# Patient Record
Sex: Female | Born: 1945 | Race: White | Hispanic: No | Marital: Married | Smoking: Current every day smoker
Health system: Southern US, Community
[De-identification: ages and names within clinical notes are randomized; demographics above are authoritative.]

## PROBLEM LIST (undated history)

## (undated) DIAGNOSIS — K219 Gastro-esophageal reflux disease without esophagitis: Secondary | ICD-10-CM

## (undated) DIAGNOSIS — J449 Chronic obstructive pulmonary disease, unspecified: Secondary | ICD-10-CM

## (undated) DIAGNOSIS — J45909 Unspecified asthma, uncomplicated: Secondary | ICD-10-CM

## (undated) DIAGNOSIS — Z9889 Other specified postprocedural states: Secondary | ICD-10-CM

## (undated) DIAGNOSIS — R42 Dizziness and giddiness: Secondary | ICD-10-CM

## (undated) DIAGNOSIS — F419 Anxiety disorder, unspecified: Secondary | ICD-10-CM

## (undated) DIAGNOSIS — I10 Essential (primary) hypertension: Secondary | ICD-10-CM

## (undated) DIAGNOSIS — C50919 Malignant neoplasm of unspecified site of unspecified female breast: Secondary | ICD-10-CM

## (undated) DIAGNOSIS — E079 Disorder of thyroid, unspecified: Secondary | ICD-10-CM

## (undated) DIAGNOSIS — R112 Nausea with vomiting, unspecified: Secondary | ICD-10-CM

## (undated) HISTORY — PX: TUBAL LIGATION: SHX77

## (undated) HISTORY — DX: Malignant neoplasm of unspecified site of unspecified female breast: C50.919

## (undated) HISTORY — PX: OTHER SURGICAL HISTORY: SHX169

## (undated) HISTORY — DX: Chronic obstructive pulmonary disease, unspecified: J44.9

## (undated) HISTORY — PX: BREAST LUMPECTOMY: SHX2

---

## 1998-08-04 ENCOUNTER — Other Ambulatory Visit: Admission: RE | Admit: 1998-08-04 | Discharge: 1998-08-04 | Payer: Self-pay | Admitting: *Deleted

## 1998-09-04 ENCOUNTER — Ambulatory Visit (HOSPITAL_COMMUNITY): Admission: RE | Admit: 1998-09-04 | Discharge: 1998-09-04 | Payer: Self-pay | Admitting: General Surgery

## 1998-09-04 ENCOUNTER — Encounter: Payer: Self-pay | Admitting: General Surgery

## 1999-08-06 ENCOUNTER — Other Ambulatory Visit: Admission: RE | Admit: 1999-08-06 | Discharge: 1999-08-06 | Payer: Self-pay | Admitting: *Deleted

## 1999-09-13 ENCOUNTER — Encounter: Payer: Self-pay | Admitting: General Surgery

## 1999-09-13 ENCOUNTER — Ambulatory Visit (HOSPITAL_COMMUNITY): Admission: RE | Admit: 1999-09-13 | Discharge: 1999-09-13 | Payer: Self-pay | Admitting: General Surgery

## 2000-07-28 ENCOUNTER — Encounter: Payer: Self-pay | Admitting: Hematology & Oncology

## 2000-07-28 ENCOUNTER — Encounter: Admission: RE | Admit: 2000-07-28 | Discharge: 2000-07-28 | Payer: Self-pay | Admitting: Hematology & Oncology

## 2000-08-13 ENCOUNTER — Other Ambulatory Visit: Admission: RE | Admit: 2000-08-13 | Discharge: 2000-08-13 | Payer: Self-pay | Admitting: *Deleted

## 2000-09-16 ENCOUNTER — Encounter: Payer: Self-pay | Admitting: General Surgery

## 2000-09-16 ENCOUNTER — Encounter: Admission: RE | Admit: 2000-09-16 | Discharge: 2000-09-16 | Payer: Self-pay | Admitting: General Surgery

## 2001-08-14 ENCOUNTER — Other Ambulatory Visit: Admission: RE | Admit: 2001-08-14 | Discharge: 2001-08-14 | Payer: Self-pay | Admitting: *Deleted

## 2001-09-21 ENCOUNTER — Encounter: Admission: RE | Admit: 2001-09-21 | Discharge: 2001-09-21 | Payer: Self-pay | Admitting: General Surgery

## 2001-09-21 ENCOUNTER — Encounter: Payer: Self-pay | Admitting: General Surgery

## 2002-08-19 ENCOUNTER — Other Ambulatory Visit: Admission: RE | Admit: 2002-08-19 | Discharge: 2002-08-19 | Payer: Self-pay | Admitting: *Deleted

## 2002-11-01 ENCOUNTER — Encounter: Payer: Self-pay | Admitting: General Surgery

## 2002-11-01 ENCOUNTER — Encounter: Admission: RE | Admit: 2002-11-01 | Discharge: 2002-11-01 | Payer: Self-pay | Admitting: General Surgery

## 2003-08-24 ENCOUNTER — Other Ambulatory Visit: Admission: RE | Admit: 2003-08-24 | Discharge: 2003-08-24 | Payer: Self-pay | Admitting: *Deleted

## 2003-11-07 ENCOUNTER — Encounter: Admission: RE | Admit: 2003-11-07 | Discharge: 2003-11-07 | Payer: Self-pay | Admitting: General Surgery

## 2004-03-30 ENCOUNTER — Encounter: Admission: RE | Admit: 2004-03-30 | Discharge: 2004-03-30 | Payer: Self-pay | Admitting: *Deleted

## 2004-09-05 ENCOUNTER — Ambulatory Visit: Payer: Self-pay | Admitting: Hematology & Oncology

## 2004-12-14 ENCOUNTER — Encounter: Admission: RE | Admit: 2004-12-14 | Discharge: 2004-12-14 | Payer: Self-pay | Admitting: General Surgery

## 2005-06-07 ENCOUNTER — Ambulatory Visit (HOSPITAL_COMMUNITY): Admission: RE | Admit: 2005-06-07 | Discharge: 2005-06-07 | Payer: Self-pay | Admitting: *Deleted

## 2005-09-17 ENCOUNTER — Ambulatory Visit: Payer: Self-pay | Admitting: Hematology & Oncology

## 2005-09-23 ENCOUNTER — Other Ambulatory Visit: Admission: RE | Admit: 2005-09-23 | Discharge: 2005-09-23 | Payer: Self-pay | Admitting: Obstetrics and Gynecology

## 2005-12-20 ENCOUNTER — Ambulatory Visit (HOSPITAL_COMMUNITY): Admission: RE | Admit: 2005-12-20 | Discharge: 2005-12-20 | Payer: Self-pay | Admitting: Gastroenterology

## 2005-12-20 ENCOUNTER — Encounter (INDEPENDENT_AMBULATORY_CARE_PROVIDER_SITE_OTHER): Payer: Self-pay | Admitting: *Deleted

## 2005-12-26 ENCOUNTER — Encounter: Admission: RE | Admit: 2005-12-26 | Discharge: 2005-12-26 | Payer: Self-pay | Admitting: General Surgery

## 2006-12-30 ENCOUNTER — Encounter: Admission: RE | Admit: 2006-12-30 | Discharge: 2006-12-30 | Payer: Self-pay | Admitting: General Surgery

## 2007-06-04 ENCOUNTER — Other Ambulatory Visit: Admission: RE | Admit: 2007-06-04 | Discharge: 2007-06-04 | Payer: Self-pay | Admitting: Obstetrics & Gynecology

## 2007-12-31 ENCOUNTER — Encounter: Admission: RE | Admit: 2007-12-31 | Discharge: 2007-12-31 | Payer: Self-pay | Admitting: Obstetrics & Gynecology

## 2008-06-08 ENCOUNTER — Other Ambulatory Visit: Admission: RE | Admit: 2008-06-08 | Discharge: 2008-06-08 | Payer: Self-pay | Admitting: Obstetrics & Gynecology

## 2009-01-03 ENCOUNTER — Encounter: Admission: RE | Admit: 2009-01-03 | Discharge: 2009-01-03 | Payer: Self-pay | Admitting: Obstetrics & Gynecology

## 2009-01-09 ENCOUNTER — Encounter: Admission: RE | Admit: 2009-01-09 | Discharge: 2009-01-09 | Payer: Self-pay | Admitting: Obstetrics & Gynecology

## 2010-01-10 ENCOUNTER — Encounter: Admission: RE | Admit: 2010-01-10 | Discharge: 2010-01-10 | Payer: Self-pay | Admitting: Obstetrics & Gynecology

## 2010-08-02 ENCOUNTER — Encounter: Admission: RE | Admit: 2010-08-02 | Discharge: 2010-08-02 | Payer: Self-pay | Admitting: Obstetrics & Gynecology

## 2010-11-04 ENCOUNTER — Encounter: Payer: Self-pay | Admitting: Obstetrics & Gynecology

## 2011-01-22 ENCOUNTER — Other Ambulatory Visit: Payer: Self-pay | Admitting: Obstetrics & Gynecology

## 2011-01-22 DIAGNOSIS — Z1231 Encounter for screening mammogram for malignant neoplasm of breast: Secondary | ICD-10-CM

## 2011-01-29 ENCOUNTER — Ambulatory Visit
Admission: RE | Admit: 2011-01-29 | Discharge: 2011-01-29 | Disposition: A | Discharge status: Home / Self Care | Payer: BC Managed Care – PPO | Source: Ambulatory Visit | Attending: Obstetrics & Gynecology | Admitting: Obstetrics & Gynecology

## 2011-01-29 DIAGNOSIS — Z1231 Encounter for screening mammogram for malignant neoplasm of breast: Secondary | ICD-10-CM

## 2011-03-01 NOTE — Op Note (Signed)
Sara Mcclure, WESTRA NO.:  [id number]   MEDICAL RECORD NO.:  [id number]          PATIENT TYPE:  AMB   LOCATION:  ENDO                         FACILITY:  [facility]   PHYSICIAN:  Anselmo Rod, M.D.  DATE OF BIRTH:  02-Jan-1946   DATE OF PROCEDURE:  12/20/2005  DATE OF DISCHARGE:                                 OPERATIVE REPORT   PROCEDURE PERFORMED:  Colonoscopy with multiple snare polypectomies and cold  biopsies.   ENDOSCOPIST:  Anselmo Rod, M.D.   INSTRUMENT USED:  Olympus video colonoscope.   INDICATION FOR PROCEDURE:  This is a 65 year old white female with a  personal history of breast cancer and rectal bleeding, undergoing a  screening colonoscopy to rule out colon polyps, masses, etc.   PREPROCEDURE PREPARATION:  Informed consent was procured from the patient.  The patient was fasted for 4 hours prior to the procedure and prepped with  Osmoprep pills the night of and in the morning of the procedure.  Risks and  benefits of the procedure including a 10% miss rate of cancer in polyps was  discussed with the patient as well.   PREPROCEDURE PHYSICAL:  VITAL SIGNS:  The patient had stable vital signs.  NECK:  Supple.  CHEST:  Clear to auscultation.  CARDIAC:  S1 and S2 regular.  ABDOMEN:  Soft with normal bowel sounds.   DESCRIPTION OF PROCEDURE:  The patient was placed in the left lateral  decubitus position and sedated with 75 mcg of fentanyl and 6 mg of Versed in  slow incremental doses.  Once the patient was adequately sedated and  maintained on low-flow oxygen and continuous cardiac monitoring, the Olympus  video colonoscope was advanced from the rectum to the cecum.  The  appendiceal orifice and the ileocecal valve were visualized and  photographed.  The terminal ileum appeared healthy and without lesions.  Nine polyps were removed from the left colon, 5 of them by snare polypectomy  (hot snare) and 4 of them by cold biopsy.  One polyp was  snared from the  rectosigmoid colon.  Two of them were biopsied (cold biopsies) from the  rectosigmoid colon as well.  Small internal hemorrhoids were seen on  retroflexion.  There was no evidence of diverticulosis.  The patient  tolerated the procedure well without immediate complications.   IMPRESSION:  1.  Multiple left colon and rectosigmoid polyps removed (see description      above).  2.  No evidence of diverticulosis.  3.  Normal-appearing terminal ileum.  4.  Small internal hemorrhoids.   RECOMMENDATIONS:  1.  Continue on a high-fiber diet with liberal fluid intake.  2.  Await pathology results.  3.  Repeat colonoscopy, depending on pathology reports.  4.  Avoid all nonsteroidals including aspirin for the next 4 weeks.  5.  Outpatient followup as need arises in the future.      Anselmo Rod, M.D.  Electronically Signed     JNM/MEDQ  D:  12/20/2005  T:  12/21/2005  Job:  [id number]   cc:   Edwena Felty. Romine, M.D.  Fax:  [phone]   Lianne Bushy, M.D.  Fax: [phone]   Rose Phi. Myna Hidalgo, M.D.  Fax: [phone]

## 2011-08-13 ENCOUNTER — Other Ambulatory Visit: Payer: Self-pay | Admitting: Obstetrics & Gynecology

## 2011-08-13 DIAGNOSIS — M8580 Other specified disorders of bone density and structure, unspecified site: Secondary | ICD-10-CM

## 2011-08-21 ENCOUNTER — Ambulatory Visit
Admission: RE | Admit: 2011-08-21 | Discharge: 2011-08-21 | Disposition: A | Discharge status: Home / Self Care | Payer: BC Managed Care – PPO | Source: Ambulatory Visit | Attending: Obstetrics & Gynecology | Admitting: Obstetrics & Gynecology

## 2011-08-21 DIAGNOSIS — M8580 Other specified disorders of bone density and structure, unspecified site: Secondary | ICD-10-CM

## 2012-01-02 ENCOUNTER — Other Ambulatory Visit: Payer: Self-pay | Admitting: Obstetrics & Gynecology

## 2012-01-02 DIAGNOSIS — Z1231 Encounter for screening mammogram for malignant neoplasm of breast: Secondary | ICD-10-CM

## 2012-01-30 ENCOUNTER — Ambulatory Visit
Admission: RE | Admit: 2012-01-30 | Discharge: 2012-01-30 | Disposition: A | Discharge status: Home / Self Care | Payer: BC Managed Care – PPO | Source: Ambulatory Visit | Attending: Obstetrics & Gynecology | Admitting: Obstetrics & Gynecology

## 2012-01-30 DIAGNOSIS — Z1231 Encounter for screening mammogram for malignant neoplasm of breast: Secondary | ICD-10-CM

## 2012-04-13 ENCOUNTER — Encounter (HOSPITAL_COMMUNITY): Payer: Self-pay | Admitting: *Deleted

## 2012-04-13 ENCOUNTER — Emergency Department (HOSPITAL_COMMUNITY): Payer: BC Managed Care – PPO

## 2012-04-13 ENCOUNTER — Observation Stay (HOSPITAL_COMMUNITY)
Admission: EM | Admit: 2012-04-13 | Discharge: 2012-04-14 | DRG: 814 | Disposition: A | Discharge status: Home / Self Care | Payer: BC Managed Care – PPO | Attending: Internal Medicine | Admitting: Internal Medicine

## 2012-04-13 DIAGNOSIS — R110 Nausea: Secondary | ICD-10-CM | POA: Diagnosis present

## 2012-04-13 DIAGNOSIS — E119 Type 2 diabetes mellitus without complications: Secondary | ICD-10-CM | POA: Diagnosis present

## 2012-04-13 DIAGNOSIS — J45909 Unspecified asthma, uncomplicated: Secondary | ICD-10-CM | POA: Diagnosis present

## 2012-04-13 DIAGNOSIS — R42 Dizziness and giddiness: Secondary | ICD-10-CM | POA: Insufficient documentation

## 2012-04-13 DIAGNOSIS — M79609 Pain in unspecified limb: Secondary | ICD-10-CM | POA: Diagnosis present

## 2012-04-13 DIAGNOSIS — R079 Chest pain, unspecified: Secondary | ICD-10-CM

## 2012-04-13 DIAGNOSIS — F419 Anxiety disorder, unspecified: Secondary | ICD-10-CM | POA: Insufficient documentation

## 2012-04-13 DIAGNOSIS — R9439 Abnormal result of other cardiovascular function study: Secondary | ICD-10-CM | POA: Diagnosis present

## 2012-04-13 DIAGNOSIS — R109 Unspecified abdominal pain: Principal | ICD-10-CM | POA: Diagnosis present

## 2012-04-13 DIAGNOSIS — F17200 Nicotine dependence, unspecified, uncomplicated: Secondary | ICD-10-CM | POA: Diagnosis present

## 2012-04-13 DIAGNOSIS — Z720 Tobacco use: Secondary | ICD-10-CM | POA: Diagnosis present

## 2012-04-13 DIAGNOSIS — Z853 Personal history of malignant neoplasm of breast: Secondary | ICD-10-CM

## 2012-04-13 DIAGNOSIS — I2510 Atherosclerotic heart disease of native coronary artery without angina pectoris: Secondary | ICD-10-CM | POA: Diagnosis present

## 2012-04-13 DIAGNOSIS — R197 Diarrhea, unspecified: Secondary | ICD-10-CM | POA: Diagnosis present

## 2012-04-13 DIAGNOSIS — K219 Gastro-esophageal reflux disease without esophagitis: Secondary | ICD-10-CM | POA: Insufficient documentation

## 2012-04-13 DIAGNOSIS — I10 Essential (primary) hypertension: Secondary | ICD-10-CM | POA: Diagnosis present

## 2012-04-13 DIAGNOSIS — M79602 Pain in left arm: Secondary | ICD-10-CM | POA: Diagnosis present

## 2012-04-13 HISTORY — DX: Gastro-esophageal reflux disease without esophagitis: K21.9

## 2012-04-13 HISTORY — DX: Anxiety disorder, unspecified: F41.9

## 2012-04-13 HISTORY — DX: Dizziness and giddiness: R42

## 2012-04-13 HISTORY — DX: Unspecified asthma, uncomplicated: J45.909

## 2012-04-13 HISTORY — DX: Disorder of thyroid, unspecified: E07.9

## 2012-04-13 HISTORY — DX: Essential (primary) hypertension: I10

## 2012-04-13 LAB — GLUCOSE, CAPILLARY: Glucose-Capillary: 101 mg/dL — ABNORMAL HIGH (ref 70–99)

## 2012-04-13 LAB — COMPREHENSIVE METABOLIC PANEL
ALT: 20 U/L (ref 0–35)
AST: 19 U/L (ref 0–37)
CO2: 22 mEq/L (ref 19–32)
Chloride: 105 mEq/L (ref 96–112)
Creatinine, Ser: 0.65 mg/dL (ref 0.50–1.10)
GFR calc non Af Amer: >90 mL/min (ref >90)
Glucose, Bld: 113 mg/dL — ABNORMAL HIGH (ref 70–99)
Sodium: 141 mEq/L (ref 135–145)
Total Bilirubin: 0.4 mg/dL (ref 0.3–1.2)

## 2012-04-13 LAB — CBC WITH DIFFERENTIAL/PLATELET
Basophils Absolute: 0.1 10*3/uL (ref 0.0–0.1)
HCT: 43.7 % (ref 36.0–46.0)
Lymphocytes Relative: 17 % (ref 12–46)
Lymphs Abs: 2 10*3/uL (ref 0.7–4.0)
Monocytes Absolute: 0.9 10*3/uL (ref 0.1–1.0)
Neutro Abs: 8.8 10*3/uL — ABNORMAL HIGH (ref 1.7–7.7)
RBC: 5 MIL/uL (ref 3.87–5.11)
RDW: 13 % (ref 11.5–15.5)
WBC: 11.7 10*3/uL — ABNORMAL HIGH (ref 4.0–10.5)

## 2012-04-13 LAB — CARDIAC PANEL(CRET KIN+CKTOT+MB+TROPI)
CK, MB: 3.9 ng/mL (ref 0.3–4.0)
Relative Index: 2.5 (ref 0.0–2.5)
Total CK: 156 U/L (ref 7–177)

## 2012-04-13 LAB — LACTIC ACID, PLASMA: Lactic Acid, Venous: 0.9 mmol/L (ref 0.5–2.2)

## 2012-04-13 MED ORDER — SODIUM CHLORIDE 0.9 % IV SOLN
INTRAVENOUS | Status: DC
  Administered 2012-04-13 – 2012-04-14 (×2): via INTRAVENOUS

## 2012-04-13 MED ORDER — ONDANSETRON HCL 4 MG/2ML IJ SOLN
4.0000 mg | Freq: Once | INTRAMUSCULAR | Status: AC
  Administered 2012-04-13: 4 mg via INTRAVENOUS
  Filled 2012-04-13: qty 2

## 2012-04-13 MED ORDER — ENOXAPARIN SODIUM 80 MG/0.8ML ~~LOC~~ SOLN
1.0000 mg/kg | Freq: Two times a day (BID) | SUBCUTANEOUS | Status: DC
  Administered 2012-04-13: 70 mg via SUBCUTANEOUS
  Filled 2012-04-13 (×3): qty 0.8

## 2012-04-13 MED ORDER — NITROGLYCERIN 0.4 MG SL SUBL: 0.4000 mg | SUBLINGUAL_TABLET | SUBLINGUAL | Status: DC | PRN

## 2012-04-13 MED ORDER — ACETAMINOPHEN 325 MG PO TABS: 650.0000 mg | ORAL_TABLET | ORAL | Status: DC | PRN

## 2012-04-13 MED ORDER — ASPIRIN 81 MG PO CHEW
324.0000 mg | CHEWABLE_TABLET | ORAL | Status: AC
  Administered 2012-04-14: 324 mg via ORAL
  Filled 2012-04-13: qty 4

## 2012-04-13 MED ORDER — SODIUM CHLORIDE 0.9 % IJ SOLN: 3.0000 mL | INTRAMUSCULAR | Status: DC | PRN

## 2012-04-13 MED ORDER — PANTOPRAZOLE SODIUM 40 MG IV SOLR
40.0000 mg | Freq: Once | INTRAVENOUS | Status: AC
  Administered 2012-04-13: 40 mg via INTRAVENOUS
  Filled 2012-04-13: qty 40

## 2012-04-13 MED ORDER — SODIUM CHLORIDE 0.9 % IV SOLN: 250.0000 mL | INTRAVENOUS | Status: DC | PRN

## 2012-04-13 MED ORDER — ATORVASTATIN CALCIUM 10 MG PO TABS
10.0000 mg | ORAL_TABLET | Freq: Every day | ORAL | Status: DC
  Filled 2012-04-13 (×2): qty 1

## 2012-04-13 MED ORDER — METOPROLOL TARTRATE 12.5 MG HALF TABLET
12.5000 mg | ORAL_TABLET | Freq: Two times a day (BID) | ORAL | Status: DC
  Administered 2012-04-13 – 2012-04-14 (×2): 12.5 mg via ORAL
  Filled 2012-04-13 (×3): qty 1

## 2012-04-13 MED ORDER — ASPIRIN EC 81 MG PO TBEC
81.0000 mg | DELAYED_RELEASE_TABLET | Freq: Every day | ORAL | Status: DC
  Administered 2012-04-14: 81 mg via ORAL
  Filled 2012-04-13: qty 1

## 2012-04-13 MED ORDER — MONTELUKAST SODIUM 10 MG PO TABS
10.0000 mg | ORAL_TABLET | Freq: Every day | ORAL | Status: DC
  Administered 2012-04-13: 10 mg via ORAL
  Filled 2012-04-13 (×2): qty 1

## 2012-04-13 MED ORDER — FLUTICASONE-SALMETEROL 250-50 MCG/DOSE IN AEPB
1.0000 | INHALATION_SPRAY | Freq: Two times a day (BID) | RESPIRATORY_TRACT | Status: DC
  Administered 2012-04-13 – 2012-04-14 (×2): 1 via RESPIRATORY_TRACT
  Filled 2012-04-13: qty 14

## 2012-04-13 MED ORDER — LEVOTHYROXINE SODIUM 50 MCG PO TABS
50.0000 ug | ORAL_TABLET | Freq: Every day | ORAL | Status: DC
Start: 2012-04-14 — End: 2012-04-14
  Administered 2012-04-14: 50 ug via ORAL
  Filled 2012-04-13 (×2): qty 1

## 2012-04-13 MED ORDER — INSULIN ASPART 100 UNIT/ML ~~LOC~~ SOLN: 0.0000 [IU] | Freq: Three times a day (TID) | SUBCUTANEOUS | Status: DC

## 2012-04-13 MED ORDER — ONDANSETRON HCL 4 MG/2ML IJ SOLN
4.0000 mg | Freq: Four times a day (QID) | INTRAMUSCULAR | Status: DC | PRN
  Administered 2012-04-13 – 2012-04-14 (×2): 4 mg via INTRAVENOUS
  Filled 2012-04-13 (×2): qty 2

## 2012-04-13 MED ORDER — LISINOPRIL 10 MG PO TABS
10.0000 mg | ORAL_TABLET | Freq: Every day | ORAL | Status: DC
  Administered 2012-04-14: 10 mg via ORAL
  Filled 2012-04-13: qty 1

## 2012-04-13 MED ORDER — SODIUM CHLORIDE 0.9 % IJ SOLN: 3.0000 mL | Freq: Two times a day (BID) | INTRAMUSCULAR | Status: DC

## 2012-04-13 MED ORDER — VITAMIN D3 25 MCG (1000 UNIT) PO TABS
2000.0000 [IU] | ORAL_TABLET | Freq: Two times a day (BID) | ORAL | Status: DC
  Administered 2012-04-13 – 2012-04-14 (×2): 2000 [IU] via ORAL
  Filled 2012-04-13 (×3): qty 2

## 2012-04-13 MED ORDER — SODIUM CHLORIDE 0.9 % IV BOLUS (SEPSIS)
1000.0000 mL | Freq: Once | INTRAVENOUS | Status: AC
  Administered 2012-04-13: 1000 mL via INTRAVENOUS

## 2012-04-13 MED ORDER — IOHEXOL 300 MG/ML  SOLN: 20.0000 mL | INTRAMUSCULAR | Status: DC

## 2012-04-13 MED ORDER — IOHEXOL 350 MG/ML SOLN
100.0000 mL | Freq: Once | INTRAVENOUS | Status: AC | PRN
  Administered 2012-04-13: 100 mL via INTRAVENOUS

## 2012-04-13 NOTE — Consult Note (Signed)
Pt. Seen and examined. Agree with the NP/PA-C note as written.  66 yo female who is new to our practice. She had a stress test ordered by Dr. Ricki Miller (although she says she had no chest pain symptoms and a "normal" EKG in his office). This was a Bruce treadmill stress test which showed ~79mm of inferior ST depression with exercise. She was dyspneic during the stress test. She now presents with left arm pain and pain under both ribs. The abdomen is tender to the touch, there is nausea and diarrhea and she has an elevated WBC count but no fever.  There are no acute ischemic EKG changes. Although the stress test was technically positive, I'm not convinced this is unstable angina.  I would recommend further work-up and treatment of her abdominal pain. She should undergo a cardiac catheterization at some point, but not necessarily during this hospitalization if her cardiac enzymes are negative.  Will follow along as consultants during her hospitalization.  Chrystie Nose, MD, [facility] Attending Cardiologist [facility]

## 2012-04-13 NOTE — ED Provider Notes (Signed)
History     CSN: [id number]  Arrival date & time 04/13/12  1234   First MD Initiated Contact with Patient 04/13/12 1249      Chief Complaint  Patient presents with  . Abdominal Pain    (Consider location/radiation/quality/duration/timing/severity/associated sxs/prior treatment) HPI Comments: Patient is difficult historian presents with upper, pain is constant for the past 2-3 weeks that she attributes to reflux. Associated with belching and nausea but no vomiting. She denies any chest pain, shortness of breath or lower abdominal pain. EMS was called today because patient is experiencing pain in her left arm. She states she always has pain in her left upper arm which she used believes is secondary to previous radiation. She became concerned today when she had pain extending into her lower arm lasting for about an hour. It is now resolved. Notably she had a stress test 3 days ago but is not know her that the results. She reports no cardiac history and had no chest pain or shortness of breath. She denies any weakness, numbness, tingling or headache. She reports 3 episodes of loose stools in the past few days which is normally constipated.  The history is provided by the patient and the EMS personnel.    Past Medical History  Diagnosis Date  . GERD (gastroesophageal reflux disease)   . Asthma   . Hypertension   . Diabetes mellitus   . Thyroid disease   . Anxiety   . Vertigo     History reviewed. No pertinent past surgical history.  History reviewed. No pertinent family history.  History  Substance Use Topics  . Smoking status: Current Everyday Smoker -- 1.0 packs/day for 50 years    Types: Cigarettes  . Smokeless tobacco: Not on file  . Alcohol Use: No    OB History    Grav Para Term Preterm Abortions TAB SAB Ect Mult Living                  Review of Systems  Constitutional: Positive for activity change and appetite change. Negative for fever.  HENT: Negative for  congestion and rhinorrhea.   Respiratory: Negative for cough, chest tightness and shortness of breath.   Cardiovascular: Negative for chest pain.  Gastrointestinal: Positive for nausea, abdominal pain and diarrhea. Negative for vomiting and constipation.  Genitourinary: Negative for dysuria and hematuria.  Musculoskeletal: Positive for myalgias and arthralgias.  Skin: Negative for rash.  Neurological: Positive for weakness. Negative for dizziness and headaches.    Allergies  Review of patient's allergies indicates not on file.  Home Medications   Current Outpatient Rx  Name Route Sig Dispense Refill  . ASPIRIN EC 81 MG PO TBEC Oral Take 81 mg by mouth daily.    Marland Kitchen VITAMIN D 1000 UNITS PO TABS Oral Take 2,000 Units by mouth 2 (two) times daily.    Marland Kitchen FLUTICASONE-SALMETEROL 250-50 MCG/DOSE IN AEPB Inhalation Inhale 1 puff into the lungs every 12 (twelve) hours.    . FOSINOPRIL SODIUM 10 MG PO TABS Oral Take 10 mg by mouth daily.    Marland Kitchen LEVOTHYROXINE SODIUM 50 MCG PO TABS Oral Take 50 mcg by mouth daily.    Marland Kitchen METFORMIN HCL 500 MG PO TABS Oral Take 500 mg by mouth every evening.    Marland Kitchen MONTELUKAST SODIUM 10 MG PO TABS Oral Take 10 mg by mouth at bedtime.      BP 142/80  Temp 98.4 F (36.9 C) (Oral)  Resp 20  SpO2 97%  Physical  Exam  Constitutional: She is oriented to person, place, and time. She appears well-developed and well-nourished. No distress.  HENT:  Head: Normocephalic and atraumatic.  Mouth/Throat: Oropharynx is clear and moist.  Eyes: Conjunctivae are normal. Pupils are equal, round, and reactive to light.  Neck: Normal range of motion. Neck supple.  Cardiovascular: Normal rate, regular rhythm and normal heart sounds.   No murmur heard. Pulmonary/Chest: Effort normal and breath sounds normal. No respiratory distress.  Abdominal: Soft. There is tenderness. There is no rebound and no guarding.       Tender To palpation left upper quadrant, epigastric area, right upper  quadrant  Musculoskeletal: Normal range of motion. She exhibits no edema and no tenderness.       FROM L arm with pain, no cellulitis.  Neurological: She is alert and oriented to person, place, and time. No cranial nerve deficit.  Skin: Skin is warm.    ED Course  Procedures (including critical care time)  Labs Reviewed  CBC WITH DIFFERENTIAL - Abnormal; Notable for the following:    WBC 11.7 (*)     Hemoglobin 15.1 (*)     Neutro Abs 8.8 (*)     All other components within normal limits  COMPREHENSIVE METABOLIC PANEL - Abnormal; Notable for the following:    Glucose, Bld 113 (*)     Calcium 10.6 (*)     All other components within normal limits  CARDIAC PANEL(CRET KIN+CKTOT+MB+TROPI) - Abnormal; Notable for the following:    Relative Index 2.8 (*)     All other components within normal limits  LIPASE, BLOOD  LACTIC ACID, PLASMA   No results found.   No diagnosis found.    MDM  Upper abdominal pain with nausea and diarrhea. This is a chronic issue for the patient. EMS was called for her left upper extremity pain which is now resolved.  Discussed with [facility] who found patient's stress test and it was determined to be abnormal.  Concerning for angina equivalent in setting of L arm pain. [facility] will consult on patient.  Upper abdominal pain and nausea appear to be chronic issue for patient.  Consider gastritis, GERD, PUD, pancreatitis. Will obtain abdominal labs and CT.  More concerning is patient's arm pain and abnormal stress test.  She will need cardiac workup. No acute abnormality on CT.   Date: 04/13/2012  Rate: 83  Rhythm: normal sinus rhythm  QRS Axis: normal  Intervals: normal  ST/T Wave abnormalities: normal  Conduction Disutrbances:none  Narrative Interpretation:   Old EKG Reviewed: none available       Glynn Octave, MD 04/13/12 1857

## 2012-04-13 NOTE — ED Notes (Signed)
IV site dressing changed at pt request. Pt appears anxious and symptoms increase when family in room. Pt advised of CT results.

## 2012-04-13 NOTE — ED Notes (Signed)
Heart Healthy diet ordered spoke with Phylicia

## 2012-04-13 NOTE — ED Notes (Signed)
Pt giving medication list and last taken to pharmacy tech at this time. Pt has hx of anxiety and does appear anxious at this time. Pt states she was called by her PCP on Friday she was diabetic and was started on Metformin BID that day. Pt reports taking Prevacid for GERD over the last week. She went to PCP on Wednesday for the abd pain and was told her s/s were consistent with her GERD. Pt does have relief with her Prevacid. Pt denies vomiting since 2 weeks ago. Pt has hx of vertigo and reports last vomiting was due to car sickness. Pt husband states pt is having more reflux than usual.

## 2012-04-13 NOTE — Consult Note (Signed)
Reason for Consult:  Left arm pain Referring Physician:   ANE CONERLY is an 66 y.o. female.  HPI:   Patient is a 66 year old obese Caucasian female with history of breast cancer and radiation therapy 17 years ago, diabetes mellitus new onset, asthma, hypertension, hyperlipidemia, hypothyroidism, continued tobacco abuse 50 pack year history.  Last Friday on 04/10/2012 patient patient underwent a treadmill exercise stress test using Bruce Bruce protocol. The test was positive. 2 nonspecific ST and T wave changes as well as dyspnea and LVH. In the test was further recommended to delineate the cause of her dyspnea.    The patient presents today with left arm pain as well as upper abdominal pressure. She states she's had abdominal pressure now for 2 weeks. She rates it as a 6/10. She states completely eases off when she sits straight up or stands up.  The abdominal pain is worse after eating.  She also reports acid reflux, diaphoresis and chills over the weekend as well as rapid heart rate, dizziness and nausea, shortness of breath as well as diarrhea over the weekend.  She denies cough, congestion, lower extremity edema, dysuriaa hematuriaa hematochezia, melena.  Past Medical History  Diagnosis Date  . GERD (gastroesophageal reflux disease)   . Asthma   . Hypertension   . Diabetes mellitus   . Thyroid disease   . Anxiety   . Vertigo     History reviewed. No pertinent past surgical history.  History reviewed. No pertinent family history.  Social History:  reports that she has been smoking Cigarettes.  She has a 50 pack-year smoking history. She does not have any smokeless tobacco history on file. She reports that she does not drink alcohol or use illicit drugs.  Allergies: Not on File  Medications:   Aspirin 81 mg daily, vitamin D 2000 units twice daily, Advair 250/50 mcg per dose one puff every 12 hours, Monopril 10 mg daily, levothyroxine 50 mcg daily, metformin 500 mg every evening,  Singulair 10 mg at bedtime.  Results for orders placed during the hospital encounter of 04/13/12 (from the past 48 hour(s))  CBC WITH DIFFERENTIAL     Status: Abnormal   Collection Time   04/13/12  1:28 PM      Component Value Range Comment   WBC 11.7 (*) 4.0 - 10.5 K/uL    RBC 5.00  3.87 - 5.11 MIL/uL    Hemoglobin 15.1 (*) 12.0 - 15.0 g/dL    HCT 16.1  09.6 - 04.5 %    MCV 87.4  78.0 - 100.0 fL    MCH 30.2  26.0 - 34.0 pg    MCHC 34.6  30.0 - 36.0 g/dL    RDW 40.9  81.1 - 91.4 %    Platelets 213  150 - 400 K/uL    Neutrophils Relative 75  43 - 77 %    Neutro Abs 8.8 (*) 1.7 - 7.7 K/uL    Lymphocytes Relative 17  12 - 46 %    Lymphs Abs 2.0  0.7 - 4.0 K/uL    Monocytes Relative 7  3 - 12 %    Monocytes Absolute 0.9  0.1 - 1.0 K/uL    Eosinophils Relative 0  0 - 5 %    Eosinophils Absolute 0.1  0.0 - 0.7 K/uL    Basophils Relative 0  0 - 1 %    Basophils Absolute 0.1  0.0 - 0.1 K/uL   COMPREHENSIVE METABOLIC PANEL     Status:  Abnormal   Collection Time   04/13/12  1:28 PM      Component Value Range Comment   Sodium 141  135 - 145 mEq/L    Potassium 3.5  3.5 - 5.1 mEq/L    Chloride 105  96 - 112 mEq/L    CO2 22  19 - 32 mEq/L    Glucose, Bld 113 (*) 70 - 99 mg/dL    BUN 7  6 - 23 mg/dL    Creatinine, Ser 4.09  0.50 - 1.10 mg/dL    Calcium 81.1 (*) 8.4 - 10.5 mg/dL    Total Protein 7.2  6.0 - 8.3 g/dL    Albumin 3.9  3.5 - 5.2 g/dL    AST 19  0 - 37 U/L    ALT 20  0 - 35 U/L    Alkaline Phosphatase 72  39 - 117 U/L    Total Bilirubin 0.4  0.3 - 1.2 mg/dL    GFR calc non Af Amer >90  >90 mL/min    GFR calc Af Amer >90  >90 mL/min   CARDIAC PANEL(CRET KIN+CKTOT+MB+TROPI)     Status: Abnormal   Collection Time   04/13/12  1:28 PM      Component Value Range Comment   Total CK 109  7 - 177 U/L    CK, MB 3.1  0.3 - 4.0 ng/mL    Troponin I <0.30  <0.30 ng/mL    Relative Index 2.8 (*) 0.0 - 2.5   LIPASE, BLOOD     Status: Normal   Collection Time   04/13/12  1:28 PM       Component Value Range Comment   Lipase 27  11 - 59 U/L   LACTIC ACID, PLASMA     Status: Normal   Collection Time   04/13/12  1:28 PM      Component Value Range Comment   Lactic Acid, Venous 0.9  0.5 - 2.2 mmol/L     No results found.  Review of Systems  Constitutional: Positive for chills and diaphoresis. Negative for fever.  HENT: Negative for congestion and sore throat.   Respiratory: Positive for shortness of breath. Negative for cough and wheezing.   Cardiovascular: Positive for palpitations and claudication. Negative for chest pain, orthopnea, leg swelling and PND.  Gastrointestinal: Positive for nausea, vomiting, abdominal pain and diarrhea. Negative for constipation, blood in stool and melena.  Genitourinary: Negative for dysuria and hematuria.  Musculoskeletal: Positive for myalgias (Left forearm pain.).  Neurological: Negative for dizziness.   Blood pressure 142/80, temperature 98.4 F (36.9 C), temperature source Oral, resp. rate 20, SpO2 97.00%. Physical Exam  Constitutional: She is oriented to person, place, and time. She appears well-developed. No distress.  HENT:  Head: Normocephalic and atraumatic.  Mouth/Throat: No oropharyngeal exudate.  Eyes: EOM are normal. Pupils are equal, round, and reactive to light. No scleral icterus.  Neck: Normal range of motion. Neck supple. No JVD present.  Cardiovascular: Normal rate and regular rhythm.   No murmur heard. Pulses:      Carotid pulses are on the left side with bruit.      Radial pulses are 2+ on the right side, and 2+ on the left side.       Dorsalis pedis pulses are 2+ on the right side, and 2+ on the left side.       No femoral bruit.  Respiratory: Effort normal.       Course BS which cleared with cough.  GI: Soft. There is tenderness (6/10 RUQ pain. and 8/10 epigastric to LUQ pain ).  Musculoskeletal: She exhibits no edema.       Left arm is nontender with palpation or passive and active motion.    Lymphadenopathy:    She has no cervical adenopathy.  Neurological: She is alert and oriented to person, place, and time. She exhibits normal muscle tone.  Skin: Skin is warm and dry.  Psychiatric: She has a normal mood and affect.    Assessment/Plan: Patient Active Hospital Problem List:  Abdominal pain (04/13/2012) Positive cardiac stress test, 04/10/12 (04/13/2012) Left arm pain, forearm (04/13/2012) Tobacco abuse (04/13/2012) HTN (hypertension) (04/13/2012) Diabetes mellitus (04/13/2012) History of breast cancer: 17 yrs ago. (04/13/2012)  Plan:  The patient will need a left heart cath after her abdominal pain has been worked up.  She had a recent positive Bruce protocol stress test and is a longtime smoker.  She may have cholecystitis/gall stone pancreatitis.  Recommend lipase and amylase.  CT abdomen and pelvis and CTA of chest are pending.  Giving the contrast load, we need to let her kidneys cool off prior to cath.   Quentyn Kolbeck 04/13/2012, 3:38 PM

## 2012-04-13 NOTE — ED Provider Notes (Signed)
  Physical Exam  BP 130/75  Pulse 84  Temp 98.4 F (36.9 C) (Oral)  Resp 20  SpO2 96%  Physical Exam  ED Course  Procedures  MDM Received patient in signout pending results of CTs. CTs were negative. Patient will be admitted by cardiology for heart cath tomorrow.      Juliet Rude. Rubin Payor, MD 04/13/12 1842

## 2012-04-13 NOTE — ED Notes (Signed)
C/o intermittent LUQ x 2 weeks, past 3 days reports n/v/d. Given zofran 4mg  IV by EMS enroute

## 2012-04-14 ENCOUNTER — Encounter (HOSPITAL_COMMUNITY)
Admission: EM | Disposition: A | Discharge status: Home / Self Care | Payer: Self-pay | Source: Home / Self Care | Attending: Emergency Medicine

## 2012-04-14 HISTORY — PX: LEFT HEART CATHETERIZATION WITH CORONARY ANGIOGRAM: SHX5451

## 2012-04-14 HISTORY — PX: ABDOMINAL ANGIOGRAM: SHX5499

## 2012-04-14 LAB — GLUCOSE, CAPILLARY
Glucose-Capillary: 96 mg/dL (ref 70–99)
Glucose-Capillary: 91 mg/dL (ref 70–99)
Glucose-Capillary: 82 mg/dL (ref 70–99)

## 2012-04-14 LAB — BASIC METABOLIC PANEL
Chloride: 109 mEq/L (ref 96–112)
Creatinine, Ser: 0.68 mg/dL (ref 0.50–1.10)
GFR calc Af Amer: >90 mL/min (ref >90)
GFR calc non Af Amer: 90 mL/min — ABNORMAL LOW (ref >90)
Potassium: 3.9 mEq/L (ref 3.5–5.1)

## 2012-04-14 LAB — LIPID PANEL
Cholesterol: 202 mg/dL — ABNORMAL HIGH (ref 0–200)
Triglycerides: 118 mg/dL (ref <150)

## 2012-04-14 LAB — CARDIAC PANEL(CRET KIN+CKTOT+MB+TROPI)
Relative Index: 2.1 (ref 0.0–2.5)
Total CK: 155 U/L (ref 7–177)
Total CK: 174 U/L (ref 7–177)
Troponin I: <0.3 ng/mL (ref <0.30)

## 2012-04-14 LAB — TSH: TSH: 1.313 u[IU]/mL (ref 0.350–4.500)

## 2012-04-14 LAB — PROTIME-INR: Prothrombin Time: 13.9 seconds (ref 11.6–15.2)

## 2012-04-14 SURGERY — LEFT HEART CATHETERIZATION WITH CORONARY ANGIOGRAM
Anesthesia: LOCAL

## 2012-04-14 MED ORDER — ONDANSETRON HCL 4 MG/2ML IJ SOLN: 4.0000 mg | Freq: Four times a day (QID) | INTRAMUSCULAR | Status: DC | PRN

## 2012-04-14 MED ORDER — MIDAZOLAM HCL 2 MG/2ML IJ SOLN
INTRAMUSCULAR | Status: AC
  Filled 2012-04-14: qty 2

## 2012-04-14 MED ORDER — LIDOCAINE HCL (PF) 1 % IJ SOLN
INTRAMUSCULAR | Status: AC
  Filled 2012-04-14: qty 30

## 2012-04-14 MED ORDER — ACETAMINOPHEN 325 MG PO TABS: 650.0000 mg | ORAL_TABLET | ORAL | Status: DC | PRN

## 2012-04-14 MED ORDER — METFORMIN HCL 500 MG PO TABS: 500.0000 mg | ORAL_TABLET | Freq: Every evening | ORAL | Status: DC

## 2012-04-14 MED ORDER — FENTANYL CITRATE 0.05 MG/ML IJ SOLN
INTRAMUSCULAR | Status: AC
  Filled 2012-04-14: qty 2

## 2012-04-14 MED ORDER — ATORVASTATIN CALCIUM 20 MG PO TABS: 20.0000 mg | ORAL_TABLET | Freq: Every day | ORAL | Status: DC

## 2012-04-14 MED ORDER — ATORVASTATIN CALCIUM 20 MG PO TABS
20.0000 mg | ORAL_TABLET | Freq: Every day | ORAL | Status: DC
  Filled 2012-04-14: qty 1

## 2012-04-14 MED ORDER — NITROGLYCERIN 0.2 MG/ML ON CALL CATH LAB
INTRAVENOUS | Status: AC
  Filled 2012-04-14: qty 1

## 2012-04-14 MED ORDER — SODIUM CHLORIDE 0.9 % IV SOLN: INTRAVENOUS | Status: DC

## 2012-04-14 MED ORDER — HEPARIN (PORCINE) IN NACL 2-0.9 UNIT/ML-% IJ SOLN
INTRAMUSCULAR | Status: AC
  Filled 2012-04-14: qty 2000

## 2012-04-14 MED ORDER — OMEPRAZOLE 20 MG PO CPDR: 20.0000 mg | DELAYED_RELEASE_CAPSULE | Freq: Every day | ORAL | Status: DC

## 2012-04-14 NOTE — CV Procedure (Signed)
Sara Mcclure is a 66 y.o. female    [id number]  [id number] LOCATION:  FACILITY: [facility]  PHYSICIAN: Nanetta Batty, M.D. [birth year]   DATE OF PROCEDURE:  04/14/2012  DATE OF DISCHARGE:  [facility]  CARDIAC CATHETERIZATION     History obtained from the patient.   PROCEDURE DESCRIPTION:   The patient was brought to the second floor  [facility]iac cath lab in the postabsorptive state. She was premedicated with 2 mg versed and 25 micrograms of fentanyl. Her right femoral artery was prepped and shaved in usual sterile fashion. Xylocaine 1% was used  for local anesthesia. A 5  French sheath was inserted into the Right FA. 5 Fr JL4 and JR4 and pigtail catheters were used.    HEMODYNAMICS:    AO SYSTOLIC/AO DIASTOLIC: 128/60   LV SYSTOLIC/LV DIASTOLIC: 128/13  ANGIOGRAPHIC RESULTS:   1. Left main: Nl  2. LAD: mild luminal irregularity of 10 to <20% proximally 3. Left circumflex: Nl  4. Right coronary artery: Nl dominant vessel     5 .  Left ventriculography; RAO left ventriculogram was performed using  25 mL of Visipaque dye at 12 mL/second. The overall LVEF estimated  60 - 65 % Without wall motion abnormalities.  6.  Distal AortographyL: 10 - 20% right renal artery narrowing; mild luminal irregularity distal aorta.    IMPRESSION:  Normal LV function                           Very mild nonobstructive CAD.  REC:  Medical therapy with smoking cessation, lifestyle modication.  Pt tolerated well.  Lennette Bihari, MD, [facility] 04/14/2012 2:10 PM

## 2012-04-14 NOTE — Care Management Note (Unsigned)
    Page 1 of 1   04/14/2012     11:34:15 AM   CARE MANAGEMENT NOTE 04/14/2012  Patient:  Sara Mcclure, Sara Mcclure   Account Number:  [id number]  Date Initiated:  04/14/2012  Documentation initiated by:  SIMMONS,Tyrisha Benninger  Subjective/Objective Assessment:   ADMITTED WITH N/V/ LEFT ARM PAIN; LIVES AT HOME WITH HUSBAND; WAS IPTA.     Action/Plan:   DISCHARGE PLANNING.   Anticipated DC Date:  04/15/2012   Anticipated DC Plan:  HOME/SELF CARE      DC Planning Services  CM consult      Choice offered to / List presented to:             Status of service:  In process, will continue to follow Medicare Important Message given?   (If response is "NO", the following Medicare IM given date fields will be blank) Date Medicare IM given:   Date Additional Medicare IM given:    Discharge Disposition:    Per UR Regulation:  Reviewed for med. necessity/level of care/duration of stay  If discussed at Long Length of Stay Meetings, dates discussed:    Comments:  04/14/12  1133  Steward Sames SIMMONS RN, BSN [phone] FOR CARDIAC CATH TODAY.  NCM WILL FOLLOW.

## 2012-04-14 NOTE — Discharge Summary (Signed)
Patient ID: Sara Mcclure,  MRN: [id number], DOB/AGE: 1946-01-08 66 y.o.  Admit date: 04/13/2012 Discharge date: 04/14/2012  Primary Care Provider: Dr Ricki Miller Primary Cardiologist: Dr Rennis Golden  Discharge Diagnoses Principal Problem:  *Abdominal pain Active Problems:  Positive cardiac stress test, 04/10/12  Left arm pain, forearm  Tobacco abuse  HTN (hypertension)  Diabetes mellitus  History of breast cancer: 17 yrs ago.  Minor CAD, (10-20% LAD) at cath 04/14/12   Procedures: Cardiac cath 04/14/12   Hospital Course: 66 yo female who is new to our practice. She had a stress test ordered by Dr. Ricki Miller (although she says she had no chest pain symptoms and a "normal" EKG in his office). This was a Bruce treadmill stress test which showed ~61mm of inferior ST depression with exercise. She was dyspneic during the stress test. She now presents with left arm pain and pain under both ribs. The abdomen is tender to the touch, there is nausea and diarrhea and she has an elevated WBC count but no fever. There are no acute ischemic EKG changes. Although the stress test was technically positive, Dr Rennis Golden was not convinced this was unstable angina. He  recommended further work-up and treatment of her abdominal pain. She underwent diagnostic cath 04/14/12 which showed minor CAD. Dr Tresa Endo felt she could be discharge late on the 2nd. She will see Wilburt Finlay in a week. PPI was added.   Discharge Vitals:  Blood pressure 135/76, pulse 62, temperature 98.2 F (36.8 C), temperature source Oral, resp. rate 20, height 5' 0.6" (1.539 m), weight 72 kg (158 lb 11.7 oz), SpO2 96.00%.    Labs: Results for orders placed during the hospital encounter of 04/13/12 (from the past 48 hour(s))  CBC WITH DIFFERENTIAL     Status: Abnormal   Collection Time   04/13/12  1:28 PM      Component Value Range Comment   WBC 11.7 (*) 4.0 - 10.5 K/uL    RBC 5.00  3.87 - 5.11 MIL/uL    Hemoglobin 15.1 (*) 12.0 - 15.0 g/dL    HCT 40.9  81.1 -  91.4 %    MCV 87.4  78.0 - 100.0 fL    MCH 30.2  26.0 - 34.0 pg    MCHC 34.6  30.0 - 36.0 g/dL    RDW 78.2  95.6 - 21.3 %    Platelets 213  150 - 400 K/uL    Neutrophils Relative 75  43 - 77 %    Neutro Abs 8.8 (*) 1.7 - 7.7 K/uL    Lymphocytes Relative 17  12 - 46 %    Lymphs Abs 2.0  0.7 - 4.0 K/uL    Monocytes Relative 7  3 - 12 %    Monocytes Absolute 0.9  0.1 - 1.0 K/uL    Eosinophils Relative 0  0 - 5 %    Eosinophils Absolute 0.1  0.0 - 0.7 K/uL    Basophils Relative 0  0 - 1 %    Basophils Absolute 0.1  0.0 - 0.1 K/uL   COMPREHENSIVE METABOLIC PANEL     Status: Abnormal   Collection Time   04/13/12  1:28 PM      Component Value Range Comment   Sodium 141  135 - 145 mEq/L    Potassium 3.5  3.5 - 5.1 mEq/L    Chloride 105  96 - 112 mEq/L    CO2 22  19 - 32 mEq/L    Glucose, Bld 113 (*)  70 - 99 mg/dL    BUN 7  6 - 23 mg/dL    Creatinine, Ser 2.95  0.50 - 1.10 mg/dL    Calcium 28.4 (*) 8.4 - 10.5 mg/dL    Total Protein 7.2  6.0 - 8.3 g/dL    Albumin 3.9  3.5 - 5.2 g/dL    AST 19  0 - 37 U/L    ALT 20  0 - 35 U/L    Alkaline Phosphatase 72  39 - 117 U/L    Total Bilirubin 0.4  0.3 - 1.2 mg/dL    GFR calc non Af Amer >90  >90 mL/min    GFR calc Af Amer >90  >90 mL/min   CARDIAC PANEL(CRET KIN+CKTOT+MB+TROPI)     Status: Abnormal   Collection Time   04/13/12  1:28 PM      Component Value Range Comment   Total CK 109  7 - 177 U/L    CK, MB 3.1  0.3 - 4.0 ng/mL    Troponin I <0.30  <0.30 ng/mL    Relative Index 2.8 (*) 0.0 - 2.5   LIPASE, BLOOD     Status: Normal   Collection Time   04/13/12  1:28 PM      Component Value Range Comment   Lipase 27  11 - 59 U/L   LACTIC ACID, PLASMA     Status: Normal   Collection Time   04/13/12  1:28 PM      Component Value Range Comment   Lactic Acid, Venous 0.9  0.5 - 2.2 mmol/L   GLUCOSE, CAPILLARY     Status: Abnormal   Collection Time   04/13/12  6:23 PM      Component Value Range Comment   Glucose-Capillary 101 (*) 70 - 99 mg/dL      Comment 1 Documented in Chart      Comment 2 Notify RN     HEMOGLOBIN A1C     Status: Abnormal   Collection Time   04/13/12  7:18 PM      Component Value Range Comment   Hemoglobin A1C 7.2 (*) <5.7 %    Mean Plasma Glucose 160 (*) <117 mg/dL   GLUCOSE, CAPILLARY     Status: Abnormal   Collection Time   04/13/12  9:48 PM      Component Value Range Comment   Glucose-Capillary 136 (*) 70 - 99 mg/dL   CARDIAC PANEL(CRET KIN+CKTOT+MB+TROPI)     Status: Normal   Collection Time   04/13/12 10:06 PM      Component Value Range Comment   Total CK 156  7 - 177 U/L    CK, MB 3.9  0.3 - 4.0 ng/mL    Troponin I <0.30  <0.30 ng/mL    Relative Index 2.5  0.0 - 2.5   TSH     Status: Normal   Collection Time   04/13/12 10:06 PM      Component Value Range Comment   TSH 1.313  0.350 - 4.500 uIU/mL   CARDIAC PANEL(CRET KIN+CKTOT+MB+TROPI)     Status: Normal   Collection Time   04/14/12  3:26 AM      Component Value Range Comment   Total CK 155  7 - 177 U/L    CK, MB 3.7  0.3 - 4.0 ng/mL    Troponin I <0.30  <0.30 ng/mL    Relative Index 2.4  0.0 - 2.5   BASIC METABOLIC PANEL     Status: Abnormal  Collection Time   04/14/12  3:27 AM      Component Value Range Comment   Sodium 143  135 - 145 mEq/L    Potassium 3.9  3.5 - 5.1 mEq/L    Chloride 109  96 - 112 mEq/L    CO2 22  19 - 32 mEq/L    Glucose, Bld 96  70 - 99 mg/dL    BUN 7  6 - 23 mg/dL    Creatinine, Ser 1.61  0.50 - 1.10 mg/dL    Calcium 9.6  8.4 - 09.6 mg/dL    GFR calc non Af Amer 90 (*) >90 mL/min    GFR calc Af Amer >90  >90 mL/min   PROTIME-INR     Status: Normal   Collection Time   04/14/12  3:27 AM      Component Value Range Comment   Prothrombin Time 13.9  11.6 - 15.2 seconds    INR 1.05  0.00 - 1.49   LIPID PANEL     Status: Abnormal   Collection Time   04/14/12  3:27 AM      Component Value Range Comment   Cholesterol 202 (*) 0 - 200 mg/dL    Triglycerides 045  <409 mg/dL    HDL 36 (*) >81 mg/dL    Total CHOL/HDL Ratio 5.6       VLDL 24  0 - 40 mg/dL    LDL Cholesterol 191 (*) 0 - 99 mg/dL   GLUCOSE, CAPILLARY     Status: Abnormal   Collection Time   04/14/12  6:12 AM      Component Value Range Comment   Glucose-Capillary 104 (*) 70 - 99 mg/dL   CARDIAC PANEL(CRET KIN+CKTOT+MB+TROPI)     Status: Normal   Collection Time   04/14/12  9:20 AM      Component Value Range Comment   Total CK 174  7 - 177 U/L    CK, MB 3.7  0.3 - 4.0 ng/mL    Troponin I <0.30  <0.30 ng/mL    Relative Index 2.1  0.0 - 2.5   GLUCOSE, CAPILLARY     Status: Normal   Collection Time   04/14/12 11:12 AM      Component Value Range Comment   Glucose-Capillary 96  70 - 99 mg/dL   GLUCOSE, CAPILLARY     Status: Normal   Collection Time   04/14/12  2:09 PM      Component Value Range Comment   Glucose-Capillary 91  70 - 99 mg/dL     Disposition:  Follow-up Information    Follow up with HAGER, BRYAN, PA. (office will call)    Contact information:   [address] [place] [address] [phone]       Follow up with Juline Patch, MD. Call in 2 weeks.   Contact information:   [address] [place] [address] [phone]          Discharge Medications:  Medication List  As of 04/14/2012  4:17 PM   TAKE these medications         acetaminophen 325 MG tablet   Commonly known as: TYLENOL   Take 2 tablets (650 mg total) by mouth every 4 (four) hours as needed.      aspirin EC 81 MG tablet   Take 81 mg by mouth daily.      atorvastatin 20 MG tablet   Commonly known as: LIPITOR  Take 1 tablet (20 mg total) by mouth daily at 6 PM.      cholecalciferol 1000 UNITS tablet   Commonly known as: VITAMIN D   Take 2,000 Units by mouth 2 (two) times daily.      Fluticasone-Salmeterol 250-50 MCG/DOSE Aepb   Commonly known as: ADVAIR   Inhale 1 puff into the lungs every 12 (twelve) hours.      fosinopril 10 MG tablet   Commonly known as: MONOPRIL   Take 10 mg by  mouth daily.      levothyroxine 50 MCG tablet   Commonly known as: SYNTHROID, LEVOTHROID   Take 50 mcg by mouth daily.      metFORMIN 500 MG tablet   Commonly known as: GLUCOPHAGE   Take 1 tablet (500 mg total) by mouth every evening.      montelukast 10 MG tablet   Commonly known as: SINGULAIR   Take 10 mg by mouth at bedtime.      omeprazole 20 MG capsule   Commonly known as: PRILOSEC   Take 1 capsule (20 mg total) by mouth daily.             Duration of Discharge Encounter: Greater than 30 minutes including physician time.  Jolene Provost PA-C 04/14/2012 4:17 PM

## 2012-04-14 NOTE — Progress Notes (Signed)
[facility] Progress Note  Subjective:  No further chest pain or arm discomfort  Objective:   Vital Signs in the last 24 hours: Temp:  [97.6 F (36.4 C)-98.4 F (36.9 C)] 97.6 F (36.4 C) (07/02 0500) Pulse Rate:  [67-99] 67  (07/02 0500) Resp:  [19-23] 20  (07/02 0500) BP: (123-156)/(49-82) 136/67 mmHg (07/02 0500) SpO2:  [92 %-97 %] 97 % (07/02 0500) Weight:  [72 kg (158 lb 11.7 oz)-72.077 kg (158 lb 14.4 oz)] 72 kg (158 lb 11.7 oz) (07/02 0500)  Intake/Output from previous day: 07/01 0701 - 07/02 0700 In: 807.5 [P.O.:240; I.V.:567.5] Out: 700 [Urine:700]  Scheduled:   . aspirin  324 mg Oral Pre-Cath  . aspirin EC  81 mg Oral Daily  . atorvastatin  10 mg Oral q1800  . cholecalciferol  2,000 Units Oral BID  . enoxaparin (LOVENOX) injection  1 mg/kg Subcutaneous Q12H  . Fluticasone-Salmeterol  1 puff Inhalation Q12H  . insulin aspart  0-9 Units Subcutaneous TID WC  . levothyroxine  50 mcg Oral QAC breakfast  . lisinopril  10 mg Oral Daily  . metoprolol tartrate  12.5 mg Oral BID  . montelukast  10 mg Oral QHS  . ondansetron (ZOFRAN) IV  4 mg Intravenous Once  . pantoprazole (PROTONIX) IV  40 mg Intravenous Once  . sodium chloride  1,000 mL Intravenous Once  . sodium chloride  3 mL Intravenous Q12H  . DISCONTD: iohexol  20 mL Oral Q1 Hr x 2    Physical Exam:   General appearance: alert, cooperative and no distress Neck: no adenopathy, no JVD and supple, symmetrical, trachea midline Lungs: diffuse rhochi Heart: S1, S2 normal and 1/6 SEM Abdomen: soft, non-tender; bowel sounds normal; no masses,  no organomegaly Extremities: no edema, redness or tenderness in the calves or thighs Pulses: 2+ and symmetric   Rate: 80  Rhythm: normal sinus rhythm  Lab Results:    Basename 04/14/12 0327 04/13/12 1328  NA 143 141  K 3.9 3.5  CL 109 105  CO2 22 22  GLUCOSE 96 113*  BUN 7 7  CREATININE 0.68 0.65   Cardiac Panel (last 3  results)  Basename 04/14/12 0326 04/13/12 2206 04/13/12 1328  CKTOTAL 155 156 109  CKMB 3.7 3.9 3.1  TROPONINI <0.30 <0.30 <0.30  RELINDX 2.4 2.5 2.8*      Basename 04/14/12 0326 04/13/12 2206  TROPONINI <0.30 <0.30   Hepatic Function Panel  Basename 04/13/12 1328  PROT 7.2  ALBUMIN 3.9  AST 19  ALT 20  ALKPHOS 72  BILITOT 0.4  BILIDIR --  IBILI --    Basename 04/14/12 0327  INR 1.05    Lipid Panel     Component Value Date/Time   CHOL 202* 04/14/2012 0327   TRIG 118 04/14/2012 0327   HDL 36* 04/14/2012 0327   CHOLHDL 5.6 04/14/2012 0327   VLDL 24 04/14/2012 0327   LDLCALC 142* 04/14/2012 0327     Imaging:  Ct Angio Chest W/cm &/or Wo Cm  04/13/2012  *RADIOLOGY REPORT*  Clinical Data:  Shortness of breath during cardiac stress test. Left arm pain and rib pain.  Abdominal tenderness.  Nausea and diarrhea.  Leukocytosis.  CT ANGIOGRAPHY CHEST CT ABDOMEN AND PELVIS WITH CONTRAST  Technique:  Multidetector CT imaging of the chest was performed using the standard protocol during bolus administration of intravenous contrast.  Multiplanar CT image reconstructions including MIPs were obtained to evaluate the vascular anatomy. Multidetector CT imaging  of the abdomen and pelvis was performed using the standard protocol during bolus administration of intravenous contrast.  Contrast: OMNIPAQUE IOHEXOL 350 MG/ML SOLN  Comparison:   None.  CTA CHEST  Findings:  No pulmonary embolus.  Mediastinal and hilar lymph nodes are not enlarged by CT size criteria.  No axillary adenopathy. Surgical clips are seen in the left axilla.  Atherosclerotic calcification of the arterial vasculature.  Heart is at the upper limits of normal in size.  No pericardial effusion.  Centrilobular and paraseptal emphysema.  Mild dependent atelectasis bilaterally.  Nodules measure up to 5 mm in the lingula (image 53). No pleural fluid.  Airway is unremarkable.   Review of the MIP images confirms the above findings.   IMPRESSION:  1.  No pulmonary embolus. 2.  Scattered small pulmonary nodules. Given risk factors for bronchogenic carcinoma, follow-up chest CT at 6 - 12 months is recommended.  This recommendation follows the consensus statement: Guidelines for Management of Small Pulmonary Nodules Detected on CT Scans: A Statement from the Fleischner Society as published in Radiology 2005; 237:395-400.  CT ABDOMEN AND PELVIS  Findings: Liver, gallbladder, and right adrenal gland are unremarkable.  An 11 mm nodule in the medial left adrenal gland is indeterminate.  Scattered low attenuation lesions in the kidneys measure up to 7 mm in the lower pole right kidney, too small to characterize.  Statistically, these likely represent cysts.  The spleen, pancreas, stomach and bowel are unremarkable.  Calcifications within the uterus likely represent fibroids. Ovaries are visualized.  Atherosclerotic calcification of the arterial vasculature without abdominal aortic aneurysm.  No pathologically enlarged lymph nodes.  No free fluid.  No worrisome lytic or sclerotic lesions.  Review of the MIP images confirms the above findings.  IMPRESSION:  1.  No acute findings in the abdomen or pelvis. 2.  Left adrenal nodule is indeterminate.  In the absence of known malignancy, an adenoma is considered most likely.  Original Report Authenticated By: Reyes Ivan, M.D.   Ct Abdomen Pelvis W Contrast  04/13/2012  *RADIOLOGY REPORT*  Clinical Data:  Shortness of breath during cardiac stress test. Left arm pain and rib pain.  Abdominal tenderness.  Nausea and diarrhea.  Leukocytosis.  CT ANGIOGRAPHY CHEST CT ABDOMEN AND PELVIS WITH CONTRAST  Technique:  Multidetector CT imaging of the chest was performed using the standard protocol during bolus administration of intravenous contrast.  Multiplanar CT image reconstructions including MIPs were obtained to evaluate the vascular anatomy. Multidetector CT imaging of the abdomen and pelvis was performed  using the standard protocol during bolus administration of intravenous contrast.  Contrast: OMNIPAQUE IOHEXOL 350 MG/ML SOLN  Comparison:   None.  CTA CHEST  Findings:  No pulmonary embolus.  Mediastinal and hilar lymph nodes are not enlarged by CT size criteria.  No axillary adenopathy. Surgical clips are seen in the left axilla.  Atherosclerotic calcification of the arterial vasculature.  Heart is at the upper limits of normal in size.  No pericardial effusion.  Centrilobular and paraseptal emphysema.  Mild dependent atelectasis bilaterally.  Nodules measure up to 5 mm in the lingula (image 53). No pleural fluid.  Airway is unremarkable.   Review of the MIP images confirms the above findings.  IMPRESSION:  1.  No pulmonary embolus. 2.  Scattered small pulmonary nodules. Given risk factors for bronchogenic carcinoma, follow-up chest CT at 6 - 12 months is recommended.  This recommendation follows the consensus statement: Guidelines for Management of Small Pulmonary  Nodules Detected on CT Scans: A Statement from the Fleischner Society as published in Radiology 2005; 237:395-400.  CT ABDOMEN AND PELVIS  Findings: Liver, gallbladder, and right adrenal gland are unremarkable.  An 11 mm nodule in the medial left adrenal gland is indeterminate.  Scattered low attenuation lesions in the kidneys measure up to 7 mm in the lower pole right kidney, too small to characterize.  Statistically, these likely represent cysts.  The spleen, pancreas, stomach and bowel are unremarkable.  Calcifications within the uterus likely represent fibroids. Ovaries are visualized.  Atherosclerotic calcification of the arterial vasculature without abdominal aortic aneurysm.  No pathologically enlarged lymph nodes.  No free fluid.  No worrisome lytic or sclerotic lesions.  Review of the MIP images confirms the above findings.  IMPRESSION:  1.  No acute findings in the abdomen or pelvis. 2.  Left adrenal nodule is indeterminate.  In the  absence of known malignancy, an adenoma is considered most likely.  Original Report Authenticated By: Reyes Ivan, M.D.      Assessment/Plan:   Principal Problem:  *Abdominal pain Active Problems:  Positive cardiac stress test, 04/10/12  Left arm pain, forearm  Tobacco abuse  HTN (hypertension)  Diabetes mellitus  History of breast cancer: 17 yrs ago.  Cardiac enzymes are negative. 50 pack year tobacco history. CT findings noted.  Discussed cath and possible PCI with pt and husband; will proceed this am.    Lennette Bihari, MD, [facility] 04/14/2012, 8:18 AM

## 2012-08-31 ENCOUNTER — Ambulatory Visit: Payer: BC Managed Care – PPO | Admitting: Family Medicine

## 2012-10-16 ENCOUNTER — Ambulatory Visit: Payer: BC Managed Care – PPO | Admitting: Family Medicine

## 2012-10-21 ENCOUNTER — Other Ambulatory Visit (HOSPITAL_COMMUNITY): Payer: Self-pay | Admitting: Internal Medicine

## 2012-10-21 DIAGNOSIS — R911 Solitary pulmonary nodule: Secondary | ICD-10-CM

## 2012-11-03 ENCOUNTER — Other Ambulatory Visit (HOSPITAL_COMMUNITY): Payer: Self-pay | Admitting: Internal Medicine

## 2012-11-03 ENCOUNTER — Ambulatory Visit (HOSPITAL_COMMUNITY)
Admission: RE | Admit: 2012-11-03 | Discharge: 2012-11-03 | Disposition: A | Discharge status: Home / Self Care | Payer: Medicare Other | Source: Ambulatory Visit | Attending: Internal Medicine | Admitting: Internal Medicine

## 2012-11-03 DIAGNOSIS — R911 Solitary pulmonary nodule: Secondary | ICD-10-CM

## 2012-11-03 DIAGNOSIS — R9389 Abnormal findings on diagnostic imaging of other specified body structures: Secondary | ICD-10-CM

## 2012-11-03 DIAGNOSIS — R918 Other nonspecific abnormal finding of lung field: Secondary | ICD-10-CM

## 2012-11-03 DIAGNOSIS — J438 Other emphysema: Secondary | ICD-10-CM | POA: Insufficient documentation

## 2012-11-06 ENCOUNTER — Encounter (HOSPITAL_COMMUNITY)
Admission: RE | Admit: 2012-11-06 | Discharge: 2012-11-06 | Disposition: A | Discharge status: Home / Self Care | Payer: Medicare Other | Source: Ambulatory Visit | Attending: Internal Medicine | Admitting: Internal Medicine

## 2012-11-06 ENCOUNTER — Encounter (HOSPITAL_COMMUNITY): Payer: Self-pay

## 2012-11-06 DIAGNOSIS — I708 Atherosclerosis of other arteries: Secondary | ICD-10-CM | POA: Insufficient documentation

## 2012-11-06 DIAGNOSIS — D3500 Benign neoplasm of unspecified adrenal gland: Secondary | ICD-10-CM | POA: Insufficient documentation

## 2012-11-06 DIAGNOSIS — J438 Other emphysema: Secondary | ICD-10-CM | POA: Insufficient documentation

## 2012-11-06 DIAGNOSIS — I700 Atherosclerosis of aorta: Secondary | ICD-10-CM | POA: Insufficient documentation

## 2012-11-06 DIAGNOSIS — E279 Disorder of adrenal gland, unspecified: Secondary | ICD-10-CM | POA: Insufficient documentation

## 2012-11-06 DIAGNOSIS — R9389 Abnormal findings on diagnostic imaging of other specified body structures: Secondary | ICD-10-CM

## 2012-11-06 DIAGNOSIS — R918 Other nonspecific abnormal finding of lung field: Secondary | ICD-10-CM

## 2012-11-06 DIAGNOSIS — D259 Leiomyoma of uterus, unspecified: Secondary | ICD-10-CM | POA: Insufficient documentation

## 2012-11-06 MED ORDER — FLUDEOXYGLUCOSE F - 18 (FDG) INJECTION
17.8000 | Freq: Once | INTRAVENOUS | Status: AC | PRN
  Administered 2012-11-06: 17.8 via INTRAVENOUS

## 2012-11-12 ENCOUNTER — Other Ambulatory Visit: Payer: Self-pay | Admitting: *Deleted

## 2012-11-12 ENCOUNTER — Institutional Professional Consult (permissible substitution) (INDEPENDENT_AMBULATORY_CARE_PROVIDER_SITE_OTHER): Payer: Medicare Other | Admitting: Thoracic Surgery (Cardiothoracic Vascular Surgery)

## 2012-11-12 ENCOUNTER — Encounter: Payer: Self-pay | Admitting: Thoracic Surgery (Cardiothoracic Vascular Surgery)

## 2012-11-12 ENCOUNTER — Other Ambulatory Visit: Payer: Self-pay

## 2012-11-12 ENCOUNTER — Encounter: Payer: Self-pay | Admitting: *Deleted

## 2012-11-12 VITALS — BP 159/83 | HR 96 | Resp 20 | Ht 66.0 in | Wt 139.0 lb

## 2012-11-12 DIAGNOSIS — R222 Localized swelling, mass and lump, trunk: Secondary | ICD-10-CM

## 2012-11-12 DIAGNOSIS — D381 Neoplasm of uncertain behavior of trachea, bronchus and lung: Secondary | ICD-10-CM

## 2012-11-12 DIAGNOSIS — Z853 Personal history of malignant neoplasm of breast: Secondary | ICD-10-CM

## 2012-11-12 DIAGNOSIS — R918 Other nonspecific abnormal finding of lung field: Secondary | ICD-10-CM

## 2012-11-12 DIAGNOSIS — J449 Chronic obstructive pulmonary disease, unspecified: Secondary | ICD-10-CM

## 2012-11-12 NOTE — Progress Notes (Signed)
PCP is MILLER, MARY SUZANNE, MD Referring Provider is Pang, Richard, MD  Chief Complaint  Patient presents with  . Lung Mass    Referral from Dr Pang for surgical eval on Lung mass, Chest CT 11/03/12, PET Scan 11/06/12    HPI: Sara Mcclure is a 66-year-old woman with a 50-pack-year history of smoking and a history of COPD who presents for evaluation of the left lung mass.  This [place] had an episode of chest pain and shortness of breath July of this past year. Sara Mcclure was evaluated and underwent cardiac catheterization. Sara Mcclure did not have any significant coronary disease. Sara Mcclure did have a chest x-ray around that time which showed a possible left lung nodule. A CT of the chest was done which confirmed a nodule in the left upper lobe lingular segment. Sara Mcclure recently had a 6 month followup CT which showed that the nodule has increased in size. This led to a PET/CT which showed the lesion to be hypermetabolic. There is also a small adrenal lesion, which is not hypermetabolic, it appears to be consistent with an adenoma.  Sara Mcclure does have a history of breast cancer on the left side 17 years ago. This was treated with lumpectomy and axillary dissection and radiation. Sara Mcclure's had no evidence recurrent since that time. Sara Mcclure does have a diagnosis of COPD and uses Spiriva and Advair daily. Sara Mcclure also has an albuterol rescue inhaler Sara Mcclure uses occasionally, on average less than once a week. Sara Mcclure has lost 27 pounds over the past 6 months. Sara Mcclure attributes this to a decreased appetite, but also diet and exercise. Sara Mcclure has minimal to no limitations on her physical activities due to her COPD.  Past Medical History  Diagnosis Date  . GERD (gastroesophageal reflux disease)   . Asthma   . Hypertension   . Diabetes mellitus   . Thyroid disease   . Anxiety   . Vertigo   . Breast cancer   . COPD (chronic obstructive pulmonary disease)     Past Surgical History  Procedure Date  . Cardiac catherization   . Breast  lumpectomy   . Tubal ligation     Family History  Problem Relation Age of Onset  . Cancer Mother   . COPD Mother   . Hypertension Mother   . COPD Father   . COPD Sister   . Cancer Sister   . COPD Brother   . Diabetes Brother   . Hyperlipidemia Brother   . Hypertension Brother   . Stroke Brother     Social History History  Substance Use Topics  . Smoking status: Current Every Day Smoker -- 1.0 packs/day for 50 years    Types: Cigarettes  . Smokeless tobacco: Not on file  . Alcohol Use: No    Current Outpatient Prescriptions  Medication Sig Dispense Refill  . albuterol (PROVENTIL HFA;VENTOLIN HFA) 108 (90 BASE) MCG/ACT inhaler Inhale 2 puffs into the lungs every 6 (six) hours as needed.      . aspirin EC 81 MG tablet Take 81 mg by mouth daily.      . cholecalciferol (VITAMIN D) 1000 UNITS tablet Take 2,000 Units by mouth 2 (two) times daily.      . Fluticasone-Salmeterol (ADVAIR) 250-50 MCG/DOSE AEPB Inhale 1 puff into the lungs every 12 (twelve) hours.      . fosinopril (MONOPRIL) 10 MG tablet Take 20 mg by mouth daily.       . levothyroxine (SYNTHROID, LEVOTHROID) 50 MCG tablet Take 50 mcg by   mouth daily.      . montelukast (SINGULAIR) 10 MG tablet Take 10 mg by mouth at bedtime.      . Probiotic Product (ALIGN PO) Take by mouth daily.        Allergies  Allergen Reactions  . Metformin And Related Nausea Only    Review of Systems  Constitutional: Positive for appetite change. Negative for fever and fatigue.  HENT: Positive for hearing loss.   Respiratory: Positive for shortness of breath and wheezing.   Cardiovascular: Positive for chest pain (in July 2013) and palpitations.  Gastrointestinal:       GER, hiatal hernia  Hematological: Does not bruise/bleed easily.  Psychiatric/Behavioral:       Anxiety  All other systems reviewed and are negative.    BP 159/83  Pulse 96  Resp 20  Ht 5' 6" (1.676 m)  Wt 139 lb (63.05 kg)  BMI 22.44 kg/m2  SpO2  97% Physical Exam  Vitals reviewed. Constitutional: Sara Mcclure is oriented to person, place, and time. Sara Mcclure appears well-developed and well-nourished. No distress.  HENT:  Head: Normocephalic and atraumatic.       Very hard of hearing  Eyes: EOM are normal. Pupils are equal, round, and reactive to light.  Neck: Normal range of motion. Neck supple. No thyromegaly present.       Well healed surgical scars  Pulmonary/Chest: Effort normal.       Mild end inspiratory stridor  Abdominal: Soft. There is no tenderness.  Musculoskeletal: Sara Mcclure exhibits no edema.  Lymphadenopathy:    Sara Mcclure has no cervical adenopathy.  Neurological: Sara Mcclure is alert and oriented to person, place, and time. No cranial nerve deficit.  Skin: Skin is warm and dry.  Psychiatric: Sara Mcclure has a normal mood and affect.     Diagnostic Tests: Chest CT 11/03/12 CT CHEST WITHOUT CONTRAST  Technique: Multidetector CT imaging of the chest was performed  following the standard protocol without IV contrast.  Comparison: 04/13/2012  Findings: Mild pulmonary emphysema is again demonstrated. A  peripheral pulmonary nodule with ill-defined margins is seen in the  lingula which now measures 13 mm on image 32, compared with 5 mm  previously. A second pulmonary nodule in the inferior lingula on  image 35 measures 4 mm and is not simply changed. No other new or  enlarging pulmonary nodules or masses are identified. There is no  evidence of acute infiltrate.  Shotty less than 1 cm mediastinal lymph nodes remain stable. No  pathologically enlarged nodes are seen within the thorax. Patient  has undergone previous left lumpectomy and axillary lymph node  dissection. No evidence of axillary lymphadenopathy or chest wall  mass. No evidence of pleural or pericardial effusion.  A small left adrenal nodule measuring 1.1 cm is stable and most  likely represents a small adrenal adenoma. No suspicious bone  lesions are identified.  IMPRESSION:  1.  Increased size of 13 mm peripheral pulmonary nodule in the  lingula. Bronchogenic carcinoma cannot be excluded. Consider PET  scan and/or percutaneous needle biopsy for further evaluation.  Consultation with the [facility]  ([phone]) should be considered.  2. 4 mm satellite nodule within the lingula remains stable.  3. Pulmonary emphysema.  4. No evidence of lymphadenopathy or pleural effusion.   PET/CT 11/06/12 NUCLEAR MEDICINE PET SKULL BASE TO THIGH  Fasting Blood Glucose: 84  Technique: 17.8 mCi F-18 FDG was injected intravenously. CT data  was obtained and used for attenuation correction and anatomic    localization only. (This was not acquired as a diagnostic CT  examination.) Additional exam technical data entered on  technologist worksheet.  Comparison: 11/03/2012 chest CT  Findings:  Neck: No hypermetabolic lymph nodes in the neck.  Chest: The left upper lobe pulmonary nodule is hypermetabolic,  with maximum standard uptake value of 3.7. There is a vertical  misregistration, with the activity higher in position in the nodule  itself. No measurable hypermetabolic activity is observed in the  adjacent 4 mm satellite nodule. No significant hypermetabolic  activity associated with the postoperative findings in the left  breast. No significant abnormal nodal activity in the chest.  Emphysema noted.  Abdomen/Pelvis: A small low density nodule of the medial limb of  the left adrenal gland does not appear to be hypermetabolic and  favors adenoma, short axis diameter 9 mm. Aortoiliac  atherosclerotic vascular disease noted. Calcified uterine fibroids  noted.  Skeleton: No focal hypermetabolic activity to suggest skeletal  metastasis.  IMPRESSION:  1.  1. The left upper lobe pulmonary nodule of concern is  hypermetabolic, favoring malignancy. Tissue diagnosis or resection  recommended. There is a nearby 4 mm nodule which should probably   also be resected of wedge resection is performed.  2. Small left adrenal adenoma.  3. Atherosclerosis.  Cardiac catheterization 7/13 HEMODYNAMICS:  AO SYSTOLIC/AO DIASTOLIC: 128/60  LV SYSTOLIC/LV DIASTOLIC: 128/13  ANGIOGRAPHIC RESULTS:  1. Left main: Nl  2. LAD: mild luminal irregularity of 10 to <20% proximally  3. Left circumflex: Nl  4. Right coronary artery: Nl dominant vessel  5 . Left ventriculography; RAO left ventriculogram was performed using  25 mL of Visipaque dye at 12 mL/second. The overall LVEF estimated  60 - 65 % Without wall motion abnormalities.  6. Distal AortographyL: 10 - 20% right renal artery narrowing; mild luminal irregularity distal aorta.  IMPRESSION: Normal LV function  Very mild nonobstructive CAD.  REC: Medical therapy with smoking cessation, lifestyle modication.  Impression: 66-year-old woman with a 50-pack-year history of smoking who has an enlarging nodule in the lingula. This is hypermetabolic by PET. This has to be considered a new primary bronchogenic carcinoma until it can be proven otherwise. I had a long discussion with the patient and her husband regarding the nodule. We discussed the differential diagnosis as well as potential diagnostic and therapeutic interventions. My recommendation to her is to proceed with left VATS, wedge resection for frozen section analysis, and then proceed with segmentectomy or lobectomy if this is confirmed to be cancerous. There is also a very small 4 mm nodule in the vicinity of the larger mass, that should be encompassed with a resection.  I discussed with the patient and her husband the general nature of the operation including the need for general anesthesia, incisions to be used, expected hospital stay, and overall recovery. We did discuss potential for cure if this does turn out to be cancerous, although they do understand there is no guarantee that would be curative. I discussed with them the indications, risks,  benefits, and alternatives. They understand that the risks include, but are not limited to, death, MI, DVT, PE, bleeding, possible need for transfusion, infection, prolonged air leak, cardiac arrhythmias, as well as other potential unforeseeable, occasions.  Sara Mcclure understands and accepts these risks and wishes to proceed.  Plan: Pulmonary function testing with room air blood gas  Left VATS, wedge resection, possible segmentectomy or lobectomy on Thursday, 11/19/2012 

## 2012-11-12 NOTE — Progress Notes (Signed)
Spoke with pt and husband at [facility] 11/12/12.  Information given and explained on surgical procedure and smoking cessation.

## 2012-11-12 NOTE — Assessment & Plan Note (Signed)
Left lumpectomy and axillary dissection, XRT

## 2012-11-13 ENCOUNTER — Encounter (HOSPITAL_COMMUNITY): Payer: Self-pay | Admitting: Pharmacist

## 2012-11-17 ENCOUNTER — Ambulatory Visit (HOSPITAL_COMMUNITY)
Admission: RE | Admit: 2012-11-17 | Discharge: 2012-11-17 | Disposition: A | Discharge status: Home / Self Care | Payer: Medicare Other | Source: Ambulatory Visit | Attending: Thoracic Surgery (Cardiothoracic Vascular Surgery) | Admitting: Thoracic Surgery (Cardiothoracic Vascular Surgery)

## 2012-11-17 ENCOUNTER — Encounter (HOSPITAL_COMMUNITY)
Admission: RE | Admit: 2012-11-17 | Discharge: 2012-11-17 | Disposition: A | Discharge status: Home / Self Care | Payer: Medicare Other | Source: Ambulatory Visit | Attending: Thoracic Surgery (Cardiothoracic Vascular Surgery) | Admitting: Thoracic Surgery (Cardiothoracic Vascular Surgery)

## 2012-11-17 ENCOUNTER — Encounter (HOSPITAL_COMMUNITY): Payer: Self-pay

## 2012-11-17 VITALS — BP 103/68 | HR 92 | Temp 98.2°F | Resp 20 | Ht 66.0 in | Wt 137.5 lb

## 2012-11-17 DIAGNOSIS — D381 Neoplasm of uncertain behavior of trachea, bronchus and lung: Secondary | ICD-10-CM

## 2012-11-17 DIAGNOSIS — R222 Localized swelling, mass and lump, trunk: Secondary | ICD-10-CM | POA: Insufficient documentation

## 2012-11-17 DIAGNOSIS — R918 Other nonspecific abnormal finding of lung field: Secondary | ICD-10-CM

## 2012-11-17 HISTORY — DX: Other specified postprocedural states: R11.2

## 2012-11-17 HISTORY — DX: Other specified postprocedural states: Z98.890

## 2012-11-17 LAB — COMPREHENSIVE METABOLIC PANEL
Alkaline Phosphatase: 77 U/L (ref 39–117)
BUN: 12 mg/dL (ref 6–23)
Chloride: 105 mEq/L (ref 96–112)
GFR calc Af Amer: >90 mL/min (ref >90)
GFR calc non Af Amer: >90 mL/min (ref >90)
Glucose, Bld: 144 mg/dL — ABNORMAL HIGH (ref 70–99)
Potassium: 4 mEq/L (ref 3.5–5.1)
Total Bilirubin: 0.4 mg/dL (ref 0.3–1.2)

## 2012-11-17 LAB — CBC
HCT: 42.5 % (ref 36.0–46.0)
Hemoglobin: 14.9 g/dL (ref 12.0–15.0)
WBC: 11 10*3/uL — ABNORMAL HIGH (ref 4.0–10.5)

## 2012-11-17 LAB — PULMONARY FUNCTION TEST

## 2012-11-17 LAB — BLOOD GAS, ARTERIAL
Acid-base deficit: 1.3 mmol/L (ref 0.0–2.0)
Bicarbonate: 22.2 mEq/L (ref 20.0–24.0)
Patient temperature: 98.6
TCO2: 23.3 mmol/L (ref 0–100)
pCO2 arterial: 33 mmHg — ABNORMAL LOW (ref 35.0–45.0)
pH, Arterial: 7.444 (ref 7.350–7.450)

## 2012-11-17 LAB — URINALYSIS, ROUTINE W REFLEX MICROSCOPIC
Bilirubin Urine: NEGATIVE
Hgb urine dipstick: NEGATIVE
Nitrite: NEGATIVE
Specific Gravity, Urine: 1.008 (ref 1.005–1.030)
pH: 6 (ref 5.0–8.0)

## 2012-11-17 LAB — PROTIME-INR: Prothrombin Time: 12.4 seconds (ref 11.6–15.2)

## 2012-11-17 LAB — TYPE AND SCREEN

## 2012-11-17 LAB — SURGICAL PCR SCREEN: MRSA, PCR: NEGATIVE

## 2012-11-17 NOTE — Pre-Procedure Instructions (Signed)
Sara Mcclure  11/17/2012   Your procedure is scheduled on:  Nov 19, 2012  Report to [facility] at 7:30 AM.  Call this number if you have problems the morning of surgery: [phone]   Remember:   Do not eat food or drink liquids after midnight.   Take these medicines the morning of surgery with A SIP OF WATER: nebulizer as needed, inhaler as needed, levothyroxine, zegrid  ASPIRIN AS DIRECTED    Do not wear jewelry, make-up or nail polish.  Do not wear lotions, powders, or perfumes. You may wear deodorant.  Do not shave 48 hours prior to surgery. Men may shave face and neck.  Do not bring valuables to the hospital.  Contacts, dentures or bridgework may not be worn into surgery.  Leave suitcase in the car. After surgery it may be brought to your room.  For patients admitted to the hospital, checkout time is 11:00 AM the day of  discharge.   Patients discharged the day of surgery will not be allowed to drive  home.  Name and phone number of your driver:   Special Instructions: Shower using CHG 2 nights before surgery and the night before surgery.  If you shower the day of surgery use CHG.  Use special wash - you have one bottle of CHG for all showers.  You should use approximately 1/3 of the bottle for each shower.   Please read over the following fact sheets that you were given: Pain Booklet, Coughing and Deep Breathing, Blood Transfusion Information and Surgical Site Infection Prevention

## 2012-11-18 MED ORDER — DEXTROSE 5 % IV SOLN
1.5000 g | INTRAVENOUS | Status: AC
  Administered 2012-11-19: 1.5 g via INTRAVENOUS
  Filled 2012-11-18: qty 1.5

## 2012-11-19 ENCOUNTER — Encounter (HOSPITAL_COMMUNITY): Payer: Self-pay | Admitting: Anesthesiology

## 2012-11-19 ENCOUNTER — Encounter (HOSPITAL_COMMUNITY): Payer: Self-pay | Admitting: *Deleted

## 2012-11-19 ENCOUNTER — Inpatient Hospital Stay (HOSPITAL_COMMUNITY)
Admission: RE | Admit: 2012-11-19 | Discharge: 2012-11-25 | DRG: 164 | Disposition: A | Discharge status: Home / Self Care | Payer: Medicare Other | Source: Ambulatory Visit | Attending: Thoracic Surgery (Cardiothoracic Vascular Surgery) | Admitting: Thoracic Surgery (Cardiothoracic Vascular Surgery)

## 2012-11-19 ENCOUNTER — Inpatient Hospital Stay (HOSPITAL_COMMUNITY): Payer: Medicare Other | Admitting: Anesthesiology

## 2012-11-19 ENCOUNTER — Encounter (HOSPITAL_COMMUNITY)
Admission: RE | Disposition: A | Discharge status: Home / Self Care | Payer: Self-pay | Source: Ambulatory Visit | Attending: Thoracic Surgery (Cardiothoracic Vascular Surgery)

## 2012-11-19 ENCOUNTER — Inpatient Hospital Stay (HOSPITAL_COMMUNITY): Payer: Medicare Other

## 2012-11-19 DIAGNOSIS — Y836 Removal of other organ (partial) (total) as the cause of abnormal reaction of the patient, or of later complication, without mention of misadventure at the time of the procedure: Secondary | ICD-10-CM | POA: Diagnosis not present

## 2012-11-19 DIAGNOSIS — Y9210 Unspecified residential institution as the place of occurrence of the external cause: Secondary | ICD-10-CM | POA: Diagnosis not present

## 2012-11-19 DIAGNOSIS — J95812 Postprocedural air leak: Secondary | ICD-10-CM | POA: Diagnosis not present

## 2012-11-19 DIAGNOSIS — J449 Chronic obstructive pulmonary disease, unspecified: Secondary | ICD-10-CM | POA: Diagnosis present

## 2012-11-19 DIAGNOSIS — K219 Gastro-esophageal reflux disease without esophagitis: Secondary | ICD-10-CM | POA: Diagnosis present

## 2012-11-19 DIAGNOSIS — J4489 Other specified chronic obstructive pulmonary disease: Secondary | ICD-10-CM | POA: Diagnosis present

## 2012-11-19 DIAGNOSIS — F17200 Nicotine dependence, unspecified, uncomplicated: Secondary | ICD-10-CM | POA: Diagnosis present

## 2012-11-19 DIAGNOSIS — D381 Neoplasm of uncertain behavior of trachea, bronchus and lung: Secondary | ICD-10-CM

## 2012-11-19 DIAGNOSIS — C3410 Malignant neoplasm of upper lobe, unspecified bronchus or lung: Principal | ICD-10-CM | POA: Diagnosis present

## 2012-11-19 DIAGNOSIS — I10 Essential (primary) hypertension: Secondary | ICD-10-CM | POA: Diagnosis present

## 2012-11-19 DIAGNOSIS — Z853 Personal history of malignant neoplasm of breast: Secondary | ICD-10-CM

## 2012-11-19 DIAGNOSIS — E119 Type 2 diabetes mellitus without complications: Secondary | ICD-10-CM | POA: Diagnosis present

## 2012-11-19 DIAGNOSIS — R918 Other nonspecific abnormal finding of lung field: Secondary | ICD-10-CM

## 2012-11-19 DIAGNOSIS — F411 Generalized anxiety disorder: Secondary | ICD-10-CM | POA: Diagnosis present

## 2012-11-19 HISTORY — PX: LYMPH NODE DISSECTION: SHX5087

## 2012-11-19 HISTORY — PX: VIDEO ASSISTED THORACOSCOPY (VATS)/WEDGE RESECTION: SHX6174

## 2012-11-19 HISTORY — PX: LOBECTOMY: SHX5089

## 2012-11-19 SURGERY — VIDEO ASSISTED THORACOSCOPY (VATS)/WEDGE RESECTION
Anesthesia: General | Site: Chest | Laterality: Left | Wound class: Clean Contaminated

## 2012-11-19 MED ORDER — FENTANYL 10 MCG/ML IV SOLN
INTRAVENOUS | Status: DC
  Administered 2012-11-19: 15:00:00 via INTRAVENOUS
  Filled 2012-11-19: qty 50

## 2012-11-19 MED ORDER — OXYCODONE HCL 5 MG PO TABS: 5.0000 mg | ORAL_TABLET | Freq: Once | ORAL | Status: DC | PRN

## 2012-11-19 MED ORDER — DIPHENHYDRAMINE HCL 50 MG/ML IJ SOLN: 12.5000 mg | Freq: Four times a day (QID) | INTRAMUSCULAR | Status: DC | PRN

## 2012-11-19 MED ORDER — BUPIVACAINE 0.5 % ON-Q PUMP SINGLE CATH 400 ML
400.0000 mL | INJECTION | Freq: Once | Status: DC
  Filled 2012-11-19: qty 400

## 2012-11-19 MED ORDER — BISACODYL 5 MG PO TBEC
10.0000 mg | DELAYED_RELEASE_TABLET | Freq: Every day | ORAL | Status: DC
  Administered 2012-11-20 – 2012-11-24 (×4): 10 mg via ORAL
  Filled 2012-11-19 (×4): qty 2

## 2012-11-19 MED ORDER — PROPOFOL 10 MG/ML IV BOLUS
INTRAVENOUS | Status: DC | PRN
  Administered 2012-11-19: 120 mg via INTRAVENOUS
  Administered 2012-11-19: 70 mg via INTRAVENOUS

## 2012-11-19 MED ORDER — VITAMIN D3 25 MCG (1000 UNIT) PO TABS
2000.0000 [IU] | ORAL_TABLET | Freq: Two times a day (BID) | ORAL | Status: DC
  Administered 2012-11-20 – 2012-11-24 (×6): 2000 [IU] via ORAL
  Filled 2012-11-19 (×13): qty 2

## 2012-11-19 MED ORDER — FENTANYL CITRATE 0.05 MG/ML IJ SOLN
INTRAMUSCULAR | Status: AC
  Filled 2012-11-19: qty 2

## 2012-11-19 MED ORDER — VITAMIN D 50 MCG (2000 UT) PO TABS: 2000.0000 [IU] | ORAL_TABLET | Freq: Two times a day (BID) | ORAL | Status: DC

## 2012-11-19 MED ORDER — OXYCODONE HCL 5 MG/5ML PO SOLN: 5.0000 mg | Freq: Once | ORAL | Status: DC | PRN

## 2012-11-19 MED ORDER — FENTANYL CITRATE 0.05 MG/ML IJ SOLN
INTRAMUSCULAR | Status: DC | PRN
  Administered 2012-11-19 (×6): 50 ug via INTRAVENOUS
  Administered 2012-11-19: 100 ug via INTRAVENOUS

## 2012-11-19 MED ORDER — DIPHENHYDRAMINE HCL 12.5 MG/5ML PO ELIX
12.5000 mg | ORAL_SOLUTION | Freq: Four times a day (QID) | ORAL | Status: DC | PRN
  Filled 2012-11-19: qty 5

## 2012-11-19 MED ORDER — KCL IN DEXTROSE-NACL 20-5-0.45 MEQ/L-%-% IV SOLN
INTRAVENOUS | Status: DC
  Administered 2012-11-20 – 2012-11-21 (×4): via INTRAVENOUS
  Filled 2012-11-19 (×8): qty 1000

## 2012-11-19 MED ORDER — HYDROMORPHONE HCL PF 1 MG/ML IJ SOLN
0.2500 mg | INTRAMUSCULAR | Status: DC | PRN
  Administered 2012-11-19 (×4): 0.5 mg via INTRAVENOUS

## 2012-11-19 MED ORDER — PHENYLEPHRINE HCL 10 MG/ML IJ SOLN
10.0000 mg | INTRAVENOUS | Status: DC | PRN
  Administered 2012-11-19: 25 ug/min via INTRAVENOUS

## 2012-11-19 MED ORDER — LISINOPRIL 20 MG PO TABS
20.0000 mg | ORAL_TABLET | Freq: Every day | ORAL | Status: DC
  Administered 2012-11-20 – 2012-11-25 (×6): 20 mg via ORAL
  Filled 2012-11-19 (×7): qty 1

## 2012-11-19 MED ORDER — ROCURONIUM BROMIDE 100 MG/10ML IV SOLN
INTRAVENOUS | Status: DC | PRN
  Administered 2012-11-19: 50 mg via INTRAVENOUS

## 2012-11-19 MED ORDER — NEOSTIGMINE METHYLSULFATE 1 MG/ML IJ SOLN
INTRAMUSCULAR | Status: DC | PRN
  Administered 2012-11-19: 4 mg via INTRAVENOUS

## 2012-11-19 MED ORDER — NALOXONE HCL 0.4 MG/ML IJ SOLN: 0.4000 mg | INTRAMUSCULAR | Status: DC | PRN

## 2012-11-19 MED ORDER — MIDAZOLAM HCL 2 MG/2ML IJ SOLN
INTRAMUSCULAR | Status: AC
  Filled 2012-11-19: qty 2

## 2012-11-19 MED ORDER — HYDROMORPHONE HCL PF 1 MG/ML IJ SOLN
INTRAMUSCULAR | Status: AC
  Filled 2012-11-19: qty 1

## 2012-11-19 MED ORDER — BUPIVACAINE HCL (PF) 0.25 % IJ SOLN
INTRAMUSCULAR | Status: AC
  Filled 2012-11-19: qty 30

## 2012-11-19 MED ORDER — ALBUTEROL SULFATE HFA 108 (90 BASE) MCG/ACT IN AERS
4.0000 | INHALATION_SPRAY | Freq: Four times a day (QID) | RESPIRATORY_TRACT | Status: DC
  Administered 2012-11-19 – 2012-11-20 (×2): 4 via RESPIRATORY_TRACT
  Filled 2012-11-19: qty 6.7

## 2012-11-19 MED ORDER — ACETAMINOPHEN 10 MG/ML IV SOLN
INTRAVENOUS | Status: AC
  Filled 2012-11-19: qty 100

## 2012-11-19 MED ORDER — MONTELUKAST SODIUM 10 MG PO TABS
10.0000 mg | ORAL_TABLET | Freq: Every day | ORAL | Status: DC
  Administered 2012-11-20 – 2012-11-24 (×5): 10 mg via ORAL
  Filled 2012-11-19 (×7): qty 1

## 2012-11-19 MED ORDER — ARTIFICIAL TEARS OP OINT
TOPICAL_OINTMENT | OPHTHALMIC | Status: DC | PRN
  Administered 2012-11-19: 1 via OPHTHALMIC

## 2012-11-19 MED ORDER — 0.9 % SODIUM CHLORIDE (POUR BTL) OPTIME
TOPICAL | Status: DC | PRN
  Administered 2012-11-19: 2000 mL

## 2012-11-19 MED ORDER — POTASSIUM CHLORIDE 10 MEQ/50ML IV SOLN: 10.0000 meq | Freq: Every day | INTRAVENOUS | Status: DC | PRN

## 2012-11-19 MED ORDER — BUPIVACAINE ON-Q PAIN PUMP (FOR ORDER SET NO CHG)
INJECTION | Status: AC
  Filled 2012-11-19: qty 1

## 2012-11-19 MED ORDER — FENTANYL CITRATE 0.05 MG/ML IJ SOLN
50.0000 ug | INTRAMUSCULAR | Status: DC | PRN
  Administered 2012-11-19: 100 ug via INTRAVENOUS

## 2012-11-19 MED ORDER — PHENYLEPHRINE HCL 10 MG/ML IJ SOLN
INTRAMUSCULAR | Status: DC | PRN
  Administered 2012-11-19 (×5): 40 ug via INTRAVENOUS
  Administered 2012-11-19: 80 ug via INTRAVENOUS
  Administered 2012-11-19: 40 ug via INTRAVENOUS

## 2012-11-19 MED ORDER — MEPERIDINE HCL 25 MG/ML IJ SOLN: 6.2500 mg | INTRAMUSCULAR | Status: DC | PRN

## 2012-11-19 MED ORDER — ASPIRIN EC 81 MG PO TBEC
81.0000 mg | DELAYED_RELEASE_TABLET | Freq: Every day | ORAL | Status: DC
  Administered 2012-11-20 – 2012-11-25 (×6): 81 mg via ORAL
  Filled 2012-11-19 (×7): qty 1

## 2012-11-19 MED ORDER — TRAMADOL HCL 50 MG PO TABS
50.0000 mg | ORAL_TABLET | Freq: Four times a day (QID) | ORAL | Status: DC | PRN
  Administered 2012-11-20 – 2012-11-22 (×3): 100 mg via ORAL
  Filled 2012-11-19 (×3): qty 2

## 2012-11-19 MED ORDER — LEVOTHYROXINE SODIUM 50 MCG PO TABS
50.0000 ug | ORAL_TABLET | Freq: Every day | ORAL | Status: DC
  Administered 2012-11-20 – 2012-11-25 (×6): 50 ug via ORAL
  Filled 2012-11-19 (×7): qty 1

## 2012-11-19 MED ORDER — ONDANSETRON HCL 4 MG/2ML IJ SOLN
4.0000 mg | Freq: Four times a day (QID) | INTRAMUSCULAR | Status: DC | PRN
  Administered 2012-11-19 – 2012-11-20 (×2): 4 mg via INTRAVENOUS
  Filled 2012-11-19 (×2): qty 2

## 2012-11-19 MED ORDER — BUPIVACAINE HCL (PF) 0.25 % IJ SOLN
INTRAMUSCULAR | Status: DC | PRN
  Administered 2012-11-19: 2 mL

## 2012-11-19 MED ORDER — PROMETHAZINE HCL 25 MG/ML IJ SOLN: 6.2500 mg | INTRAMUSCULAR | Status: DC | PRN

## 2012-11-19 MED ORDER — GLYCOPYRROLATE 0.2 MG/ML IJ SOLN
INTRAMUSCULAR | Status: DC | PRN
  Administered 2012-11-19: 0.6 mg via INTRAVENOUS

## 2012-11-19 MED ORDER — ONDANSETRON HCL 4 MG/2ML IJ SOLN
INTRAMUSCULAR | Status: DC | PRN
  Administered 2012-11-19: 4 mg via INTRAVENOUS

## 2012-11-19 MED ORDER — MIDAZOLAM HCL 2 MG/2ML IJ SOLN
1.0000 mg | INTRAMUSCULAR | Status: DC | PRN
  Administered 2012-11-19: 2 mg via INTRAVENOUS

## 2012-11-19 MED ORDER — SODIUM CHLORIDE 0.9 % IJ SOLN: 9.0000 mL | INTRAMUSCULAR | Status: DC | PRN

## 2012-11-19 MED ORDER — SUCRALFATE 1 GM/10ML PO SUSP
1.0000 g | Freq: Three times a day (TID) | ORAL | Status: DC
  Administered 2012-11-20 – 2012-11-24 (×11): 1 g via ORAL
  Filled 2012-11-19 (×26): qty 10

## 2012-11-19 MED ORDER — OXYCODONE HCL 5 MG PO TABS: 5.0000 mg | ORAL_TABLET | ORAL | Status: AC | PRN

## 2012-11-19 MED ORDER — OXYCODONE-ACETAMINOPHEN 5-325 MG PO TABS
1.0000 | ORAL_TABLET | ORAL | Status: DC | PRN
  Administered 2012-11-21 – 2012-11-25 (×2): 2 via ORAL
  Filled 2012-11-19 (×2): qty 2

## 2012-11-19 MED ORDER — SENNOSIDES-DOCUSATE SODIUM 8.6-50 MG PO TABS
1.0000 | ORAL_TABLET | Freq: Every evening | ORAL | Status: DC | PRN
  Filled 2012-11-19: qty 1

## 2012-11-19 MED ORDER — MOMETASONE FURO-FORMOTEROL FUM 100-5 MCG/ACT IN AERO
2.0000 | INHALATION_SPRAY | Freq: Two times a day (BID) | RESPIRATORY_TRACT | Status: DC
  Administered 2012-11-19 – 2012-11-25 (×12): 2 via RESPIRATORY_TRACT
  Filled 2012-11-19: qty 8.8

## 2012-11-19 MED ORDER — LACTATED RINGERS IV SOLN
INTRAVENOUS | Status: DC | PRN
  Administered 2012-11-19 (×3): via INTRAVENOUS

## 2012-11-19 MED ORDER — ONDANSETRON HCL 4 MG/2ML IJ SOLN: 4.0000 mg | Freq: Four times a day (QID) | INTRAMUSCULAR | Status: DC | PRN

## 2012-11-19 MED ORDER — ACETAMINOPHEN 10 MG/ML IV SOLN
1000.0000 mg | Freq: Four times a day (QID) | INTRAVENOUS | Status: AC
  Administered 2012-11-19 – 2012-11-20 (×4): 1000 mg via INTRAVENOUS
  Filled 2012-11-19 (×4): qty 100

## 2012-11-19 MED ORDER — VECURONIUM BROMIDE 10 MG IV SOLR
INTRAVENOUS | Status: DC | PRN
  Administered 2012-11-19 (×3): 1 mg via INTRAVENOUS

## 2012-11-19 MED ORDER — LACTATED RINGERS IV SOLN
INTRAVENOUS | Status: DC
  Administered 2012-11-19: 10:00:00 via INTRAVENOUS

## 2012-11-19 MED ORDER — ACETAMINOPHEN 10 MG/ML IV SOLN
1000.0000 mg | Freq: Once | INTRAVENOUS | Status: AC | PRN
  Administered 2012-11-19: 1000 mg via INTRAVENOUS

## 2012-11-19 MED ORDER — DEXTROSE 5 % IV SOLN
1.5000 g | Freq: Two times a day (BID) | INTRAVENOUS | Status: AC
  Administered 2012-11-19 – 2012-11-20 (×2): 1.5 g via INTRAVENOUS
  Filled 2012-11-19 (×2): qty 1.5

## 2012-11-19 MED ORDER — BUPIVACAINE 0.5 % ON-Q PUMP SINGLE CATH 400 ML
INJECTION | Status: DC | PRN
  Administered 2012-11-19: 400 mL

## 2012-11-19 MED ORDER — HEMOSTATIC AGENTS (NO CHARGE) OPTIME
TOPICAL | Status: DC | PRN
  Administered 2012-11-19: 1 via TOPICAL

## 2012-11-19 MED ORDER — KCL IN DEXTROSE-NACL 20-5-0.45 MEQ/L-%-% IV SOLN
INTRAVENOUS | Status: AC
  Filled 2012-11-19: qty 1000

## 2012-11-19 SURGICAL SUPPLY — 85 items
APL SKNCLS STERI-STRIP NONHPOA (GAUZE/BANDAGES/DRESSINGS) ×1
BAG SPEC RTRVL LRG 6X4 10 (ENDOMECHANICALS) ×1
BENZOIN TINCTURE PRP APPL 2/3 (GAUZE/BANDAGES/DRESSINGS) ×2 IMPLANT
CANISTER SUCTION 2500CC (MISCELLANEOUS) ×3 IMPLANT
CATH KIT ON Q 5IN SLV (PAIN MANAGEMENT) ×1 IMPLANT
CATH THORACIC 28FR (CATHETERS) ×1 IMPLANT
CATH THORACIC 36FR (CATHETERS) IMPLANT
CATH THORACIC 36FR RT ANG (CATHETERS) IMPLANT
CLIP TI MEDIUM 6 (CLIP) ×3 IMPLANT
CLOTH BEACON ORANGE TIMEOUT ST (SAFETY) ×2 IMPLANT
CONN ST 1/4X3/8  BEN (MISCELLANEOUS) ×2
CONN ST 1/4X3/8 BEN (MISCELLANEOUS) IMPLANT
CONN Y 3/8X3/8X3/8  BEN (MISCELLANEOUS) ×1
CONN Y 3/8X3/8X3/8 BEN (MISCELLANEOUS) IMPLANT
CONT SPEC 4OZ CLIKSEAL STRL BL (MISCELLANEOUS) ×11 IMPLANT
DRAIN CHANNEL 28F RND 3/8 FF (WOUND CARE) IMPLANT
DRAIN CHANNEL 32F RND 10.7 FF (WOUND CARE) IMPLANT
DRAPE LAPAROSCOPIC ABDOMINAL (DRAPES) ×2 IMPLANT
DRAPE WARM FLUID 44X44 (DRAPE) ×3 IMPLANT
ELECT REM PT RETURN 9FT ADLT (ELECTROSURGICAL) ×2
ELECTRODE REM PT RTRN 9FT ADLT (ELECTROSURGICAL) ×1 IMPLANT
GLOVE BIOGEL M 6.5 STRL (GLOVE) ×2 IMPLANT
GLOVE BIOGEL PI IND STRL 6 (GLOVE) IMPLANT
GLOVE BIOGEL PI IND STRL 6.5 (GLOVE) IMPLANT
GLOVE BIOGEL PI INDICATOR 6 (GLOVE) ×1
GLOVE BIOGEL PI INDICATOR 6.5 (GLOVE) ×3
GLOVE EUDERMIC 7 POWDERFREE (GLOVE) ×4 IMPLANT
GOWN BRE IMP SLV AUR LG STRL (GOWN DISPOSABLE) ×2 IMPLANT
GOWN PREVENTION PLUS XLARGE (GOWN DISPOSABLE) ×4 IMPLANT
GOWN STRL NON-REIN LRG LVL3 (GOWN DISPOSABLE) ×6 IMPLANT
HANDLE STAPLE ENDO GIA SHORT (STAPLE) ×1
HEMOSTAT SURGICEL 2X14 (HEMOSTASIS) ×1 IMPLANT
KIT BASIN OR (CUSTOM PROCEDURE TRAY) ×2 IMPLANT
KIT ROOM TURNOVER OR (KITS) ×2 IMPLANT
KIT SUCTION CATH 14FR (SUCTIONS) ×2 IMPLANT
NS IRRIG 1000ML POUR BTL (IV SOLUTION) ×5 IMPLANT
PACK CHEST (CUSTOM PROCEDURE TRAY) ×2 IMPLANT
PAD ARMBOARD 7.5X6 YLW CONV (MISCELLANEOUS) ×4 IMPLANT
POUCH ENDO CATCH II 15MM (MISCELLANEOUS) IMPLANT
POUCH SPECIMEN RETRIEVAL 10MM (ENDOMECHANICALS) ×1 IMPLANT
RELOAD EGIA 45 MED/THCK PURPLE (STAPLE) ×3 IMPLANT
RELOAD EGIA 45 TAN VASC (STAPLE) ×3 IMPLANT
RELOAD EGIA 60 MED/THCK PURPLE (STAPLE) ×4 IMPLANT
RELOAD EGIA TRIS TAN 45 CVD (STAPLE) ×6 IMPLANT
RELOAD ENDO GIA 30 3.5 (STAPLE) ×3 IMPLANT
RELOAD STAPLE 45 TAN MED CVD (STAPLE) IMPLANT
RELOAD STAPLE 60 MED/THCK ART (STAPLE) IMPLANT
SEALANT PROGEL (MISCELLANEOUS) IMPLANT
SEALANT SURG COSEAL 4ML (VASCULAR PRODUCTS) IMPLANT
SEALANT SURG COSEAL 8ML (VASCULAR PRODUCTS) IMPLANT
SOLUTION ANTI FOG 6CC (MISCELLANEOUS) ×4 IMPLANT
SPECIMEN JAR LARGE (MISCELLANEOUS) ×1 IMPLANT
SPECIMEN JAR MEDIUM (MISCELLANEOUS) ×2 IMPLANT
SPONGE GAUZE 4X4 12PLY (GAUZE/BANDAGES/DRESSINGS) ×3 IMPLANT
STAPLER ENDO GIA 12 SHRT THIN (STAPLE) IMPLANT
STAPLER ENDO GIA 12MM SHORT (STAPLE) ×1 IMPLANT
SUT PROLENE 4 0 RB 1 (SUTURE)
SUT PROLENE 4-0 RB1 .5 CRCL 36 (SUTURE) IMPLANT
SUT SILK  1 MH (SUTURE) ×2
SUT SILK 1 MH (SUTURE) ×1 IMPLANT
SUT SILK 2 0SH CR/8 30 (SUTURE) IMPLANT
SUT SILK 3 0SH CR/8 30 (SUTURE) ×1 IMPLANT
SUT VIC AB 0 CTX 27 (SUTURE) IMPLANT
SUT VIC AB 1 CTX 27 (SUTURE) ×1 IMPLANT
SUT VIC AB 2-0 CT1 27 (SUTURE)
SUT VIC AB 2-0 CT1 TAPERPNT 27 (SUTURE) IMPLANT
SUT VIC AB 2-0 CTX 36 (SUTURE) ×1 IMPLANT
SUT VIC AB 3-0 MH 27 (SUTURE) ×2 IMPLANT
SUT VIC AB 3-0 SH 27 (SUTURE)
SUT VIC AB 3-0 SH 27X BRD (SUTURE) IMPLANT
SUT VIC AB 3-0 X1 27 (SUTURE) ×4 IMPLANT
SUT VICRYL 0 UR6 27IN ABS (SUTURE) ×4 IMPLANT
SUT VICRYL 2 TP 1 (SUTURE) ×1 IMPLANT
SWAB COLLECTION DEVICE MRSA (MISCELLANEOUS) IMPLANT
SYSTEM SAHARA CHEST DRAIN ATS (WOUND CARE) ×2 IMPLANT
TAPE CLOTH SURG 4X10 WHT LF (GAUZE/BANDAGES/DRESSINGS) ×2 IMPLANT
TIP APPLICATOR SPRAY EXTEND 16 (VASCULAR PRODUCTS) IMPLANT
TOWEL OR 17X24 6PK STRL BLUE (TOWEL DISPOSABLE) ×2 IMPLANT
TOWEL OR 17X26 10 PK STRL BLUE (TOWEL DISPOSABLE) ×4 IMPLANT
TRAP SPECIMEN MUCOUS 40CC (MISCELLANEOUS) IMPLANT
TRAY FOLEY CATH 14FR (SET/KITS/TRAYS/PACK) ×2 IMPLANT
TUBE ANAEROBIC SPECIMEN COL (MISCELLANEOUS) IMPLANT
TUNNELER SHEATH ON-Q 11GX8 (MISCELLANEOUS) IMPLANT
TUNNELER SHEATH ON-Q 16GX12 DP (PAIN MANAGEMENT) ×1 IMPLANT
WATER STERILE IRR 1000ML POUR (IV SOLUTION) ×4 IMPLANT

## 2012-11-19 NOTE — Progress Notes (Signed)
Utilization review completed.  

## 2012-11-19 NOTE — H&P (View-Only) (Signed)
PCP is Mcclure, Sara Stalker, MD Referring Provider is Sara Patch, MD  Chief Complaint  Patient presents with  . Lung Mass    Referral from Dr Sara Mcclure for surgical eval on Lung mass, Chest CT 11/03/12, PET Scan 11/06/12    HPI: Sara Mcclure is a 67 year old woman with a 50-pack-year history of smoking and a history of COPD who presents for evaluation of the left lung mass.  [name] had an episode of chest pain and shortness of breath July of this past year. She was evaluated and underwent cardiac catheterization. She did not have any significant coronary disease. She did have a chest x-ray around that time which showed a possible left lung nodule. A CT of the chest was done which confirmed a nodule in the left upper lobe lingular segment. She recently had a 6 month followup CT which showed that the nodule has increased in size. This led to a PET/CT which showed the lesion to be hypermetabolic. There is also a small adrenal lesion, which is not hypermetabolic, it appears to be consistent with an adenoma.  Sara Mcclure does have a history of breast cancer on the left side 17 years ago. This was treated with lumpectomy and axillary dissection and radiation. She's had no evidence recurrent since that time. She does have a diagnosis of COPD and uses Spiriva and Advair daily. She also has an albuterol rescue inhaler she uses occasionally, on average less than once a week. She has lost 27 pounds over the past 6 months. She attributes this to a decreased appetite, but also diet and exercise. She has minimal to no limitations on her physical activities due to her COPD.  Past Medical History  Diagnosis Date  . GERD (gastroesophageal reflux disease)   . Asthma   . Hypertension   . Diabetes mellitus   . Thyroid disease   . Anxiety   . Vertigo   . Breast cancer   . COPD (chronic obstructive pulmonary disease)     Past Surgical History  Procedure Date  . Cardiac catherization   . Breast  lumpectomy   . Tubal ligation     Family History  Problem Relation Age of Onset  . Cancer Mother   . COPD Mother   . Hypertension Mother   . COPD Father   . COPD Sister   . Cancer Sister   . COPD Brother   . Diabetes Brother   . Hyperlipidemia Brother   . Hypertension Brother   . Stroke Brother     Social History History  Substance Use Topics  . Smoking status: Current Every Day Smoker -- 1.0 packs/day for 50 years    Types: Cigarettes  . Smokeless tobacco: Not on file  . Alcohol Use: No    Current Outpatient Prescriptions  Medication Sig Dispense Refill  . albuterol (PROVENTIL HFA;VENTOLIN HFA) 108 (90 BASE) MCG/ACT inhaler Inhale 2 puffs into the lungs every 6 (six) hours as needed.      Marland Kitchen aspirin EC 81 MG tablet Take 81 mg by mouth daily.      . cholecalciferol (VITAMIN D) 1000 UNITS tablet Take 2,000 Units by mouth 2 (two) times daily.      . Fluticasone-Salmeterol (ADVAIR) 250-50 MCG/DOSE AEPB Inhale 1 puff into the lungs every 12 (twelve) hours.      . fosinopril (MONOPRIL) 10 MG tablet Take 20 mg by mouth daily.       Marland Kitchen levothyroxine (SYNTHROID, LEVOTHROID) 50 MCG tablet Take 50 mcg by  mouth daily.      . montelukast (SINGULAIR) 10 MG tablet Take 10 mg by mouth at bedtime.      . Probiotic Product (ALIGN PO) Take by mouth daily.        Allergies  Allergen Reactions  . Metformin And Related Nausea Only    Review of Systems  Constitutional: Positive for appetite change. Negative for fever and fatigue.  HENT: Positive for hearing loss.   Respiratory: Positive for shortness of breath and wheezing.   Cardiovascular: Positive for chest pain (in July 2013) and palpitations.  Gastrointestinal:       GER, hiatal hernia  Hematological: Does not bruise/bleed easily.  Psychiatric/Behavioral:       Anxiety  All other systems reviewed and are negative.    BP 159/83  Pulse 96  Resp 20  Ht 5\' 6"  (1.676 m)  Wt 139 lb (63.05 kg)  BMI 22.44 kg/m2  SpO2  97% Physical Exam  Vitals reviewed. Constitutional: She is oriented to person, place, and time. She appears well-developed and well-nourished. No distress.  HENT:  Head: Normocephalic and atraumatic.       Very hard of hearing  Eyes: EOM are normal. Pupils are equal, round, and reactive to light.  Neck: Normal range of motion. Neck supple. No thyromegaly present.       Well healed surgical scars  Pulmonary/Chest: Effort normal.       Mild end inspiratory stridor  Abdominal: Soft. There is no tenderness.  Musculoskeletal: She exhibits no edema.  Lymphadenopathy:    She has no cervical adenopathy.  Neurological: She is alert and oriented to person, place, and time. No cranial nerve deficit.  Skin: Skin is warm and dry.  Psychiatric: She has a normal mood and affect.     Diagnostic Tests: Chest CT 11/03/12 CT CHEST WITHOUT CONTRAST  Technique: Multidetector CT imaging of the chest was performed  following the standard protocol without IV contrast.  Comparison: 04/13/2012  Findings: Mild pulmonary emphysema is again demonstrated. A  peripheral pulmonary nodule with ill-defined margins is seen in the  lingula which now measures 13 mm on image 32, compared with 5 mm  previously. A second pulmonary nodule in the inferior lingula on  image 35 measures 4 mm and is not simply changed. No other new or  enlarging pulmonary nodules or masses are identified. There is no  evidence of acute infiltrate.  Shotty less than 1 cm mediastinal lymph nodes remain stable. No  pathologically enlarged nodes are seen within the thorax. Patient  has undergone previous left lumpectomy and axillary lymph node  dissection. No evidence of axillary lymphadenopathy or chest wall  mass. No evidence of pleural or pericardial effusion.  A small left adrenal nodule measuring 1.1 cm is stable and most  likely represents a small adrenal adenoma. No suspicious bone  lesions are identified.  IMPRESSION:  1.  Increased size of 13 mm peripheral pulmonary nodule in the  lingula. Bronchogenic carcinoma cannot be excluded. Consider PET  scan and/or percutaneous needle biopsy for further evaluation.  Consultation with the [facility]  [phone]) should be considered.  2. 4 mm satellite nodule within the lingula remains stable.  3. Pulmonary emphysema.  4. No evidence of lymphadenopathy or pleural effusion.   PET/CT 11/06/12 NUCLEAR MEDICINE PET SKULL BASE TO THIGH  Fasting Blood Glucose: 84  Technique: 17.8 mCi F-18 FDG was injected intravenously. CT data  was obtained and used for attenuation correction and anatomic  localization only. (This was not acquired as a diagnostic CT  examination.) Additional exam technical data entered on  technologist worksheet.  Comparison: 11/03/2012 chest CT  Findings:  Neck: No hypermetabolic lymph nodes in the neck.  Chest: The left upper lobe pulmonary nodule is hypermetabolic,  with maximum standard uptake value of 3.7. There is a vertical  misregistration, with the activity higher in position in the nodule  itself. No measurable hypermetabolic activity is observed in the  adjacent 4 mm satellite nodule. No significant hypermetabolic  activity associated with the postoperative findings in the left  breast. No significant abnormal nodal activity in the chest.  Emphysema noted.  Abdomen/Pelvis: A small low density nodule of the medial limb of  the left adrenal gland does not appear to be hypermetabolic and  favors adenoma, short axis diameter 9 mm. Aortoiliac  atherosclerotic vascular disease noted. Calcified uterine fibroids  noted.  Skeleton: No focal hypermetabolic activity to suggest skeletal  metastasis.  IMPRESSION:  1.  1. The left upper lobe pulmonary nodule of concern is  hypermetabolic, favoring malignancy. Tissue diagnosis or resection  recommended. There is a nearby 4 mm nodule which should probably   also be resected of wedge resection is performed.  2. Small left adrenal adenoma.  3. Atherosclerosis.  Cardiac catheterization 7/13 HEMODYNAMICS:  AO SYSTOLIC/AO DIASTOLIC: 128/60  LV SYSTOLIC/LV DIASTOLIC: 128/13  ANGIOGRAPHIC RESULTS:  1. Left main: Nl  2. LAD: mild luminal irregularity of 10 to <20% proximally  3. Left circumflex: Nl  4. Right coronary artery: Nl dominant vessel  5 . Left ventriculography; RAO left ventriculogram was performed using  25 mL of Visipaque dye at 12 mL/second. The overall LVEF estimated  60 - 65 % Without wall motion abnormalities.  6. Distal AortographyL: 10 - 20% right renal artery narrowing; mild luminal irregularity distal aorta.  IMPRESSION: Normal LV function  Very mild nonobstructive CAD.  REC: Medical therapy with smoking cessation, lifestyle modication.  Impression: 67 year old woman with a 50-pack-year history of smoking who has an enlarging nodule in the lingula. This is hypermetabolic by PET. This has to be considered a new primary bronchogenic carcinoma until it can be proven otherwise. I had a long discussion with the patient and her husband regarding the nodule. We discussed the differential diagnosis as well as potential diagnostic and therapeutic interventions. My recommendation to her is to proceed with left VATS, wedge resection for frozen section analysis, and then proceed with segmentectomy or lobectomy if this is confirmed to be cancerous. There is also a very small 4 mm nodule in the vicinity of the larger mass, that should be encompassed with a resection.  I discussed with the patient and her husband the general nature of the operation including the need for general anesthesia, incisions to be used, expected hospital stay, and overall recovery. We did discuss potential for cure if this does turn out to be cancerous, although they do understand there is no guarantee that would be curative. I discussed with them the indications, risks,  benefits, and alternatives. They understand that the risks include, but are not limited to, death, MI, DVT, PE, bleeding, possible need for transfusion, infection, prolonged air leak, cardiac arrhythmias, as well as other potential unforeseeable, occasions.  She understands and accepts these risks and wishes to proceed.  Plan: Pulmonary function testing with room air blood gas  Left VATS, wedge resection, possible segmentectomy or lobectomy on Thursday, 11/19/2012

## 2012-11-19 NOTE — Transfer of Care (Signed)
Immediate Anesthesia Transfer of Care Note  Patient: Sara Mcclure  Procedure(s) Performed: Procedure(s) (LRB) with comments: VIDEO ASSISTED THORACOSCOPY (VATS)/WEDGE RESECTION (Left) - left upper lobe wedge resection LOBECTOMY (Left) - left upper lobe LYMPH NODE DISSECTION (Left)  Patient Location: PACU  Anesthesia Type:General  Level of Consciousness: awake, alert  and oriented  Airway & Oxygen Therapy: Patient Spontanous Breathing and Patient connected to face mask oxygen  Post-op Assessment: Report given to PACU RN, Post -op Vital signs reviewed and stable and Patient moving all extremities X 4  Post vital signs: Reviewed and stable  Complications: No apparent anesthesia complications

## 2012-11-19 NOTE — Brief Op Note (Signed)
11/19/2012  2:23 PM  PATIENT:  Sara Mcclure  67 y.o. female  PRE-OPERATIVE DIAGNOSIS:  left upper lobe lung mass  POST-OPERATIVE DIAGNOSIS:  left upper lobe lung mass  PROCEDURE:   LEFT VIDEO ASSISTED THORACOSCOPY/MINI-THORACOTOMY, LEFT UPPER LOBECTOMY, LYMPH NODE DISSECTION  SURGEON:  Surgeon(s): Loreli Slot, MD  ASSISTANT: Coral Ceo, PA-C  ANESTHESIA:   general  SPECIMEN:  Source of Specimen:  Left upper lobe, lymph nodes  DISPOSITION OF SPECIMEN:  Pathology  DRAINS: 28 Fr, 32 Fr CTs  PATIENT CONDITION:  PACU - hemodynamically stable.

## 2012-11-19 NOTE — Anesthesia Preprocedure Evaluation (Addendum)
Anesthesia Evaluation  Patient identified by MRN, date of birth, ID band Patient awake    Reviewed: Allergy & Precautions, H&P , NPO status , Patient's Chart, lab work & pertinent test results  History of Anesthesia Complications (+) PONV  Airway Mallampati: II TM Distance: >3 FB Neck ROM: Full    Dental  (+) Dental Advisory Given, Edentulous Upper and Edentulous Lower   Pulmonary asthma , COPD COPD inhaler,  - rhonchi  + decreased breath sounds      Cardiovascular hypertension, Pt. on medications Rhythm:Regular Rate:Normal     Neuro/Psych PSYCHIATRIC DISORDERS Anxiety negative neurological ROS     GI/Hepatic Neg liver ROS, GERD-  Medicated,  Endo/Other  diabetes, Type 2, Oral Hypoglycemic Agents  Renal/GU negative Renal ROS     Musculoskeletal negative musculoskeletal ROS (+)   Abdominal   Peds  Hematology negative hematology ROS (+)   Anesthesia Other Findings   Reproductive/Obstetrics                          Anesthesia Physical Anesthesia Plan  ASA: III  Anesthesia Plan: General   Post-op Pain Management:    Induction: Intravenous  Airway Management Planned: Double Lumen EBT  Additional Equipment: Arterial line and CVP  Intra-op Plan:   Post-operative Plan: Extubation in OR  Informed Consent: I have reviewed the patients History and Physical, chart, labs and discussed the procedure including the risks, benefits and alternatives for the proposed anesthesia with the patient or authorized representative who has indicated his/her understanding and acceptance.   Dental advisory given  Plan Discussed with: CRNA  Anesthesia Plan Comments:        Anesthesia Quick Evaluation

## 2012-11-19 NOTE — Interval H&P Note (Signed)
History and Physical Interval Note:  11/19/2012 11:01 AM  Sara Mcclure  has presented today for surgery, with the diagnosis of LUL mass  The various methods of treatment have been discussed with the patient and family. After consideration of risks, benefits and other options for treatment, the patient has consented to  Procedure(s) (LRB) with comments: VIDEO ASSISTED THORACOSCOPY (VATS)/WEDGE RESECTION (Left) - Would like to follow in OR 10 LOBECTOMY (Left) - or possible segmentectomy as a surgical intervention .  The patient's history has been reviewed, patient examined, no change in status, stable for surgery.  I have reviewed the patient's chart and labs.  Questions were answered to the patient's satisfaction.     HENDRICKSON,STEVEN C  PFT OK, ABG OK

## 2012-11-19 NOTE — Anesthesia Postprocedure Evaluation (Signed)
Anesthesia Post Note  Patient: Sara Mcclure  Procedure(s) Performed: Procedure(s) (LRB): VIDEO ASSISTED THORACOSCOPY (VATS)/WEDGE RESECTION (Left) LOBECTOMY (Left) LYMPH NODE DISSECTION (Left)  Anesthesia type: general  Patient location: PACU  Post pain: Pain level controlled  Post assessment: Patient's Cardiovascular Status Stable  Last Vitals:  Filed Vitals:   11/19/12 1530  BP: 131/52  Pulse:   Temp:   Resp:     Post vital signs: Reviewed and stable  Level of consciousness: sedated  Complications: No apparent anesthesia complications

## 2012-11-19 NOTE — Brief Op Note (Signed)
11/19/2012  4:42 PM  PATIENT:  Sara Mcclure  67 y.o. female  PRE-OPERATIVE DIAGNOSIS:  left upper lobe lung mass  POST-OPERATIVE DIAGNOSIS:  left upper lobe lung mass  PROCEDURE:  Procedure(s) (LRB) with comments: VIDEO ASSISTED THORACOSCOPY (VATS)/WEDGE RESECTION (Left) - left upper lobe wedge resection LOBECTOMY (Left) - left upper lobe LYMPH NODE DISSECTION (Left)  SURGEON:  Surgeon(s) and Role:    * Loreli Slot, MD - Primary  PHYSICIAN ASSISTANT: Coral Ceo, PA-C  ANESTHESIA:   general  EBL:  Total I/O In: 2000 [I.V.:2000] Out: 470 [Urine:400; Chest Tube:70]  BLOOD ADMINISTERED:none  DRAINS: 2 Chest Tube(s) in the left pleura   LOCAL MEDICATIONS USED:  MARCAINE     SPECIMEN:  Source of Specimen:  LUL wedge, completion LUL, level 4L, 5, 6 7 10  and 11 LN  DISPOSITION OF SPECIMEN:  PATHOLOGY  COUNTS:  YES  PLAN OF CARE: Admit to inpatient   PATIENT DISPOSITION:  PACU - hemodynamically stable.   Delay start of Pharmacological VTE agent (>24hrs) due to surgical blood loss or risk of bleeding: yes

## 2012-11-20 ENCOUNTER — Inpatient Hospital Stay (HOSPITAL_COMMUNITY): Payer: Medicare Other

## 2012-11-20 LAB — BASIC METABOLIC PANEL
BUN: 7 mg/dL (ref 6–23)
Calcium: 9.2 mg/dL (ref 8.4–10.5)
Creatinine, Ser: 0.56 mg/dL (ref 0.50–1.10)
GFR calc non Af Amer: >90 mL/min (ref >90)
Glucose, Bld: 152 mg/dL — ABNORMAL HIGH (ref 70–99)
Sodium: 136 mEq/L (ref 135–145)

## 2012-11-20 LAB — CBC
MCH: 30.7 pg (ref 26.0–34.0)
Platelets: 175 10*3/uL (ref 150–400)
RBC: 4.37 MIL/uL (ref 3.87–5.11)
WBC: 15 10*3/uL — ABNORMAL HIGH (ref 4.0–10.5)

## 2012-11-20 LAB — GLUCOSE, CAPILLARY: Glucose-Capillary: 138 mg/dL — ABNORMAL HIGH (ref 70–99)

## 2012-11-20 LAB — BLOOD GAS, ARTERIAL
Acid-base deficit: 0.3 mmol/L (ref 0.0–2.0)
Drawn by: 362741
O2 Content: 1 L/min
Patient temperature: 98.6
pCO2 arterial: 39.4 mmHg (ref 35.0–45.0)

## 2012-11-20 MED ORDER — INSULIN ASPART 100 UNIT/ML ~~LOC~~ SOLN
0.0000 [IU] | Freq: Three times a day (TID) | SUBCUTANEOUS | Status: DC
  Administered 2012-11-20: 3 [IU] via SUBCUTANEOUS
  Administered 2012-11-20 – 2012-11-21 (×3): 2 [IU] via SUBCUTANEOUS

## 2012-11-20 MED ORDER — ALBUTEROL SULFATE HFA 108 (90 BASE) MCG/ACT IN AERS
2.0000 | INHALATION_SPRAY | Freq: Four times a day (QID) | RESPIRATORY_TRACT | Status: AC
  Administered 2012-11-20 – 2012-11-21 (×3): 2 via RESPIRATORY_TRACT
  Filled 2012-11-20: qty 6.7

## 2012-11-20 MED ORDER — SODIUM CHLORIDE 0.9 % IJ SOLN
10.0000 mL | INTRAMUSCULAR | Status: DC | PRN
  Filled 2012-11-20: qty 10
  Filled 2012-11-20: qty 40
  Filled 2012-11-20: qty 10
  Filled 2012-11-20: qty 20

## 2012-11-20 MED ORDER — SODIUM CHLORIDE 0.9 % IJ SOLN
10.0000 mL | Freq: Two times a day (BID) | INTRAMUSCULAR | Status: DC
  Administered 2012-11-20 – 2012-11-24 (×11): 10 mL
  Filled 2012-11-20: qty 20
  Filled 2012-11-20 (×2): qty 10
  Filled 2012-11-20: qty 20

## 2012-11-20 MED ORDER — KETOROLAC TROMETHAMINE 30 MG/ML IJ SOLN
30.0000 mg | Freq: Four times a day (QID) | INTRAMUSCULAR | Status: AC | PRN
  Administered 2012-11-20 – 2012-11-21 (×3): 30 mg via INTRAVENOUS
  Filled 2012-11-20 (×3): qty 1

## 2012-11-20 MED ORDER — ONDANSETRON HCL 4 MG/2ML IJ SOLN
4.0000 mg | INTRAMUSCULAR | Status: DC | PRN
  Administered 2012-11-20 – 2012-11-22 (×6): 4 mg via INTRAVENOUS
  Filled 2012-11-20 (×6): qty 2

## 2012-11-20 NOTE — Progress Notes (Addendum)
1 Day Post-Op Procedure(s) (LRB): VIDEO ASSISTED THORACOSCOPY (VATS)/WEDGE RESECTION (Left) LOBECTOMY (Left) LYMPH NODE DISSECTION (Left) Subjective:  Ms. Tarlton complains of pain this morning.  She is somnolent this morning.  Objective: Vital signs in last 24 hours: Temp:  [96.8 F (36 C)-98.7 F (37.1 C)] 98 F (36.7 C) (02/07 0737) Pulse Rate:  [65-89] 85  (02/07 0325) Cardiac Rhythm:  [-] Normal sinus rhythm (02/07 0805) Resp:  [11-25] 25  (02/07 0325) BP: (115-161)/(45-74) 161/64 mmHg (02/07 0737) SpO2:  [93 %-100 %] 96 % (02/07 0824) Arterial Line BP: (131-145)/(68-112) 131/112 mmHg (02/06 2000) Weight:  [143 lb 4.8 oz (65 kg)] 143 lb 4.8 oz (65 kg) (02/07 0800)  Intake/Output from previous day: 02/06 0701 - 02/07 0700 In: 3680 [P.O.:30; I.V.:3400; IV Piggyback:250] Out: 1675 [Urine:1325; Chest Tube:350] Intake/Output this shift: Total I/O In: 100 [I.V.:100] Out: 475 [Urine:475]  General appearance: cooperative, no distress and somnolent Heart: regular rate and rhythm Lungs: diminished on the left Abdomen: soft, non-tender; bowel sounds normal; no masses,  no organomegaly Wound: clean and dry  Lab Results:  Basename 11/20/12 0345 11/17/12 1532  WBC 15.0* 11.0*  HGB 13.4 14.9  HCT 39.3 42.5  PLT 175 201   BMET:  Basename 11/20/12 0345 11/17/12 1532  NA 136 137  K 3.9 4.0  CL 103 105  CO2 27 20  GLUCOSE 152* 144*  BUN 7 12  CREATININE 0.56 0.61  CALCIUM 9.2 10.1    PT/INR:  Basename 11/17/12 1532  LABPROT 12.4  INR 0.93   ABG    Component Value Date/Time   PHART 7.400 11/20/2012 0345   HCO3 23.9 11/20/2012 0345   TCO2 25.1 11/20/2012 0345   ACIDBASEDEF 0.3 11/20/2012 0345   O2SAT 97.6 11/20/2012 0345   CBG (last 3)   Basename 11/19/12 1456 11/19/12 0742  GLUCAP 128* 111*    Assessment/Plan: S/P Procedure(s) (LRB): VIDEO ASSISTED THORACOSCOPY (VATS)/WEDGE RESECTION (Left) LOBECTOMY (Left) LYMPH NODE DISSECTION (Left)  1. Chest tubes- 3+air  leak, leave on suction, 350cc since surgery, output now frothy 2. Somnolence- likely due to narcotics, will D/C PCA change patients medication regimen 3. D/C Arterial Line 4. Leave Foley for now- until patient alert and able to get out of bed to use bathroom 5. Dispo- continue current care, CXR in AM   LOS: 1 day    BARRETT, ERIN 11/20/2012  Patient seen and examined Her air leak is a little bigger than it was yesterday. Lung is up on CXR Path pending

## 2012-11-20 NOTE — Op Note (Signed)
NAMEJOSEY, DETTMANN NO.:  [id number]  MEDICAL RECORD NO.:  [id number]  LOCATION:  3303                         FACILITY:  [facility]  PHYSICIAN:  Salvatore Decent. Dorris Fetch, M.D.DATE OF BIRTH:  04-11-1946  DATE OF PROCEDURE:  11/19/2012 DATE OF DISCHARGE:                              OPERATIVE REPORT   PREOPERATIVE DIAGNOSIS:  Left upper lobe mass.  POSTOPERATIVE DIAGNOSIS:  Stage IA non-small-cell lung cancer.  PROCEDURE:  Left video-assisted thoracoscopy, wedge resection, left upper lobectomy, mediastinal lymph node dissection, placement of ON-Q local anesthetic catheter.  SURGEON:  Loreli Slot, MD.  ASSISTANT:  Coral Ceo, PA.  ANESTHESIA:  General.  FINDINGS:  Small mass on the lateral aspect of the left upper lobe in the lingular segment.  Frozen section revealed non-small cell carcinoma.  Left upper lobectomy completed.  CLINICAL NOTE:  This patient is a 67 year old woman with a 50-pack-year history of smoking, who had a left upper lobe nodule that was increasing in size by CT and on PET the lesion was hypermetabolic.  She was advised to undergo surgical resection of the lesion with a wedge resection to be followed by lobectomy or segmentectomy if the lesion was in fact cancerous per pulmonary function it was adequate to tolerate the lobectomy.  The indications, risks, benefits, and alternatives were discussed in detail with the patient.  She understood and accepted the risk of surgery and agreed to proceed.  OPERATIVE NOTE:  This patient was brought to the preoperative holding area on November 19, 2012.  There, anesthesia placed a central line and arterial blood pressure monitoring line.  She was taken to the operating room.  Intravenous antibiotics were administered.  PAS hose were placed for DVT prophylaxis.  She was anesthetized and intubated with a double- lumen endotracheal tube.  A Foley catheter was placed, it was placed in right  lateral decubitus position and the left chest was prepped and draped in the usual sterile fashion.  Single lung ventilation of the right lung was carried down and was tolerated well throughout the procedure.  An incision was made in the eighth intercostal space and the midaxillary line was carried through the skin and subcutaneous tissue.  The chest was entered bluntly using hemostat.  The port was inserted and the thoracoscope was placed into the chest.  A small working incision was made anterior laterally.  No rib spreading was performed during the procedure.  The lingula was grasped and palpated and there was a palpable mass on the lateral aspect of the lingula.  There was some indentation of the overlying visceral pleura.  A wedge resection was performed with sequential firings of Endo- GIA stapler.  The specimen was placed into an endoscopic retrieval bag and removed through the incision.  The specimen was sent for frozen section, it was highly suspicious.  The frozen section returned invasive non-small-cell carcinoma, and the decision was made to proceed with a lobectomy.  The inferior pulmonary ligament was divided.  There was no visible level 9 lymph nodes.  The pleural reflection was divided at the hilum anteriorly exposing the superior pulmonary vein which was dissected out and encircled.  A second small port type incision  was made adjacent to the original incision and a stapler was placed across the 45 mm stapler was used to divide the superior pulmonary vein.  The dissection was carried into the fissure where the main branch of the pulmonary artery was identified in the lingular and basilar segmental arterial branches were dissected out.  The fissure was completed. Inferiorly and initially with an Endo-GIA stapler.  The lingular branches arose as a single trunk the dissection was now carried to the pleural reflection at the hilum more superiorly exposing the main pulmonary  artery and the anterior and apical posterior segmental arterial branches, these were dissected out taking the lymph nodes as separate specimens and they also were discussed divided with endoscopic vascular staplers the lingular segmental branch was divided likewise with an Endo-GIA vascular stapler.  Next, the bronchus was dissected out.  Again level 10 and 11 nodes were encountered and taken as separate specimens.  After carefully encircling the bronchus and endoscopic GIA stapler was placed across the left upper lobe bronchus and closed.  A test inflation showed good inflation of all segments of the lower lobe.  The stapler then was fired dividing the bronchus.  The remainder of the superior posterior portion of the fissure was completed with sequential firings of an Endo-GIA stapler, and the remainder of the left upper lobe was brought through the working incision and sent for permanent pathology.  Level 7 lymph nodes were dissected out.  Along with level 5 and 6 nodes, these were all sent as separate specimens.  Chest was copiously irrigated with warm saline.  A test inflation showed no leak from the bronchial stump, but there was some minor air leak from the staple lines.  The saline was evacuated and the final inspection was made for hemostasis.  A 28-French chest tube, and a 32-French Blake drain were placed through the port incisions and secured with #1 silk sutures.  An ON-Q local anesthetic catheter was placed via separate stab wound incision posteriorly and tunneled and any subpleural fashion.  The left lower lobe was reinflated.  The anterior utility incision was closed with #1 Vicryl fascial suture followed by 2-0 Vicryl subcutaneous suture and a 3-0 Vicryl subcuticular suture.  Dermabond was applied to the wound.  The patient was placed back in a supine position.  She was extubated in the operating room and taken to the postanesthetic care unit in good  condition.     Salvatore Decent Dorris Fetch, M.D.     SCH/MEDQ  D:  11/20/2012  T:  11/20/2012  Job:  [id number]

## 2012-11-20 NOTE — Progress Notes (Addendum)
Wasted 49ml-PCA Fentanyl d/c'd per MD order. Witnessed by Osvaldo Human, RN

## 2012-11-21 ENCOUNTER — Inpatient Hospital Stay (HOSPITAL_COMMUNITY): Payer: Medicare Other

## 2012-11-21 LAB — COMPREHENSIVE METABOLIC PANEL
ALT: 16 U/L (ref 0–35)
AST: 15 U/L (ref 0–37)
Albumin: 3 g/dL — ABNORMAL LOW (ref 3.5–5.2)
Alkaline Phosphatase: 65 U/L (ref 39–117)
CO2: 27 mEq/L (ref 19–32)
Chloride: 105 mEq/L (ref 96–112)
GFR calc non Af Amer: >90 mL/min (ref >90)
Potassium: 3.8 mEq/L (ref 3.5–5.1)
Sodium: 140 mEq/L (ref 135–145)
Total Bilirubin: 0.4 mg/dL (ref 0.3–1.2)

## 2012-11-21 LAB — CBC
MCV: 90.3 fL (ref 78.0–100.0)
Platelets: 163 10*3/uL (ref 150–400)
RBC: 4.53 MIL/uL (ref 3.87–5.11)
WBC: 13.6 10*3/uL — ABNORMAL HIGH (ref 4.0–10.5)

## 2012-11-21 LAB — GLUCOSE, CAPILLARY
Glucose-Capillary: 106 mg/dL — ABNORMAL HIGH (ref 70–99)
Glucose-Capillary: 136 mg/dL — ABNORMAL HIGH (ref 70–99)

## 2012-11-21 MED ORDER — METOPROLOL TARTRATE 1 MG/ML IV SOLN: 2.5000 mg | Freq: Four times a day (QID) | INTRAVENOUS | Status: DC

## 2012-11-21 MED ORDER — PROMETHAZINE HCL 25 MG/ML IJ SOLN: 12.5000 mg | Freq: Four times a day (QID) | INTRAMUSCULAR | Status: DC | PRN

## 2012-11-21 MED ORDER — ENOXAPARIN SODIUM 30 MG/0.3ML ~~LOC~~ SOLN
40.0000 mg | SUBCUTANEOUS | Status: DC
  Filled 2012-11-21: qty 0.4

## 2012-11-21 MED ORDER — LEVALBUTEROL HCL 0.63 MG/3ML IN NEBU
0.6300 mg | INHALATION_SOLUTION | Freq: Four times a day (QID) | RESPIRATORY_TRACT | Status: DC | PRN
  Filled 2012-11-21: qty 3

## 2012-11-21 MED ORDER — METOPROLOL TARTRATE 25 MG PO TABS: 25.0000 mg | ORAL_TABLET | Freq: Two times a day (BID) | ORAL | Status: DC

## 2012-11-21 MED ORDER — MECLIZINE HCL 25 MG PO TABS
25.0000 mg | ORAL_TABLET | Freq: Three times a day (TID) | ORAL | Status: DC | PRN
  Administered 2012-11-21 – 2012-11-22 (×2): 25 mg via ORAL
  Filled 2012-11-21 (×3): qty 1

## 2012-11-21 MED ORDER — METOPROLOL TARTRATE 25 MG PO TABS
25.0000 mg | ORAL_TABLET | Freq: Two times a day (BID) | ORAL | Status: DC
  Administered 2012-11-21 – 2012-11-25 (×8): 25 mg via ORAL
  Filled 2012-11-21 (×9): qty 1

## 2012-11-21 MED ORDER — METOCLOPRAMIDE HCL 5 MG/ML IJ SOLN
10.0000 mg | Freq: Four times a day (QID) | INTRAMUSCULAR | Status: AC
  Administered 2012-11-22 (×3): 10 mg via INTRAVENOUS
  Filled 2012-11-21 (×5): qty 2

## 2012-11-21 MED ORDER — ENOXAPARIN SODIUM 40 MG/0.4ML ~~LOC~~ SOLN
40.0000 mg | SUBCUTANEOUS | Status: DC
  Administered 2012-11-21 – 2012-11-24 (×4): 40 mg via SUBCUTANEOUS
  Filled 2012-11-21 (×5): qty 0.4

## 2012-11-21 MED ORDER — LEVALBUTEROL HCL 0.63 MG/3ML IN NEBU
0.6300 mg | INHALATION_SOLUTION | Freq: Three times a day (TID) | RESPIRATORY_TRACT | Status: DC
  Administered 2012-11-21: 0.63 mg via RESPIRATORY_TRACT
  Filled 2012-11-21 (×4): qty 3

## 2012-11-21 MED ORDER — HYDRALAZINE HCL 20 MG/ML IJ SOLN
10.0000 mg | Freq: Four times a day (QID) | INTRAMUSCULAR | Status: DC | PRN
  Administered 2012-11-21 – 2012-11-22 (×2): 10 mg via INTRAVENOUS
  Filled 2012-11-21 (×2): qty 1

## 2012-11-21 NOTE — Progress Notes (Signed)
Notified Dr. Dorris Fetch that chest xray report states there's an increase in pneumothorax on left side with pt's chest tubes on waterseal; pt sat 87-90% on RA and had to be placed on 2L [place]. Order received to put pt back to 20cm suction. Renette Butters, Viona Gilmore

## 2012-11-21 NOTE — Progress Notes (Addendum)
2 Days Post-Op Procedure(s) (LRB): left upper lobe wedge resection (Left) left upper lobe (Left) LYMPH NODE DISSECTION (Left) Subjective:  Sara Mcclure is much more alert this morning.  States she is feeling better.   Objective: Vital signs in last 24 hours: Temp:  [97.8 F (36.6 C)-98.4 F (36.9 C)] 98 F (36.7 C) (02/08 0700) Pulse Rate:  [71-87] 86 (02/08 0745) Cardiac Rhythm:  [-] Normal sinus rhythm (02/08 0400) Resp:  [18-21] 21 (02/08 0745) BP: (156-173)/(65-85) 171/85 mmHg (02/08 0745) SpO2:  [93 %-97 %] 94 % (02/08 0745)  Intake/Output from previous day: 02/07 0701 - 02/08 0700 In: 1560 [P.O.:160; I.V.:1400] Out: 4210 [Urine:4050; Chest Tube:160] Intake/Output this shift: Total I/O In: 100 [I.V.:100] Out: -   General appearance: alert, cooperative and no distress Heart: regular rate and rhythm Lungs: diminished breath sounds on left Abdomen: soft, non-tender; bowel sounds normal; no masses,  no organomegaly Wound: clean and dry, appears to be air from posterior chest tube  Lab Results:  Recent Labs  11/20/12 0345 11/21/12 0445  WBC 15.0* 13.6*  HGB 13.4 13.8  HCT 39.3 40.9  PLT 175 163   BMET:  Recent Labs  11/20/12 0345 11/21/12 0445  NA 136 140  K 3.9 3.8  CL 103 105  CO2 27 27  GLUCOSE 152* 122*  BUN 7 6  CREATININE 0.56 0.55  CALCIUM 9.2 9.3    PT/INR: No results found for this basename: LABPROT, INR,  in the last 72 hours ABG    Component Value Date/Time   PHART 7.400 11/20/2012 0345   HCO3 23.9 11/20/2012 0345   TCO2 25.1 11/20/2012 0345   ACIDBASEDEF 0.3 11/20/2012 0345   O2SAT 97.6 11/20/2012 0345   CBG (last 3)   Recent Labs  11/20/12 1139 11/20/12 1640 11/20/12 2100  GLUCAP 171* 138* 113*    Assessment/Plan: S/P Procedure(s) (LRB): left upper lobe wedge resection (Left) left upper lobe (Left) LYMPH NODE DISSECTION (Left)  1. Chest tube- continued air leak, 160cc output, will leave chest tubes on suction 2. CV- HTN- on home  Lisinopril dose, will add prn hydralazine 3. Pulm- continue IS, inhaler, nebulizers 4. D/C Foley 5. Decrease IV Fluids 6. Pain control- continue Percocet, Toradol      LOS: 2 days    BARRETT, ERIN 11/21/2012  Patient seen and examined. She does have an air leak but is sucking air around the posterior tube Will try on water seal Path still pending

## 2012-11-21 NOTE — Progress Notes (Signed)
Dr. Dorris Fetch notified that pt's HR has been going into 140s (sinus rhythm) with little movement; underlying rhythm has been increasing to 90s and 100s at rest. Also notified of increased O2 requirements and high BPs. Plan of care -- Dr. Dorris Fetch says he is on his way to the hospital and will put some orders in soon. Renette Butters, Viona Gilmore

## 2012-11-22 ENCOUNTER — Inpatient Hospital Stay (HOSPITAL_COMMUNITY): Payer: Medicare Other

## 2012-11-22 LAB — GLUCOSE, CAPILLARY
Glucose-Capillary: 106 mg/dL — ABNORMAL HIGH (ref 70–99)
Glucose-Capillary: 88 mg/dL (ref 70–99)

## 2012-11-22 MED ORDER — AMLODIPINE BESYLATE 5 MG PO TABS
5.0000 mg | ORAL_TABLET | Freq: Every day | ORAL | Status: DC
  Administered 2012-11-22 – 2012-11-25 (×4): 5 mg via ORAL
  Filled 2012-11-22 (×4): qty 1

## 2012-11-22 NOTE — Progress Notes (Addendum)
3 Days Post-Op Procedure(s) (LRB): left upper lobe wedge resection (Left) left upper lobe (Left) LYMPH NODE DISSECTION (Left) Subjective:  Sara Mcclure states she feel okay this morning.   Objective: Vital signs in last 24 hours: Temp:  [97.9 F (36.6 C)-98.3 F (36.8 C)] 98.1 F (36.7 C) (02/09 0740) Pulse Rate:  [70-128] 87 (02/09 0740) Cardiac Rhythm:  [-] Normal sinus rhythm (02/09 0740) Resp:  [19-30] 29 (02/09 0740) BP: (154-180)/(72-86) 180/76 mmHg (02/09 0740) SpO2:  [88 %-95 %] 93 % (02/09 0813) FiO2 (%):  [94 %] 94 % (02/08 2122)  Intake/Output from previous day: 02/08 0701 - 02/09 0700 In: 1510 [P.O.:360; I.V.:1150] Out: 1900 [Urine:1700; Chest Tube:200] Intake/Output this shift: Total I/O In: -  Out: 140 [Chest Tube:140]  General appearance: alert, cooperative and no distress Heart: regular rate and rhythm Lungs: coarse bilaterally Abdomen: soft, non-tender; bowel sounds normal; no masses,  no organomegaly Wound: clean and dyr  Lab Results:  Recent Labs  11/20/12 0345 11/21/12 0445  WBC 15.0* 13.6*  HGB 13.4 13.8  HCT 39.3 40.9  PLT 175 163   BMET:  Recent Labs  11/20/12 0345 11/21/12 0445  NA 136 140  K 3.9 3.8  CL 103 105  CO2 27 27  GLUCOSE 152* 122*  BUN 7 6  CREATININE 0.56 0.55  CALCIUM 9.2 9.3    PT/INR: No results found for this basename: LABPROT, INR,  in the last 72 hours ABG    Component Value Date/Time   PHART 7.400 11/20/2012 0345   HCO3 23.9 11/20/2012 0345   TCO2 25.1 11/20/2012 0345   ACIDBASEDEF 0.3 11/20/2012 0345   O2SAT 97.6 11/20/2012 0345   CBG (last 3)   Recent Labs  11/21/12 1129 11/21/12 1647 11/21/12 2121  GLUCAP 106* 136* 104*    Assessment/Plan: S/P Procedure(s) (LRB): left upper lobe wedge resection (Left) left upper lobe (Left) LYMPH NODE DISSECTION (Left)  1. Chest tube with continued air leak- did trial of tube to water seal yesterday with interval increase in pneumothorax, CXR this morning, shows  improvement of pneumothorax, will leave tubes on suction for now 2. CV- patient with tachycardia yesterday, continued Hypertension this morning, on Lopressor and Lisinopril, will add Norvasc 3. Pulm- continue nebulizer treatment, pulm toilet 4. D/C IV Fluids 5. Dispo- continue current care   LOS: 3 days    BARRETT, ERIN 11/22/2012   Patient seen and examined Agree with above Will dc posterior CT- leave anterior CT to suction for now

## 2012-11-23 ENCOUNTER — Inpatient Hospital Stay (HOSPITAL_COMMUNITY): Payer: Medicare Other

## 2012-11-23 ENCOUNTER — Encounter (HOSPITAL_COMMUNITY): Payer: Self-pay | Admitting: Thoracic Surgery (Cardiothoracic Vascular Surgery)

## 2012-11-23 LAB — GLUCOSE, CAPILLARY
Glucose-Capillary: 93 mg/dL (ref 70–99)
Glucose-Capillary: 85 mg/dL (ref 70–99)

## 2012-11-23 NOTE — Progress Notes (Signed)
4 Days Post-Op Procedure(s) (LRB): VIDEO ASSISTED THORACOSCOPY (VATS)/WEDGE RESECTION (Left) LOBECTOMY (Left) LYMPH NODE DISSECTION (Left) Subjective: [place] "OK" today  Objective: Vital signs in last 24 hours: Temp:  [98 F (36.7 C)-98.8 F (37.1 C)] 98.2 F (36.8 C) (02/10 0744) Pulse Rate:  [74-98] 74 (02/10 0322) Cardiac Rhythm:  [-] Normal sinus rhythm (02/09 2020) Resp:  [19-26] 19 (02/10 0322) BP: (130-178)/(71-86) 149/78 mmHg (02/10 0322) SpO2:  [91 %-97 %] 97 % (02/10 0322)  Hemodynamic parameters for last 24 hours:    Intake/Output from previous day: 02/09 0701 - 02/10 0700 In: -  Out: 2160 [Urine:1900; Chest Tube:260] Intake/Output this shift:    General appearance: alert and no distress Heart: regular rate and rhythm Lungs: wheezes bilaterally Wound: clean and dry air leak markedly decreased  Lab Results:  Recent Labs  11/21/12 0445  WBC 13.6*  HGB 13.8  HCT 40.9  PLT 163   BMET:  Recent Labs  11/21/12 0445  NA 140  K 3.8  CL 105  CO2 27  GLUCOSE 122*  BUN 6  CREATININE 0.55  CALCIUM 9.3    PT/INR: No results found for this basename: LABPROT, INR,  in the last 72 hours ABG    Component Value Date/Time   PHART 7.400 11/20/2012 0345   HCO3 23.9 11/20/2012 0345   TCO2 25.1 11/20/2012 0345   ACIDBASEDEF 0.3 11/20/2012 0345   O2SAT 97.6 11/20/2012 0345   CBG (last 3)   Recent Labs  11/22/12 1227 11/22/12 1708 11/22/12 2151  GLUCAP 96 106* 88    Assessment/Plan: S/P Procedure(s) (LRB): VIDEO ASSISTED THORACOSCOPY (VATS)/WEDGE RESECTION (Left) LOBECTOMY (Left) LYMPH NODE DISSECTION (Left) POD # 4 LUL CV- HTN better controlled  RESP- some wheezing this AM, nebs PRN  CXR shows good expansion of LLL  Air leak nearly resolved- I think she was sucking air around the [place] drain previously   LUNG CANCER- path still pending  Increase ambulation   LOS: 4 days    Sanyla Summey C 11/23/2012

## 2012-11-24 ENCOUNTER — Inpatient Hospital Stay (HOSPITAL_COMMUNITY): Payer: Medicare Other

## 2012-11-24 LAB — GLUCOSE, CAPILLARY
Glucose-Capillary: 113 mg/dL — ABNORMAL HIGH (ref 70–99)
Glucose-Capillary: 82 mg/dL (ref 70–99)
Glucose-Capillary: 118 mg/dL — ABNORMAL HIGH (ref 70–99)

## 2012-11-24 MED ORDER — AMLODIPINE BESYLATE 5 MG PO TABS: 5.0000 mg | ORAL_TABLET | Freq: Every day | ORAL | Status: DC

## 2012-11-24 MED ORDER — METOPROLOL TARTRATE 25 MG PO TABS: 25.0000 mg | ORAL_TABLET | Freq: Two times a day (BID) | ORAL | Status: AC

## 2012-11-24 MED ORDER — NON FORMULARY: 20.0000 mg | Freq: Every day | Status: DC | PRN

## 2012-11-24 MED ORDER — OXYCODONE-ACETAMINOPHEN 5-325 MG PO TABS: 1.0000 | ORAL_TABLET | ORAL | Status: DC | PRN

## 2012-11-24 MED ORDER — RABEPRAZOLE SODIUM 20 MG PO TBEC
20.0000 mg | DELAYED_RELEASE_TABLET | Freq: Every day | ORAL | Status: DC
  Administered 2012-11-25: 20 mg via ORAL
  Filled 2012-11-24 (×2): qty 1

## 2012-11-24 NOTE — Discharge Summary (Signed)
Physician Discharge Summary  Patient ID: ANGI GOODELL MRN: [id number] DOB/AGE: 1946-01-13 67 y.o.  Admit date: 11/19/2012 Discharge date: 11/24/2012  Admission Diagnoses:  Patient Active Problem List  Diagnosis  . GERD (gastroesophageal reflux disease)  . Anxiety  . Vertigo  . Abdominal pain  . Positive cardiac stress test, 04/10/12  . Left arm pain, forearm  . Tobacco abuse  . HTN (hypertension)  . Diabetes mellitus  . History of breast cancer: 17 yrs ago.   Discharge Diagnoses:   Patient Active Problem List  Diagnosis  . GERD (gastroesophageal reflux disease)  . Anxiety  . Vertigo  . Abdominal pain  . Positive cardiac stress test, 04/10/12  . Left arm pain, forearm  . Tobacco abuse  . HTN (hypertension)  . Diabetes mellitus  . History of breast cancer: 17 yrs ago.   Discharged Condition: good  History of Present Illness:   Ms. Cressler is a 67 yo white female with known history of Breast Cancer, COPD with 50 pack year smoking history.  She was referred to Dr. Sunday Corn office for evaluation of a left lung mass.  The patient initially suffered an episode of chest pain and shortness of breath July of 2013.  She underwent cardiac catheterization which did not show any evidence of coronary disease.  She also had a chest xray done around that time that did show evidence of a possible left lung nodule.  Follow up CT scan was obtained and confirmed the presence of a left upper lobe nodule located in the lingular segment.  The patient underwent repeat CT scan approximately 6 months later which showed the nodule had increased in size.  Due to this PET CT scan was obtained and showed hypermetabolic activity.  The patient was evaluated by Dr. Dorris Fetch on 11/12/2012 at which time the patient admitted to a 27 pound weight loss over the past 6 months.  The patient attributes this to decreased appetite, diet and exercise.  Her imaging studies were reviewed and it was felt the patient  would benefit from surgical resection.  The benefits and risks of the procedure were explained to the patient and she was agreeable to proceed with surgery scheduled for 11/19/2012.      Hospital Course:   Ms. Connett presented to the hospital on 11/19/2012.  She was taken to the operating room and underwent Left VATS with Mini Thoracotomy, Left Upper Lobectomy and Lymph Node Dissection.  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  The patient tolerated the procedure well was extubated and taken to the PACU in stable condition.  The patient received a dose of IV Fentanyl the morning after surgery and the patient became very somnolent.  She was taken off her PCA and her pain medication was switched to an oral regimen.  Her chest tube had a large air leak present.  After exam of the chest tube insertion site it was determined that the posterior chest tube seemed to be the source of the air leak.  CXR confirmed the lung was fully inflated with no evidence of pneumothorax.  Due to this the posterior chest tube was removed with improvement of air leak.  The patient's air leak has resolved.  Her chest tube has been placed on water seal.  We will repeat a CXR this afternoon and should there be no evidence of pneumothorax we will remove the patient's chest tube.  The patient continues to require oxygen which we will  wean off as tolerated.  We will continue aggressive pulmonary toilet regimen to assist with the patient's post operative atelectasis.  The patient is medically stable at this time.  Should no further issues arise we will plan for discharge in the next 24-48 hours. He will need to follow up with Dr. Dorris Fetch in 2 weeks with a chest xray prior to her appointment.  Final pathology has not yet been reported.   Treatments: surgery:   Left video-assisted thoracoscopy, wedge resection, left  upper lobectomy, mediastinal lymph node dissection, placement  of ON-Q  local anesthetic catheter.  Disposition: 01-Home or Self Care    Medication List    TAKE these medications       albuterol 108 (90 BASE) MCG/ACT inhaler  Commonly known as:  PROVENTIL HFA;VENTOLIN HFA  Inhale 2 puffs into the lungs every 6 (six) hours as needed. For shortness of breath     albuterol 1.25 MG/3ML nebulizer solution  Commonly known as:  ACCUNEB  Take 1 ampule by nebulization 2 (two) times daily as needed. For shortness of breath     ALIGN PO  Take 1 capsule by mouth at bedtime.     amLODipine 5 MG tablet  Commonly known as:  NORVASC  Take 1 tablet (5 mg total) by mouth daily.     aspirin EC 81 MG tablet  Take 81 mg by mouth daily.     Fluticasone-Salmeterol 250-50 MCG/DOSE Aepb  Commonly known as:  ADVAIR  Inhale 1 puff into the lungs every 12 (twelve) hours.     fosinopril 20 MG tablet  Commonly known as:  MONOPRIL  Take 20 mg by mouth daily.     levothyroxine 50 MCG tablet  Commonly known as:  SYNTHROID, LEVOTHROID  Take 50 mcg by mouth daily.     metoprolol tartrate 25 MG tablet  Commonly known as:  LOPRESSOR  Take 1 tablet (25 mg total) by mouth 2 (two) times daily.     montelukast 10 MG tablet  Commonly known as:  SINGULAIR  Take 10 mg by mouth at bedtime.     omeprazole-sodium bicarbonate 40-1100 MG per capsule  Commonly known as:  ZEGERID  Take 1 capsule by mouth daily before breakfast.     oxyCODONE-acetaminophen 5-325 MG per tablet  Commonly known as:  PERCOCET/ROXICET  Take 1-2 tablets by mouth every 4 (four) hours as needed.     sucralfate 1 GM/10ML suspension  Commonly known as:  CARAFATE  Take 1 g by mouth 4 (four) times daily -  with meals and at bedtime.     Vitamin D 2000 UNITS tablet  Take 2,000 Units by mouth 2 (two) times daily.         SignedLowella Dandy 11/24/2012, 9:39 AM

## 2012-11-24 NOTE — Progress Notes (Signed)
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

## 2012-11-24 NOTE — Progress Notes (Signed)
Utilization review completed.  

## 2012-11-24 NOTE — Progress Notes (Addendum)
5 Days Post-Op Procedure(s) (LRB): VIDEO ASSISTED THORACOSCOPY (VATS)/WEDGE RESECTION (Left) LOBECTOMY (Left) LYMPH NODE DISSECTION (Left) Subjective:  No new complaints, feels better + diarrhea overnight  Objective: Vital signs in last 24 hours: Temp:  [97.7 F (36.5 C)-99 F (37.2 C)] 97.7 F (36.5 C) (02/11 0739) Pulse Rate:  [59-95] 71 (02/11 0739) Cardiac Rhythm:  [-] Normal sinus rhythm (02/11 0739) Resp:  [16-30] 30 (02/11 0739) BP: (109-132)/(56-69) 118/60 mmHg (02/11 0739) SpO2:  [88 %-97 %] 93 % (02/11 0739)  Intake/Output from previous day: 02/10 0701 - 02/11 0700 In: 1000 [P.O.:960; I.V.:40] Out: 965 [Urine:700; Stool:5; Chest Tube:260]  General appearance: alert, cooperative and no distress Heart: regular rate and rhythm Lungs: wheezes bilaterally Abdomen: soft, non-tender; bowel sounds normal; no masses,  no organomegaly Wound: clean and dry  Lab Results: No results found for this basename: WBC, HGB, HCT, PLT,  in the last 72 hours BMET: No results found for this basename: NA, K, CL, CO2, GLUCOSE, BUN, CREATININE, CALCIUM,  in the last 72 hours  PT/INR: No results found for this basename: LABPROT, INR,  in the last 72 hours ABG    Component Value Date/Time   PHART 7.400 11/20/2012 0345   HCO3 23.9 11/20/2012 0345   TCO2 25.1 11/20/2012 0345   ACIDBASEDEF 0.3 11/20/2012 0345   O2SAT 97.6 11/20/2012 0345   CBG (last 3)   Recent Labs  11/23/12 1641 11/23/12 2121 11/24/12 0741  GLUCAP 93 85 113*    Assessment/Plan: S/P Procedure(s) (LRB): VIDEO ASSISTED THORACOSCOPY (VATS)/WEDGE RESECTION (Left) LOBECTOMY (Left) LYMPH NODE DISSECTION (Left)  1. Chest tube- air leak much improved, still present with cough, continue chest tube to suction for now 2. Pulm- + wheezing, congestion, continue nebulizer treatments, pulm toilet 3. CV- HTN much improved on Lopressor, Lisinopril, Norvasc 4. Dispo- Pathology pending, continue current care   LOS: 5 days     BARRETT, ERIN 11/24/2012  I don't see an air leak presently, just a lot of tidal variation Will place to water seal. Check chest x-ray at noon. If CXR OK and no air leak dc tube this afternoon Path still not in system

## 2012-11-25 ENCOUNTER — Inpatient Hospital Stay (HOSPITAL_COMMUNITY): Payer: Medicare Other

## 2012-11-25 LAB — GLUCOSE, CAPILLARY

## 2012-11-25 MED ORDER — RABEPRAZOLE SODIUM 20 MG PO TBEC: 20.0000 mg | DELAYED_RELEASE_TABLET | Freq: Every day | ORAL | Status: DC

## 2012-11-25 NOTE — Progress Notes (Signed)
6 Days Post-Op Procedure(s) (LRB): VIDEO ASSISTED THORACOSCOPY (VATS)/WEDGE RESECTION (Left) LOBECTOMY (Left) LYMPH NODE DISSECTION (Left) Subjective:  Ms. Cortner has no new complaints.  She continues to complain of cough.   Objective: Vital signs in last 24 hours: Temp:  [97.4 F (36.3 C)-98.8 F (37.1 C)] 98.2 F (36.8 C) (02/12 0700) Pulse Rate:  [57-88] 57 (02/12 0400) Cardiac Rhythm:  [-] Sinus bradycardia (02/12 0400) Resp:  [17-27] 17 (02/12 0400) BP: (108-121)/(49-60) 119/51 mmHg (02/12 0400) SpO2:  [91 %-95 %] 92 % (02/12 0400)  Intake/Output from previous day: 02/11 0701 - 02/12 0700 In: 620 [P.O.:600; I.V.:20] Out: 200 [Urine:200]  General appearance: alert, cooperative and no distress Neurologic: intact Heart: regular rate and rhythm Lungs: coarse Abdomen: soft, non-tender; bowel sounds normal; no masses,  no organomegaly Extremities: extremities normal, atraumatic, no cyanosis or edema Wound: clean and dry  Lab Results: No results found for this basename: WBC, HGB, HCT, PLT,  in the last 72 hours BMET: No results found for this basename: NA, K, CL, CO2, GLUCOSE, BUN, CREATININE, CALCIUM,  in the last 72 hours  PT/INR: No results found for this basename: LABPROT, INR,  in the last 72 hours ABG    Component Value Date/Time   PHART 7.400 11/20/2012 0345   HCO3 23.9 11/20/2012 0345   TCO2 25.1 11/20/2012 0345   ACIDBASEDEF 0.3 11/20/2012 0345   O2SAT 97.6 11/20/2012 0345   CBG (last 3)   Recent Labs  11/24/12 1647 11/24/12 2147 11/25/12 0735  GLUCAP 118* 140* 96    Assessment/Plan: S/P Procedure(s) (LRB): VIDEO ASSISTED THORACOSCOPY (VATS)/WEDGE RESECTION (Left) LOBECTOMY (Left) LYMPH NODE DISSECTION (Left)  1. CXR is stable in appearance, no significant change in pneumothorax from previous film 2. Pulm- will check patient's oxygen saturations if necessary we arrange home oxygen 3. CV- NSR, pressure controlled continue current regimen 4. Dispo- patient  doing well, will d/c home today   LOS: 6 days    Raford Pitcher, Denny Peon 11/25/2012

## 2012-11-25 NOTE — Progress Notes (Signed)
Pt ambulated in the hall 100 feet on room air. O2 sats ranged 89-92 percent. Pt tolerated well.

## 2012-11-25 NOTE — Progress Notes (Signed)
Discussed discharge instructions and medications with patient and husband. Both verbalized understanding with all questions answered. VSS. Pt discharged home with husband  Francies Inch,RN  

## 2012-12-04 ENCOUNTER — Other Ambulatory Visit: Payer: Self-pay | Admitting: *Deleted

## 2012-12-04 DIAGNOSIS — Z902 Acquired absence of lung [part of]: Secondary | ICD-10-CM

## 2012-12-08 ENCOUNTER — Ambulatory Visit
Admission: RE | Admit: 2012-12-08 | Discharge: 2012-12-08 | Disposition: A | Discharge status: Home / Self Care | Payer: Medicare Other | Source: Ambulatory Visit | Attending: Thoracic Surgery (Cardiothoracic Vascular Surgery) | Admitting: Thoracic Surgery (Cardiothoracic Vascular Surgery)

## 2012-12-08 ENCOUNTER — Encounter: Payer: Self-pay | Admitting: Thoracic Surgery (Cardiothoracic Vascular Surgery)

## 2012-12-08 ENCOUNTER — Other Ambulatory Visit: Payer: Self-pay | Admitting: *Deleted

## 2012-12-08 ENCOUNTER — Ambulatory Visit (INDEPENDENT_AMBULATORY_CARE_PROVIDER_SITE_OTHER): Payer: Self-pay | Admitting: Thoracic Surgery (Cardiothoracic Vascular Surgery)

## 2012-12-08 VITALS — BP 126/78 | HR 73 | Resp 18 | Ht 66.0 in | Wt 136.0 lb

## 2012-12-08 DIAGNOSIS — C3410 Malignant neoplasm of upper lobe, unspecified bronchus or lung: Secondary | ICD-10-CM

## 2012-12-08 DIAGNOSIS — C3490 Malignant neoplasm of unspecified part of unspecified bronchus or lung: Secondary | ICD-10-CM

## 2012-12-08 DIAGNOSIS — Z902 Acquired absence of lung [part of]: Secondary | ICD-10-CM

## 2012-12-08 DIAGNOSIS — Z09 Encounter for follow-up examination after completed treatment for conditions other than malignant neoplasm: Secondary | ICD-10-CM

## 2012-12-08 DIAGNOSIS — Z9889 Other specified postprocedural states: Secondary | ICD-10-CM

## 2012-12-08 NOTE — Progress Notes (Signed)
HPI: Mrs. is a 67 year old woman who underwent a left thoracoscopic upper lobectomy on February 6. She had what turned out to be a T1 B., in one, stage IIA neuroendocrine tumor. She had a complete resection. Postoperatively she did well without any major complications. She now returns for a scheduled postoperative followup visit.  She says that she is feeling well. She drove yesterday without problems. She has been vacuuming cleaning the house. She does still have some incisional discomfort. Oxycodone made her very nauseous. She saw Dr. Ricki Miller and he gave her a prescription for Xanax which seems to be helping her pain. She's not had any episodes of shortness of breath.  Past Medical History  Diagnosis Date  . GERD (gastroesophageal reflux disease)   . Asthma   . Hypertension   . Diabetes mellitus   . Thyroid disease   . Anxiety   . Vertigo   . Breast cancer   . COPD (chronic obstructive pulmonary disease)   . PONV (postoperative nausea and vomiting)       Current Outpatient Prescriptions  Medication Sig Dispense Refill  . albuterol (ACCUNEB) 1.25 MG/3ML nebulizer solution Take 1 ampule by nebulization 2 (two) times daily as needed. For shortness of breath      . albuterol (PROVENTIL HFA;VENTOLIN HFA) 108 (90 BASE) MCG/ACT inhaler Inhale 2 puffs into the lungs every 6 (six) hours as needed. For shortness of breath      . ALPRAZolam (XANAX) 0.25 MG tablet Take 0.25 mg by mouth 3 (three) times daily as needed.       Marland Kitchen amLODipine (NORVASC) 5 MG tablet Take 1 tablet (5 mg total) by mouth daily.  30 tablet  1  . aspirin EC 81 MG tablet Take 81 mg by mouth daily.      . Cholecalciferol (VITAMIN D) 2000 UNITS tablet Take 2,000 Units by mouth 2 (two) times daily.      . Fluticasone-Salmeterol (ADVAIR) 250-50 MCG/DOSE AEPB Inhale 1 puff into the lungs every 12 (twelve) hours.      . fosinopril (MONOPRIL) 20 MG tablet Take 20 mg by mouth daily.      Marland Kitchen levothyroxine (SYNTHROID, LEVOTHROID) 50 MCG  tablet Take 50 mcg by mouth daily.      . metoprolol tartrate (LOPRESSOR) 25 MG tablet Take 1 tablet (25 mg total) by mouth 2 (two) times daily.  60 tablet  1  . montelukast (SINGULAIR) 10 MG tablet Take 10 mg by mouth at bedtime.      . Probiotic Product (ALIGN PO) Take 1 capsule by mouth at bedtime.       . sucralfate (CARAFATE) 1 GM/10ML suspension Take 1 g by mouth daily.        No current facility-administered medications for this visit.    Physical Exam BP 126/78  Pulse 73  Resp 18  Ht 5\' 6"  (1.676 m)  Wt 136 lb (61.689 kg)  BMI 21.96 kg/m2  SpO2 98% Gen. 67 year old woman in no acute distress Lungs clear, diminished at left base Incisions healing well cardiac regular rate and rhythm  Diagnostic Tests: Chest x-ray 12/08/12 Shows postoperative changes  Impression: 67 year old woman status post thoracoscopic lobectomy on 11/19/12. She is doing extremely well postoperatively. She's been very active. She does have some incisional discomfort and is taking Xanax from time to time, but is not requiring narcotics. She is anxious to resume full activities.  At this point there are no restrictions on her activities but did advise her to build into new  activities slowly and gradually. She may dive but should avoid anything over 15 minutes for the next several weeks.  She did have a positive N1 node and has stage IIA disease. She would benefit from adjuvant chemotherapy. We will set her up an appointment to see Dr. Arbutus Ped to discuss adjuvant chemotherapy.  Plan: I will plan to see her back in 3 months with a PA and lateral chest x-ray at that time.  She knows to call if she has any issues in the meantime.

## 2012-12-09 ENCOUNTER — Telehealth: Payer: Self-pay | Admitting: Internal Medicine

## 2012-12-09 NOTE — Telephone Encounter (Signed)
PT scheduled per Annabelle Harman.  Welcome packet mailed.

## 2012-12-21 ENCOUNTER — Ambulatory Visit (HOSPITAL_BASED_OUTPATIENT_CLINIC_OR_DEPARTMENT_OTHER): Payer: Medicare Other

## 2012-12-21 ENCOUNTER — Encounter: Payer: Self-pay | Admitting: Internal Medicine

## 2012-12-21 ENCOUNTER — Telehealth: Payer: Self-pay | Admitting: Internal Medicine

## 2012-12-21 ENCOUNTER — Ambulatory Visit (HOSPITAL_BASED_OUTPATIENT_CLINIC_OR_DEPARTMENT_OTHER): Payer: Medicare Other | Admitting: Internal Medicine

## 2012-12-21 ENCOUNTER — Encounter: Payer: Self-pay | Admitting: *Deleted

## 2012-12-21 ENCOUNTER — Other Ambulatory Visit (HOSPITAL_BASED_OUTPATIENT_CLINIC_OR_DEPARTMENT_OTHER): Payer: Medicare Other | Admitting: Lab

## 2012-12-21 DIAGNOSIS — Z5111 Encounter for antineoplastic chemotherapy: Secondary | ICD-10-CM

## 2012-12-21 DIAGNOSIS — C7A1 Malignant poorly differentiated neuroendocrine tumors: Secondary | ICD-10-CM

## 2012-12-21 DIAGNOSIS — F17200 Nicotine dependence, unspecified, uncomplicated: Secondary | ICD-10-CM

## 2012-12-21 LAB — CBC WITH DIFFERENTIAL/PLATELET
Basophils Absolute: 0.1 10*3/uL (ref 0.0–0.1)
EOS%: 5.8 % (ref 0.0–7.0)
HCT: 40.6 % (ref 34.8–46.6)
HGB: 14.1 g/dL (ref 11.6–15.9)
LYMPH%: 14.7 % (ref 14.0–49.7)
MCH: 31.1 pg (ref 25.1–34.0)
MONO#: 0.7 10*3/uL (ref 0.1–0.9)
NEUT%: 69.6 % (ref 38.4–76.8)
Platelets: 178 10*3/uL (ref 145–400)
lymph#: 1.1 10*3/uL (ref 0.9–3.3)

## 2012-12-21 LAB — COMPREHENSIVE METABOLIC PANEL (CC13)
BUN: 9.4 mg/dL (ref 7.0–26.0)
CO2: 26 mEq/L (ref 22–29)
Calcium: 9.9 mg/dL (ref 8.4–10.4)
Chloride: 105 mEq/L (ref 98–107)
Creatinine: 0.7 mg/dL (ref 0.6–1.1)

## 2012-12-21 MED ORDER — CYANOCOBALAMIN 1000 MCG/ML IJ SOLN
1000.0000 ug | Freq: Once | INTRAMUSCULAR | Status: AC
  Administered 2012-12-21: 1000 ug via INTRAMUSCULAR

## 2012-12-21 MED ORDER — PROCHLORPERAZINE MALEATE 10 MG PO TABS: 10.0000 mg | ORAL_TABLET | Freq: Four times a day (QID) | ORAL | Status: DC | PRN

## 2012-12-21 MED ORDER — FOLIC ACID 1 MG PO TABS: 1.0000 mg | ORAL_TABLET | Freq: Every day | ORAL | Status: DC

## 2012-12-21 MED ORDER — DEXAMETHASONE 4 MG PO TABS: 4.0000 mg | ORAL_TABLET | Freq: Two times a day (BID) | ORAL | Status: DC

## 2012-12-21 NOTE — Progress Notes (Signed)
[facility] [phone]   [phone]  CONSULT NOTE  REFERRING PHYSICIAN: Dr. Charlett Lango  REASON FOR CONSULTATION:  67 years old white female diagnosed with lung cancer.  HPI DELANO SCARDINO is a 67 y.o. female was past medical history significant for hypertension, diabetes mellitus, asthma, COPD, GERD, anxiety, hypothyroidism, hearing deficit as well as history of left breast adenocarcinoma status post lumpectomy followed by adjuvant radiotherapy followed by 5 years of hormonal treatment with tamoxifen. She also has a long history of smoking. The patient mentions that in July of 2013 she presented to the emergency Department complaining of left arm pain as well as shortness of breath. She had CT angiogram of the chest performed on 04/13/2012 that showed scattered small pulmonary nodules concerning for bronchogenic carcinoma and followup CT scan of the chest was recommended in 6 months. On 11/03/2012 the patient had CT scan of the chest without contrast and it showed increased size of 1.3 CM peripheral pulmonary nodule in the lingula. Bronchogenic carcinoma cannot be excluded. There was also 4 mm satellite nodule within the lingula that remained stable and no evidence of lymphadenopathy or pleural effusion. The patient was referred to Dr. Dorris Fetch and a PET scan was performed on 11/06/2012 and showed the left lower lobe pulmonary nodule was hypermetabolic favoring malignancy. There was a new by 4 mm nodule and a small left adrenal adenoma. On 11/19/2012 the patient underwent left upper lobectomy with mediastinal lymph node dissection under the care of Dr. Dorris Fetch. 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 Seven lymph nodes were dissected with 2 positive level N1 lymph node for malignancy. The patient was referred to me today for further evaluation and recommendation  regarding adjuvant chemotherapy. She is recovering well from her surgery. She denied having any significant complaints today except for mild cough. She denied having any significant chest pain, shortness of breath or hemoptysis. The patient denied having any headache or visual changes. She intentionally lost a few pounds recently. Her family history significant for mother with ocular melanoma and sister with recurrent melanoma and another sister with thyroid cancer.  The patient is married and has 2 sons. She was accompanied today by her husband [place].  She has a history of smoking one pack per day for around 45 years and unfortunately she continues to smoke around half a pack per day. I strongly encouraged her to quit smoking and offered her to smoke cessation programs. She has no history of alcohol or drug abuse.  @SFHPI @  Past Medical History  Diagnosis Date  . GERD (gastroesophageal reflux disease)   . Asthma   . Hypertension   . Diabetes mellitus   . Thyroid disease   . Anxiety   . Vertigo   . Breast cancer   . COPD (chronic obstructive pulmonary disease)   . PONV (postoperative nausea and vomiting)     Past Surgical History  Procedure Laterality Date  . Cardiac catherization    . Breast lumpectomy    . Tubal ligation    . Video assisted thoracoscopy (vats)/wedge resection Left 11/19/2012    Procedure: VIDEO ASSISTED THORACOSCOPY (VATS)/WEDGE RESECTION;  Surgeon: Loreli Slot, MD;  Location: [facility] OR;  Service: Thoracic;  Laterality: Left;  left upper lobe wedge resection  . Lobectomy Left 11/19/2012    Procedure: LOBECTOMY;  Surgeon: Loreli Slot, MD;  Location: [facility] OR;  Service: Thoracic;  Laterality: Left;  left upper  lobe  . Lymph node dissection Left 11/19/2012    Procedure: LYMPH NODE DISSECTION;  Surgeon: Loreli Slot, MD;  Location: [facility] OR;  Service: Thoracic;  Laterality: Left;    Family History  Problem Relation Age of Onset  . Cancer Mother   .  COPD Mother   . Hypertension Mother   . COPD Father   . COPD Sister   . Cancer Sister   . COPD Brother   . Diabetes Brother   . Hyperlipidemia Brother   . Hypertension Brother   . Stroke Brother     Social History History  Substance Use Topics  . Smoking status: Current Every Day Smoker -- 1.00 packs/day for 50 years    Types: Cigarettes  . Smokeless tobacco: Not on file  . Alcohol Use: No    Allergies  Allergen Reactions  . Metformin And Related Nausea Only    Current Outpatient Prescriptions  Medication Sig Dispense Refill  . albuterol (PROVENTIL HFA;VENTOLIN HFA) 108 (90 BASE) MCG/ACT inhaler Inhale 2 puffs into the lungs every 6 (six) hours as needed. For shortness of breath      . ALPRAZolam (XANAX) 0.25 MG tablet Take 0.25 mg by mouth 3 (three) times daily as needed.       Marland Kitchen amLODipine (NORVASC) 5 MG tablet Take 1 tablet (5 mg total) by mouth daily.  30 tablet  1  . aspirin EC 81 MG tablet Take 81 mg by mouth daily.      . Cholecalciferol (VITAMIN D) 2000 UNITS tablet Take 2,000 Units by mouth 2 (two) times daily.      . Fluticasone-Salmeterol (ADVAIR) 250-50 MCG/DOSE AEPB Inhale 1 puff into the lungs every 12 (twelve) hours.      . fosinopril (MONOPRIL) 20 MG tablet Take 20 mg by mouth daily.      Marland Kitchen levothyroxine (SYNTHROID, LEVOTHROID) 50 MCG tablet Take 50 mcg by mouth daily.      . metoprolol tartrate (LOPRESSOR) 25 MG tablet Take 1 tablet (25 mg total) by mouth 2 (two) times daily.  60 tablet  1  . montelukast (SINGULAIR) 10 MG tablet Take 10 mg by mouth at bedtime.      . Probiotic Product (ALIGN PO) Take 1 capsule by mouth at bedtime.       . sucralfate (CARAFATE) 1 GM/10ML suspension Take 1 g by mouth daily.       Marland Kitchen albuterol (ACCUNEB) 1.25 MG/3ML nebulizer solution Take 1 ampule by nebulization 2 (two) times daily as needed. For shortness of breath      . oxyCODONE-acetaminophen (PERCOCET/ROXICET) 5-325 MG per tablet 1 tablet every 4 (four) hours as needed.  Take 1-2 tablets eery 4 hours prn      . RABEprazole (ACIPHEX) 20 MG tablet 1 tablet daily.       Current Facility-Administered Medications  Medication Dose Route Frequency Provider Last Rate Last Dose  . cyanocobalamin ((VITAMIN B-12)) injection 1,000 mcg  1,000 mcg Intramuscular Once Si Gaul, MD        Review of Systems  A comprehensive review of systems was negative except for: Respiratory: positive for cough  Physical Exam  ZOX:WRUEA, healthy, no distress, well nourished, well developed and anxious SKIN: skin color, texture, turgor are normal HEAD: Normocephalic, No masses, lesions, tenderness or abnormalities EYES: normal, PERRLA EARS: External ears normal OROPHARYNX:no exudate and no erythema  NECK: supple, no adenopathy LYMPH:  no palpable lymphadenopathy, no hepatosplenomegaly BREAST:not examined LUNGS: clear to auscultation  HEART: regular rate &  rhythm, no murmurs and no gallops ABDOMEN:abdomen soft, non-tender, normal bowel sounds and no masses or organomegaly BACK: Back symmetric, no curvature. EXTREMITIES:no joint deformities, effusion, or inflammation, no edema, no skin discoloration, no clubbing  NEURO: alert & oriented x 3 with fluent speech, no focal motor/sensory deficits  PERFORMANCE STATUS: ECOG 1  LABORATORY DATA: Lab Results  Component Value Date   WBC 7.4 12/21/2012   HGB 14.1 12/21/2012   HCT 40.6 12/21/2012   MCV 89.9 12/21/2012   PLT 178 12/21/2012      Chemistry      Component Value Date/Time   NA 140 12/21/2012 0952   NA 140 11/21/2012 0445   K 3.4* 12/21/2012 0952   K 3.8 11/21/2012 0445   CL 105 12/21/2012 0952   CL 105 11/21/2012 0445   CO2 26 12/21/2012 0952   CO2 27 11/21/2012 0445   BUN 9.4 12/21/2012 0952   BUN 6 11/21/2012 0445   CREATININE 0.7 12/21/2012 0952   CREATININE 0.55 11/21/2012 0445      Component Value Date/Time   CALCIUM 9.9 12/21/2012 0952   CALCIUM 9.3 11/21/2012 0445   ALKPHOS 222* 12/21/2012 0952   ALKPHOS 65 11/21/2012  0445   AST 78* 12/21/2012 0952   AST 15 11/21/2012 0445   ALT 221* 12/21/2012 0952   ALT 16 11/21/2012 0445   BILITOT 0.89 12/21/2012 0952   BILITOT 0.4 11/21/2012 0445       RADIOGRAPHIC STUDIES: Dg Chest 2 View  12/08/2012  *RADIOLOGY REPORT*  Clinical Data: Left upper lobectomy for resection of non-small cell lung carcinoma, follow-up  CHEST - 2 VIEW  Comparison: Chest x-ray of 11/25/2012 and CT chest of 11/03/2012  Findings: Postoperative changes are stable on the left with volume loss and elevation of the left hemidiaphragm.  The previously noted right middle lobe atelectasis has resolved.  No pleural effusion is seen.  Heart size is stable.  IMPRESSION: Stable postoperative change on the left.  Resolution of right middle lobe atelectasis.   Original Report Authenticated By: Dwyane Dee, M.D.    Dg Chest 2 View  11/25/2012  *RADIOLOGY REPORT*  Clinical Data: Shortness of breath, chest tube removal.  CHEST - 2 VIEW  Comparison: 11/24/2012.  Findings: Trachea is midline.  Heart size normal.  Postoperative changes on the left hemithorax with associated volume loss.  Small left apical pneumothorax has increased in size after left chest tube removal.  Right basilar atelectasis.  Right IJ central line tip projects over the SVC.  There may be a tiny left pleural effusion.  Subcutaneous emphysema along the lower left lateral chest wall is again noted.  IMPRESSION:  1.  Small left apical pneumothorax, slightly increased in size after left chest tube removal. 2.  Postoperative changes and volume loss in the left hemithorax. 3.  Right basilar atelectasis.   Original Report Authenticated By: Leanna Battles, M.D.    Dg Chest Port 1 View  11/24/2012  *RADIOLOGY REPORT*  Clinical Data: Left chest tube to water seal.  PORTABLE CHEST - 1 VIEW  Comparison: 11/24/2012 6:32 am  Findings: Left-sided chest tube is in place.  The apical aspect of left pneumothorax is minimally larger than on the prior exam (5%). Inferior  aspect of left-sided pneumothorax is stable.  Postsurgical changes left chest.  Right central line tip distal superior vena cava/caval atrial junction.  Mild mediastinal shift to the left.  IMPRESSION: Left-sided chest tube is in place.  The apical aspect of left pneumothorax is minimally  larger than on the prior exam (5%). Inferior aspect of left-sided pneumothorax is stable.  This is a call report.   Original Report Authenticated By: Lacy Duverney, M.D.    Dg Chest Port 1 View  11/24/2012  *RADIOLOGY REPORT*  Clinical Data: Evaluation of left chest tube.  PORTABLE CHEST - 1 VIEW  Comparison: 11/23/2012.  Findings: The left chest tube remains in place with its tip in the upper left hemithorax. There is a tiny left apical pneumothorax. Tip of right internal jugular venous catheter terminates in the cavoatrial junction region.  No right pneumothorax is evident. Cardiac silhouette upper range normal size.  There is slight elevation of the margin of the left hemidiaphragm. There is some overall volume loss in the left lung.  There is very minimal basilar atelectasis.  No pleural effusion is evident.  No skeletal lesion is seen.  Nonaneurysmal aortic calcifications are present.  IMPRESSION: The left chest tube remains in place with its tip in the upper left hemithorax. There is a tiny left apical pneumothorax without significant change.  Right internal jugular venous catheter remains in place.  Volume loss of left lung.  Minimal basilar atelectasis.   Original Report Authenticated By: Onalee Hua Call    Dg Chest Port 1 View  11/23/2012  *RADIOLOGY REPORT*  Clinical Data: Pneumothorax.  PORTABLE CHEST - 1 VIEW  Comparison: 11/22/2012 and CT chest 11/03/2012.  Findings: Trachea is midline.  Heart size normal.  Thoracic aorta is calcified.  Right IJ central line tip projects over the SVC. Left chest tube terminates at the apex of the left hemithorax. Suspect small residual left apical pneumothorax, superimposed with the  left 3rd posterior rib.  Mild bibasilar air space disease, unchanged.  Postoperative changes and volume loss in the left hemithorax.  There may be a tiny right pleural effusion.  Small amount of subcutaneous emphysema is seen along the lower left lateral chest wall.  Surgical clips project over the left upper quadrant.  IMPRESSION:  1.  Probable residual small left apical pneumothorax with left chest tube in place. 2.  Postoperative changes in the left hemithorax, stable. 3.  Minimal bibasilar atelectasis and probable tiny right pleural effusion.   Original Report Authenticated By: Leanna Battles, M.D.    Dg Chest Port 1 View  11/22/2012  *RADIOLOGY REPORT*  Clinical Data: Left apical pneumothorax  PORTABLE CHEST - 1 VIEW  Comparison: Prior chest x-ray 11/21/2012  Findings: Stable position of left thoracostomy tubes with decreasing left pneumothorax.  There is a small amount of residual apical and subpulmonic pneumothorax on the left.  However, the lateral pleural space again touches the thoracic wall.  Persistent mild bibasilar subsegmental atelectasis.  Unchanged position of right IJ approach central venous catheter with the tip projecting over the superior cavoatrial junction.  Dense aortic atherosclerosis is stable.  IMPRESSION:  1. Persistent but improved left pneumothorax 2.  Persistent mild bibasilar atelectasis 3.  Stable and satisfactory support apparatus   Original Report Authenticated By: Malachy Moan, M.D.     ASSESSMENT: This is a very pleasant 67 years old white female recently diagnosed with a stage IIA (T2 A., N1, M0) non-small cell lung cancer consistent with high-grade poorly differentiated neuroendocrine carcinoma, large cell type. She status post left upper lobectomy with lymph node dissection.  PLAN: I have a lengthy discussion with the patient and her husband today about her disease stage prognosis, and treatment options. I recommended for the patient adjuvant systemic chemotherapy  with platinum-based regimen in the  form of cisplatin 75 mg/M2 and Alimta 500 mg/M2 every 3 weeks for a total of 4 cycles. I discussed with the patient adverse effect of this treatment including but not limited to alopecia, suppression, nausea vomiting, peripheral neuropathy, liver or renal dysfunction as well as hearing deficit. I explained to the patient that the overall absolute 5 year survival for adjuvant chemotherapy in her case is in the range of 6-10%.  The patient would like to proceed with treatment as planned. She will receive vitamin B12 injection today. I will call her pharmacy with Compazine 10 mg by mouth every 6 hours as needed for nausea, Decadron 4 mg by mouth twice a day the day before, day of and day after the chemotherapy as well as folic acid 1 mg by mouth daily. I will arrange for the patient to have chemotherapy education class before starting the first cycle of her chemotherapy She is expected to start the first cycle of this treatment next week. She would come back for followup visit in 2 weeks for reevaluation and management any adverse effect of her chemotherapy. The patient was advised to call immediately if she has any concerning symptoms in the interval. I gave the patient and her husband the time to ask questions and I answered them completely to their satisfaction. For smoke cessation, I strongly advise the patient to quit smoking and she was seen by the thoracic oncology nurse navigator who provided her with handouts about smoke cessation.  All questions were answered. The patient knows to call the clinic with any problems, questions or concerns. We can certainly see the patient much sooner if necessary.  Thank you so much for allowing me to participate in the care of SENECA GADBOIS. I will continue to follow up the patient with you and assist in her care.  I spent 30 minutes counseling the patient face to face. The total time spent in the appointment was 60  minutes.  MOHAMED,MOHAMED K. 12/21/2012, 11:08 AM

## 2012-12-21 NOTE — Patient Instructions (Addendum)
[facility] Discharge Instructions for Patients Receiving Vit B12  Today you received the following agent: Vit B12 injection  If you develop nausea and vomiting that is not controlled by your nausea medication, call the clinic. If it is after clinic hours your family physician or the after hours number for the clinic or go to the Emergency Department.   BELOW ARE SYMPTOMS THAT SHOULD BE REPORTED IMMEDIATELY:  *FEVER GREATER THAN 100.5 F  *CHILLS WITH OR WITHOUT FEVER  NAUSEA AND VOMITING THAT IS NOT CONTROLLED WITH YOUR NAUSEA MEDICATION  *UNUSUAL SHORTNESS OF BREATH  *UNUSUAL BRUISING OR BLEEDING  TENDERNESS IN MOUTH AND THROAT WITH OR WITHOUT PRESENCE OF ULCERS  *URINARY PROBLEMS  *BOWEL PROBLEMS  UNUSUAL RASH Items with * indicate a potential emergency and should be followed up as soon as possible.   Please let the nurse know about any problems that you may have experienced. Feel free to call the clinic you have any questions or concerns. The clinic phone number is [phone].   I have been informed and understand all the instructions given to me. I know to contact the clinic, my physician, or go to the Emergency Department if any problems should occur. I do not have any questions at this time, but understand that I may call the clinic during office hours   should I have any questions or need assistance in obtaining follow up care.    __________________________________________  _____________  __________ Signature of Patient or Authorized Representative            Date                   Time    __________________________________________ Nurse's Signature  Cyanocobalamin, Vitamin B12 injection What is this medicine? CYANOCOBALAMIN (sye an oh koe BAL a min) is a man made form of vitamin B12. Vitamin B12 is used in the growth of healthy blood cells, nerve cells, and proteins in the body. It also helps with the metabolism of fats and carbohydrates. This  medicine is used to treat people who can not absorb vitamin B12. This medicine may be used for other purposes; ask your health care provider or pharmacist if you have questions. What should I tell my health care provider before I take this medicine? They need to know if you have any of these conditions: -kidney disease -Leber's disease -megaloblastic anemia -an unusual or allergic reaction to cyanocobalamin, cobalt, other medicines, foods, dyes, or preservatives -pregnant or trying to get pregnant -breast-feeding How should I use this medicine? This medicine is injected into a muscle or deeply under the skin. It is usually given by a health care professional in a clinic or doctor's office. However, your doctor may teach you how to inject yourself. Follow all instructions. Talk to your pediatrician regarding the use of this medicine in children. Special care may be needed. Overdosage: If you think you have taken too much of this medicine contact a poison control center or emergency room at once. NOTE: This medicine is only for you. Do not share this medicine with others. What if I miss a dose? If you are given your dose at a clinic or doctor's office, call to reschedule your appointment. If you give your own injections and you miss a dose, take it as soon as you can. If it is almost time for your next dose, take only that dose. Do not take double or extra doses. What may interact with this medicine? -colchicine -  heavy alcohol intake This list may not describe all possible interactions. Give your health care provider a list of all the medicines, herbs, non-prescription drugs, or dietary supplements you use. Also tell them if you smoke, drink alcohol, or use illegal drugs. Some items may interact with your medicine. What should I watch for while using this medicine? Visit your doctor or health care professional regularly. You may need blood work done while you are taking this medicine. You may  need to follow a special diet. Talk to your doctor. Limit your alcohol intake and avoid smoking to get the best benefit. What side effects may I notice from receiving this medicine? Side effects that you should report to your doctor or health care professional as soon as possible: -allergic reactions like skin rash, itching or hives, swelling of the face, lips, or tongue -blue tint to skin -chest tightness, pain -difficulty breathing, wheezing -dizziness -red, swollen painful area on the leg Side effects that usually do not require medical attention (report to your doctor or health care professional if they continue or are bothersome): -diarrhea -headache This list may not describe all possible side effects. Call your doctor for medical advice about side effects. You may report side effects to FDA at 1-800-FDA-1088. Where should I keep my medicine? Keep out of the reach of children. Store at room temperature between 15 and 30 degrees C (59 and 85 degrees F). Protect from light. Throw away any unused medicine after the expiration date. NOTE: This sheet is a summary. It may not cover all possible information. If you have questions about this medicine, talk to your doctor, pharmacist, or health care provider.  2013, [employer]. (01/11/2008 10:10:20 PM)

## 2012-12-21 NOTE — Telephone Encounter (Signed)
gv and printed appt schedule for pt for march...printed chemo for Melissa to add tx.Marland KitchenMarland KitchenMarland Kitchen

## 2012-12-21 NOTE — Patient Instructions (Signed)
You are recently diagnosed with a stage II a non-small cell lung cancer status post left upper lobectomy. We discussed adjuvant chemotherapy and I recommended for her to treatment with cisplatin and Alimta for 4 cycles. Your agreed to the current plan and the expected to start the first cycle of this treatment next week.

## 2012-12-21 NOTE — Progress Notes (Signed)
Checked in new patient. No financial issues. °

## 2012-12-21 NOTE — Progress Notes (Signed)
Spoke with pt at [facility] today.  Gave and explained information on smoking cessation.  Also, gave her information on chemo

## 2012-12-23 ENCOUNTER — Other Ambulatory Visit: Payer: Medicare Other

## 2012-12-23 ENCOUNTER — Encounter: Payer: Self-pay | Admitting: *Deleted

## 2012-12-24 ENCOUNTER — Encounter: Payer: Self-pay | Admitting: Specialist

## 2012-12-24 NOTE — Progress Notes (Signed)
I called Ms. Pultz at the request of Nils Flack, chemotherapy education nurse.  I was unable to attend the chemotherapy class yesterday, and Alleta observed that Ms. Roads might benefit from support.  I talked briefly with Ms. Pallo and gave her information about support services and programs.  I also encouraged her to call me and the Support Center team if we could help in any way.

## 2012-12-25 ENCOUNTER — Telehealth: Payer: Self-pay | Admitting: *Deleted

## 2012-12-25 NOTE — Telephone Encounter (Signed)
Pt called and is scheduled to get her 1st cycle of chemo 3/18 and has her MRI at [address] scheduled 3/19. She says she is claustrophobic. She has xanax that she takes PRN but wants the open MRI machine. I called [facility] and they cannot do an open MRI based on the type of MRI that has been ordered.  Sent msg to Dr Donnald Garre about whether it is okay for pt to have an open MRI scheduled.  SLJ

## 2012-12-27 ENCOUNTER — Other Ambulatory Visit: Payer: Medicare Other

## 2012-12-29 ENCOUNTER — Other Ambulatory Visit (HOSPITAL_BASED_OUTPATIENT_CLINIC_OR_DEPARTMENT_OTHER): Payer: Medicare Other | Admitting: Lab

## 2012-12-29 ENCOUNTER — Ambulatory Visit (HOSPITAL_BASED_OUTPATIENT_CLINIC_OR_DEPARTMENT_OTHER): Payer: Medicare Other

## 2012-12-29 ENCOUNTER — Telehealth: Payer: Self-pay | Admitting: *Deleted

## 2012-12-29 DIAGNOSIS — C7A1 Malignant poorly differentiated neuroendocrine tumors: Secondary | ICD-10-CM

## 2012-12-29 DIAGNOSIS — Z5111 Encounter for antineoplastic chemotherapy: Secondary | ICD-10-CM

## 2012-12-29 LAB — COMPREHENSIVE METABOLIC PANEL (CC13)
Alkaline Phosphatase: 164 U/L — ABNORMAL HIGH (ref 40–150)
BUN: 13.3 mg/dL (ref 7.0–26.0)
CO2: 23 mEq/L (ref 22–29)
Creatinine: 0.7 mg/dL (ref 0.6–1.1)
Glucose: 142 mg/dl — ABNORMAL HIGH (ref 70–99)
Sodium: 138 mEq/L (ref 136–145)
Total Bilirubin: 0.53 mg/dL (ref 0.20–1.20)

## 2012-12-29 LAB — CBC WITH DIFFERENTIAL/PLATELET
BASO%: 0.1 % (ref 0.0–2.0)
EOS%: 0.1 % (ref 0.0–7.0)
HCT: 39.8 % (ref 34.8–46.6)
LYMPH%: 5.5 % — ABNORMAL LOW (ref 14.0–49.7)
MCH: 30.8 pg (ref 25.1–34.0)
MCHC: 34.2 g/dL (ref 31.5–36.0)
MCV: 90 fL (ref 79.5–101.0)
MONO%: 5.1 % (ref 0.0–14.0)
NEUT%: 89.2 % — ABNORMAL HIGH (ref 38.4–76.8)
lymph#: 0.8 10*3/uL — ABNORMAL LOW (ref 0.9–3.3)

## 2012-12-29 MED ORDER — SODIUM CHLORIDE 0.9 % IV SOLN
Freq: Once | INTRAVENOUS | Status: AC
  Administered 2012-12-29: 10:00:00 via INTRAVENOUS

## 2012-12-29 MED ORDER — DEXAMETHASONE SODIUM PHOSPHATE 4 MG/ML IJ SOLN
12.0000 mg | Freq: Once | INTRAMUSCULAR | Status: AC
  Administered 2012-12-29: 12 mg via INTRAVENOUS

## 2012-12-29 MED ORDER — SODIUM CHLORIDE 0.9 % IV SOLN
500.0000 mg/m2 | Freq: Once | INTRAVENOUS | Status: AC
  Administered 2012-12-29: 850 mg via INTRAVENOUS
  Filled 2012-12-29: qty 34

## 2012-12-29 MED ORDER — PALONOSETRON HCL INJECTION 0.25 MG/5ML
0.2500 mg | Freq: Once | INTRAVENOUS | Status: AC
  Administered 2012-12-29: 0.25 mg via INTRAVENOUS

## 2012-12-29 MED ORDER — POTASSIUM CHLORIDE 2 MEQ/ML IV SOLN
Freq: Once | INTRAVENOUS | Status: AC
  Administered 2012-12-29: 10:00:00 via INTRAVENOUS
  Filled 2012-12-29: qty 10

## 2012-12-29 MED ORDER — SODIUM CHLORIDE 0.9 % IV SOLN
150.0000 mg | Freq: Once | INTRAVENOUS | Status: AC
  Administered 2012-12-29: 150 mg via INTRAVENOUS
  Filled 2012-12-29: qty 5

## 2012-12-29 MED ORDER — SODIUM CHLORIDE 0.9 % IV SOLN
75.0000 mg/m2 | Freq: Once | INTRAVENOUS | Status: AC
  Administered 2012-12-29: 127 mg via INTRAVENOUS
  Filled 2012-12-29: qty 127

## 2012-12-29 NOTE — Telephone Encounter (Signed)
Called MRI dept, had MRI r/s to [address] to Sunday 12/30/12 at 5:30pm, pt is to arrive at 5pm.  Appt was available for 3/20 but pt requested to not have MRI so soon after her chemo appt today.  Pt is aware that if test is positive she will have to have another MRI at [facility] and it is not open.  Pt verbalized understanding.  She will also have driver take her to the MRI since she will take her xanax.  SLJ

## 2012-12-29 NOTE — Progress Notes (Signed)
Pt voided  950cc  Pre CDDP ;   Voided  1450cc  Post  CDDP.

## 2012-12-29 NOTE — Telephone Encounter (Signed)
Message copied by Caren Griffins on Tue Dec 29, 2012  8:59 AM ------      Message from: Si Gaul      Created: Sat Dec 26, 2012  8:24 AM      Regarding: RE: MRI        Ok to change to open MRI but if there is any brain lesions, she may have to do the other SRS one too.      ----- Message -----         From: Marlan Palau, RN         Sent: 12/25/2012   3:52 PM           To: Si Gaul, MD, Charma Igo, RN      Subject: MRI                                                      Hello,            This is a new pt that is scheduled to get her 1st cycle of chemo 3/18 and has her MRI at [address] [facility] [address] scheduled 3/19.  She says she is claustrophobic.  She has xanax that she takes PRN but wants the open MRI machine.  I called [facility] and they cannot do an open MRI based on the type of MRI that has been ordered [facility]?).              Would it be okay to change her to get an open MRI?         ------

## 2012-12-29 NOTE — Patient Instructions (Signed)
[facility] Discharge Instructions for Patients Receiving Chemotherapy  Today you received the following chemotherapy agents :  Cisplatin,  Alimta.  To help prevent nausea and vomiting after your treatment, we encourage you to take your nausea medication as instructed by your physician.    If you develop nausea and vomiting that is not controlled by your nausea medication, call the clinic. If it is after clinic hours your family physician or the after hours number for the clinic or go to the Emergency Department.   BELOW ARE SYMPTOMS THAT SHOULD BE REPORTED IMMEDIATELY:  *FEVER GREATER THAN 100.5 F  *CHILLS WITH OR WITHOUT FEVER  NAUSEA AND VOMITING THAT IS NOT CONTROLLED WITH YOUR NAUSEA MEDICATION  *UNUSUAL SHORTNESS OF BREATH  *UNUSUAL BRUISING OR BLEEDING  TENDERNESS IN MOUTH AND THROAT WITH OR WITHOUT PRESENCE OF ULCERS  *URINARY PROBLEMS  *BOWEL PROBLEMS  UNUSUAL RASH Items with * indicate a potential emergency and should be followed up as soon as possible.  One of the nurses will contact you 24 hours after your treatment. Please let the nurse know about any problems that you may have experienced. Feel free to call the clinic you have any questions or concerns. The clinic phone number is [phone].   I have been informed and understand all the instructions given to me. I know to contact the clinic, my physician, or go to the Emergency Department if any problems should occur. I do not have any questions at this time, but understand that I may call the clinic during office hours   should I have any questions or need assistance in obtaining follow up care.    __________________________________________  _____________  __________ Signature of Patient or Authorized Representative            Date                   Time    __________________________________________ Nurse's Signature

## 2012-12-30 ENCOUNTER — Other Ambulatory Visit: Payer: Medicare Other

## 2012-12-30 ENCOUNTER — Telehealth: Payer: Self-pay | Admitting: *Deleted

## 2012-12-30 NOTE — Telephone Encounter (Signed)
Message copied by Augusto Garbe on Wed Dec 30, 2012 11:17 AM ------      Message from: Normajean Baxter LE      Created: Tue Dec 29, 2012  1:57 PM      Regarding: Chemo f/u call       First time  Cisplatin, Alimta,  Dr. Arbutus Ped,  TB, RN ------

## 2012-12-30 NOTE — Telephone Encounter (Signed)
Patient is doing well.  Has felt tired and slept more this morning.  Eating and drinking well.  No trouble with urination.  Has the patient education information but says she has not gone over it yet.  "Somethings I just don't want to know."  Reviewed side effects of Cisplatin and alimta asking that she keep the drug handouts handy for easy reference.  No questions at this time.  Will call if needed.

## 2013-01-03 ENCOUNTER — Other Ambulatory Visit: Payer: Medicare Other

## 2013-01-05 ENCOUNTER — Other Ambulatory Visit (HOSPITAL_BASED_OUTPATIENT_CLINIC_OR_DEPARTMENT_OTHER): Payer: Medicare Other | Admitting: Lab

## 2013-01-05 ENCOUNTER — Encounter: Payer: Self-pay | Admitting: Physician Assistant

## 2013-01-05 ENCOUNTER — Ambulatory Visit (HOSPITAL_BASED_OUTPATIENT_CLINIC_OR_DEPARTMENT_OTHER): Payer: Medicare Other | Admitting: Physician Assistant

## 2013-01-05 ENCOUNTER — Telehealth: Payer: Self-pay | Admitting: Internal Medicine

## 2013-01-05 DIAGNOSIS — C7A1 Malignant poorly differentiated neuroendocrine tumors: Secondary | ICD-10-CM

## 2013-01-05 LAB — CBC WITH DIFFERENTIAL/PLATELET
Basophils Absolute: 0.1 10*3/uL (ref 0.0–0.1)
EOS%: 2.3 % (ref 0.0–7.0)
HCT: 42.8 % (ref 34.8–46.6)
HGB: 14.7 g/dL (ref 11.6–15.9)
MCH: 30.7 pg (ref 25.1–34.0)
MCV: 89.4 fL (ref 79.5–101.0)
MONO%: 7.6 % (ref 0.0–14.0)
NEUT%: 65.8 % (ref 38.4–76.8)
lymph#: 1.9 10*3/uL (ref 0.9–3.3)

## 2013-01-05 LAB — COMPREHENSIVE METABOLIC PANEL (CC13)
Albumin: 3.1 g/dL — ABNORMAL LOW (ref 3.5–5.0)
Alkaline Phosphatase: 115 U/L (ref 40–150)
BUN: 15.1 mg/dL (ref 7.0–26.0)
CO2: 26 mEq/L (ref 22–29)
Glucose: 104 mg/dl — ABNORMAL HIGH (ref 70–99)
Potassium: 3.6 mEq/L (ref 3.5–5.1)

## 2013-01-05 NOTE — Telephone Encounter (Signed)
Gave pt appt for MRI, lab , ML and chemo for April and May 2014 °

## 2013-01-05 NOTE — Patient Instructions (Addendum)
Continue weekly labs as scheduled Take your antinausea medications as prescribed Followup in 2 weeks prior to your next scheduled cycle of adjuvant chemotherapy

## 2013-01-07 ENCOUNTER — Encounter: Payer: Self-pay | Admitting: Internal Medicine

## 2013-01-07 NOTE — Progress Notes (Signed)
[employer] approved ondansetron ODT 8mg  from 01/06/13-01/06/14.

## 2013-01-07 NOTE — Progress Notes (Signed)
No images are attached to the encounter. No scans are attached to the encounter. No scans are attached to the encounter. [facility] OFFICE PROGRESS NOTE  MILLER, [name], MD 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  INTERVAL HISTORY: Sara Mcclure 67 y.o. female returns for a scheduled regular symptom management visit for followup of her non-small cell lung cancer, poorly differentiated neuroendocrine carcinoma now status post one cycle of adjuvant chemotherapy with cisplatin and Alimta. She had severe nausea and vomiting uncontrolled with her Compazine. She did call the physician on call, Dr. beer and 7, who called in a prescription for Compazine suppositories and Zofran 8 mg ODT which worked very well for her. She did have some problems with the Zofran coverage with her insurance. She also has some issues with constipation but she missed that she had "slacked off" with her MiraLAX usage.   MEDICAL HISTORY: Past Medical History  Diagnosis Date  . GERD (gastroesophageal reflux disease)   . Asthma   . Hypertension   . Diabetes mellitus   . Thyroid disease   . Anxiety   . Vertigo   . Breast cancer   . COPD (chronic obstructive pulmonary disease)   . PONV (postoperative nausea and vomiting)     ALLERGIES:  is allergic to metformin and related.  MEDICATIONS:  Current Outpatient Prescriptions  Medication Sig Dispense Refill  . ondansetron (ZOFRAN-ODT) 8 MG disintegrating tablet Take 8 mg by mouth every 8 (eight) hours as needed for nausea.       . prochlorperazine (COMPAZINE) 25 MG suppository Place 25 mg rectally every 12 (twelve) hours as needed for nausea.      Marland Kitchen albuterol (ACCUNEB) 1.25 MG/3ML nebulizer solution Take 1 ampule by nebulization 2 (two) times daily as needed. For shortness of breath      . albuterol (PROVENTIL HFA;VENTOLIN HFA) 108 (90 BASE) MCG/ACT inhaler Inhale 2 puffs into the lungs every 6 (six) hours as needed. For shortness of breath      . ALPRAZolam (XANAX) 0.25 MG tablet Take 0.25 mg by mouth 3 (three) times daily as needed.       Marland Kitchen amLODipine (NORVASC) 5 MG tablet Take 1 tablet (5 mg total) by mouth daily.  30 tablet  1  . aspirin EC 81 MG tablet Take 81 mg by mouth daily.      . Cholecalciferol (VITAMIN D) 2000 UNITS tablet Take 2,000 Units by mouth 2 (two) times daily.      Marland Kitchen dexamethasone (DECADRON) 4 MG tablet Take 1 tablet (4 mg total) by mouth 2 (two) times daily with a meal.  40 tablet  0  . Fluticasone-Salmeterol (ADVAIR) 250-50 MCG/DOSE AEPB Inhale 1 puff into the lungs every 12 (twelve) hours.      . folic acid (FOLVITE) 1 MG tablet Take 1 tablet (1 mg total) by mouth daily.  30 tablet  3  . fosinopril (MONOPRIL) 20 MG tablet Take 20 mg by mouth daily.      Marland Kitchen levothyroxine (SYNTHROID, LEVOTHROID) 50 MCG tablet Take 50 mcg by mouth daily.      Marland Kitchen  metoprolol tartrate (LOPRESSOR) 25 MG tablet Take 1 tablet (25 mg total) by mouth 2 (two) times daily.  60 tablet  1  . montelukast (SINGULAIR) 10 MG tablet Take 10 mg by mouth at bedtime.      Marland Kitchen oxyCODONE-acetaminophen (PERCOCET/ROXICET) 5-325 MG per tablet 1 tablet every 4 (four) hours as needed. Take 1-2 tablets eery 4 hours prn      . Probiotic Product (ALIGN PO) Take 1 capsule by mouth at bedtime.       . prochlorperazine (COMPAZINE) 10 MG tablet Take 1 tablet (10 mg total) by mouth every 6 (six) hours as needed.  60 tablet  0  . RABEprazole (ACIPHEX) 20 MG tablet 1 tablet daily.      . sucralfate (CARAFATE) 1 GM/10ML suspension Take 1 g by mouth daily.         No current facility-administered medications for this visit.    SURGICAL HISTORY:  Past Surgical History  Procedure Laterality Date  . Cardiac catherization    . Breast lumpectomy    . Tubal ligation    . Video assisted thoracoscopy (vats)/wedge resection Left 11/19/2012    Procedure: VIDEO ASSISTED THORACOSCOPY (VATS)/WEDGE RESECTION;  Surgeon: Loreli Slot, MD;  Location: [facility] OR;  Service: Thoracic;  Laterality: Left;  left upper lobe wedge resection  . Lobectomy Left 11/19/2012    Procedure: LOBECTOMY;  Surgeon: Loreli Slot, MD;  Location: [facility] OR;  Service: Thoracic;  Laterality: Left;  left upper lobe  . Lymph node dissection Left 11/19/2012    Procedure: LYMPH NODE DISSECTION;  Surgeon: Loreli Slot, MD;  Location: [facility] OR;  Service: Thoracic;  Laterality: Left;    REVIEW OF SYSTEMS:  A comprehensive review of systems was negative except for: Gastrointestinal: positive for constipation, nausea and vomiting   PHYSICAL EXAMINATION: General appearance: alert, cooperative, appears stated age and no distress Head: Normocephalic, without obvious abnormality, atraumatic Neck: no adenopathy, no carotid bruit, no JVD, supple, symmetrical, trachea midline and thyroid not enlarged, symmetric, no tenderness/mass/nodules Lymph nodes: Cervical, supraclavicular, and axillary nodes normal. Resp: clear to auscultation bilaterally Cardio: regular rate and rhythm, S1, S2 normal, no murmur, click, rub or gallop GI: soft, non-tender; bowel sounds normal; no masses,  no organomegaly Extremities: extremities normal, atraumatic, no cyanosis or edema Neurologic: Alert and oriented X 3, normal strength and tone. Normal symmetric reflexes. Normal coordination and gait  ECOG PERFORMANCE STATUS: 1 - Symptomatic but completely ambulatory  Blood pressure 114/64, pulse 78, temperature 98.6 F (37 C), temperature source Oral, resp. rate 18, height 5\' 6"  (1.676 m), weight 129 lb 12.8 oz  (58.877 kg).  LABORATORY DATA: Lab Results  Component Value Date   WBC 8.3 01/05/2013   HGB 14.7 01/05/2013   HCT 42.8 01/05/2013   MCV 89.4 01/05/2013   PLT 199 01/05/2013      Chemistry      Component Value Date/Time   NA 134* 01/05/2013 0834   NA 140 11/21/2012 0445   K 3.6 01/05/2013 0834   K 3.8 11/21/2012 0445   CL 99 01/05/2013 0834   CL 105 11/21/2012 0445   CO2 26 01/05/2013 0834   CO2 27 11/21/2012 0445   BUN 15.1 01/05/2013 0834   BUN 6 11/21/2012 0445   CREATININE 0.7 01/05/2013 0834   CREATININE 0.55 11/21/2012 0445      Component Value Date/Time   CALCIUM 9.6 01/05/2013 0834   CALCIUM 9.3 11/21/2012 0445   ALKPHOS 115 01/05/2013 0834   ALKPHOS 65  11/21/2012 0445   AST 24 01/05/2013 0834   AST 15 11/21/2012 0445   ALT 42 01/05/2013 0834   ALT 16 11/21/2012 0445   BILITOT 0.51 01/05/2013 0834   BILITOT 0.4 11/21/2012 0445       RADIOGRAPHIC STUDIES:  No results found.   ASSESSMENT/PLAN: Patient is a very pleasant 67 year old white female recently diagnosed with stage IIA non-small cell lung cancer (T2 A., N1, M0) consistent with high-grade poorly differentiated your endocrine carcinoma, large cell type. She is status post left upper lobectomy with lymph node dissection. She's currently receiving adjuvant chemotherapy in the form of cisplatin 75 mg per meter squared and Alimta 500 mg region squared given every 3 weeks for a total of 4 planned cycles. She is now status post one cycle. She did have some significant problems with nausea and vomiting and we will work on a prior authorization for Zofran since this is more efficacious for her symptomatology. She'll continue with weekly labs consisting of a CBC differential, C. met and magnesium. She'll return in 2 weeks prior to her next scheduled cycle of adjuvant chemotherapy with cisplatin and Alimta. Should the cisplatin prove to be too problematic for her from a symptom management and performance status standpoint we will change the cisplatin to  carboplatin and complete the 4 planned cycles of adjuvant chemotherapy with carboplatin and Alimta.     Laural Benes, Shambhavi Salley E, PA-C     All questions were answered. The patient knows to call the clinic with any problems, questions or concerns. We can certainly see the patient much sooner if necessary.  I spent 20 minutes counseling the patient face to face. The total time spent in the appointment was 30 minutes.

## 2013-01-09 ENCOUNTER — Ambulatory Visit
Admission: RE | Admit: 2013-01-09 | Discharge: 2013-01-09 | Disposition: A | Discharge status: Home / Self Care | Payer: Medicare Other | Source: Ambulatory Visit | Attending: Internal Medicine | Admitting: Internal Medicine

## 2013-01-09 MED ORDER — GADOBENATE DIMEGLUMINE 529 MG/ML IV SOLN
13.0000 mL | Freq: Once | INTRAVENOUS | Status: AC | PRN
  Administered 2013-01-09: 13 mL via INTRAVENOUS

## 2013-01-12 ENCOUNTER — Other Ambulatory Visit (HOSPITAL_BASED_OUTPATIENT_CLINIC_OR_DEPARTMENT_OTHER): Payer: Medicare Other | Admitting: Lab

## 2013-01-12 DIAGNOSIS — C7A1 Malignant poorly differentiated neuroendocrine tumors: Secondary | ICD-10-CM

## 2013-01-12 LAB — COMPREHENSIVE METABOLIC PANEL (CC13)
ALT: 27 U/L (ref 0–55)
AST: 18 U/L (ref 5–34)
Alkaline Phosphatase: 85 U/L (ref 40–150)
BUN: 6.5 mg/dL — ABNORMAL LOW (ref 7.0–26.0)
Calcium: 9.8 mg/dL (ref 8.4–10.4)
Creatinine: 0.7 mg/dL (ref 0.6–1.1)
Total Bilirubin: 0.26 mg/dL (ref 0.20–1.20)

## 2013-01-12 LAB — CBC WITH DIFFERENTIAL/PLATELET
Basophils Absolute: 0.1 10*3/uL (ref 0.0–0.1)
EOS%: 4.3 % (ref 0.0–7.0)
HCT: 37.2 % (ref 34.8–46.6)
HGB: 12.8 g/dL (ref 11.6–15.9)
LYMPH%: 27.7 % (ref 14.0–49.7)
MCH: 30.8 pg (ref 25.1–34.0)
MCV: 89.4 fL (ref 79.5–101.0)
NEUT%: 59.5 % (ref 38.4–76.8)
Platelets: 164 10*3/uL (ref 145–400)
lymph#: 1.8 10*3/uL (ref 0.9–3.3)

## 2013-01-17 ENCOUNTER — Other Ambulatory Visit: Payer: Self-pay | Admitting: Thoracic Surgery (Cardiothoracic Vascular Surgery)

## 2013-01-19 ENCOUNTER — Other Ambulatory Visit: Payer: Self-pay | Admitting: Internal Medicine

## 2013-01-19 ENCOUNTER — Ambulatory Visit (HOSPITAL_BASED_OUTPATIENT_CLINIC_OR_DEPARTMENT_OTHER): Payer: Medicare Other | Admitting: Physician Assistant

## 2013-01-19 ENCOUNTER — Other Ambulatory Visit (HOSPITAL_BASED_OUTPATIENT_CLINIC_OR_DEPARTMENT_OTHER): Payer: Medicare Other | Admitting: Lab

## 2013-01-19 ENCOUNTER — Encounter: Payer: Self-pay | Admitting: Physician Assistant

## 2013-01-19 ENCOUNTER — Ambulatory Visit (HOSPITAL_BASED_OUTPATIENT_CLINIC_OR_DEPARTMENT_OTHER): Payer: Medicare Other

## 2013-01-19 DIAGNOSIS — C7A1 Malignant poorly differentiated neuroendocrine tumors: Secondary | ICD-10-CM

## 2013-01-19 DIAGNOSIS — Z5111 Encounter for antineoplastic chemotherapy: Secondary | ICD-10-CM

## 2013-01-19 LAB — CBC WITH DIFFERENTIAL/PLATELET
Basophils Absolute: 0 10*3/uL (ref 0.0–0.1)
Eosinophils Absolute: 0 10*3/uL (ref 0.0–0.5)
LYMPH%: 16.1 % (ref 14.0–49.7)
MCH: 30.7 pg (ref 25.1–34.0)
MCV: 88.7 fL (ref 79.5–101.0)
MONO%: 8.2 % (ref 0.0–14.0)
NEUT#: 4.2 10*3/uL (ref 1.5–6.5)
Platelets: 274 10*3/uL (ref 145–400)
RBC: 4.23 10*6/uL (ref 3.70–5.45)
nRBC: 0 % (ref 0–0)

## 2013-01-19 LAB — COMPREHENSIVE METABOLIC PANEL (CC13)
ALT: 20 U/L (ref 0–55)
CO2: 24 mEq/L (ref 22–29)
Calcium: 9.8 mg/dL (ref 8.4–10.4)
Chloride: 105 mEq/L (ref 98–107)
Sodium: 138 mEq/L (ref 136–145)
Total Protein: 6.6 g/dL (ref 6.4–8.3)

## 2013-01-19 LAB — MAGNESIUM (CC13): Magnesium: 2.2 mg/dl (ref 1.5–2.5)

## 2013-01-19 MED ORDER — SODIUM CHLORIDE 0.9 % IV SOLN
500.0000 mg/m2 | Freq: Once | INTRAVENOUS | Status: AC
  Administered 2013-01-19: 850 mg via INTRAVENOUS
  Filled 2013-01-19: qty 34

## 2013-01-19 MED ORDER — DEXAMETHASONE SODIUM PHOSPHATE 4 MG/ML IJ SOLN
12.0000 mg | Freq: Once | INTRAMUSCULAR | Status: AC
  Administered 2013-01-19: 12 mg via INTRAVENOUS

## 2013-01-19 MED ORDER — PALONOSETRON HCL INJECTION 0.25 MG/5ML
0.2500 mg | Freq: Once | INTRAVENOUS | Status: AC
  Administered 2013-01-19: 0.25 mg via INTRAVENOUS

## 2013-01-19 MED ORDER — SODIUM CHLORIDE 0.9 % IV SOLN
150.0000 mg | Freq: Once | INTRAVENOUS | Status: AC
  Administered 2013-01-19: 150 mg via INTRAVENOUS
  Filled 2013-01-19: qty 5

## 2013-01-19 MED ORDER — SODIUM CHLORIDE 0.9 % IV SOLN
Freq: Once | INTRAVENOUS | Status: AC
  Administered 2013-01-19: 10:00:00 via INTRAVENOUS

## 2013-01-19 MED ORDER — SODIUM CHLORIDE 0.9 % IV SOLN
75.0000 mg/m2 | Freq: Once | INTRAVENOUS | Status: AC
  Administered 2013-01-19: 127 mg via INTRAVENOUS
  Filled 2013-01-19: qty 127

## 2013-01-19 MED ORDER — POTASSIUM CHLORIDE 2 MEQ/ML IV SOLN
Freq: Once | INTRAVENOUS | Status: AC
  Administered 2013-01-19: 10:00:00 via INTRAVENOUS
  Filled 2013-01-19: qty 10

## 2013-01-19 NOTE — Progress Notes (Signed)
Pt voided 900 pre Cisplatin and 500 post Cisplatin.

## 2013-01-19 NOTE — Patient Instructions (Addendum)
Continue weekly labs Follow up in 3 weeks prior to your next scheduled cycle of adjuvant chemotherapy

## 2013-01-19 NOTE — Patient Instructions (Addendum)
[facility] Discharge Instructions for Patients Receiving Chemotherapy  Today you received the following chemotherapy agents: Cisplatin, Alimta.  To help prevent nausea and vomiting after your treatment, we encourage you to take your nausea medication.    If you develop nausea and vomiting that is not controlled by your nausea medication, call the clinic.   BELOW ARE SYMPTOMS THAT SHOULD BE REPORTED IMMEDIATELY:  *FEVER GREATER THAN 100.5 F  *CHILLS WITH OR WITHOUT FEVER  NAUSEA AND VOMITING THAT IS NOT CONTROLLED WITH YOUR NAUSEA MEDICATION  *UNUSUAL SHORTNESS OF BREATH  *UNUSUAL BRUISING OR BLEEDING  TENDERNESS IN MOUTH AND THROAT WITH OR WITHOUT PRESENCE OF ULCERS  *URINARY PROBLEMS  *BOWEL PROBLEMS  UNUSUAL RASH Items with * indicate a potential emergency and should be followed up as soon as possible.  Feel free to call the clinic you have any questions or concerns. The clinic phone number is [phone].    

## 2013-01-20 ENCOUNTER — Telehealth: Payer: Self-pay | Admitting: Internal Medicine

## 2013-01-24 NOTE — Progress Notes (Signed)
No images are attached to the encounter. No scans are attached to the encounter. No scans are attached to the encounter. [facility] OFFICE PROGRESS NOTE  MILLER, [name], MD 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  INTERVAL HISTORY: Sara Mcclure 67 y.o. female returns for a follow up visit for her non-small cell lung cancer, poorly differentiated neuroendocrine carcinoma now status post one cycle of adjuvant chemotherapy with cisplatin and Alimta. She presents to proceed with cycle #2 of her systemic chemotherapy with cisplatin and Alimta. She has had some cold symptoms and was placed on a Z-Pack. She reports she has also taken Advil cold and sinus. She had the MRI of her head done on 01/10/2013 and it was negative for brain metastasis.  MEDICAL HISTORY: Past Medical History  Diagnosis Date  . GERD (gastroesophageal reflux disease)   . Asthma   . Hypertension   . Diabetes mellitus   . Thyroid disease   . Anxiety   . Vertigo   . Breast cancer   . COPD (chronic obstructive pulmonary disease)   . PONV (postoperative nausea and vomiting)     ALLERGIES:  is allergic to metformin and related.  MEDICATIONS:  Current Outpatient Prescriptions  Medication Sig Dispense Refill  . albuterol (ACCUNEB) 1.25 MG/3ML nebulizer solution Take 1 ampule by nebulization 2 (two) times daily as needed. For shortness of breath      . albuterol (PROVENTIL HFA;VENTOLIN HFA) 108 (90 BASE) MCG/ACT inhaler Inhale 2 puffs into the lungs every 6 (six) hours as needed.  For shortness of breath      . ALPRAZolam (XANAX) 0.25 MG tablet Take 0.25 mg by mouth 3 (three) times daily as needed.       Marland Kitchen amLODipine (NORVASC) 5 MG tablet Take 1 tablet (5 mg total) by mouth daily.  30 tablet  1  . aspirin EC 81 MG tablet Take 81 mg by mouth daily.      . Cholecalciferol (VITAMIN D) 2000 UNITS tablet Take 2,000 Units by mouth 2 (two) times daily.      Marland Kitchen dexamethasone (DECADRON) 4 MG tablet Take 1 tablet (4 mg total) by mouth 2 (two) times daily with a meal.  40 tablet  0  . Fluticasone-Salmeterol (ADVAIR) 250-50 MCG/DOSE AEPB Inhale 1 puff into the lungs every 12 (twelve) hours.      . folic acid (FOLVITE) 1 MG tablet Take 1 tablet (1 mg total) by mouth daily.  30 tablet  3  . fosinopril (MONOPRIL) 20 MG tablet Take 20 mg by mouth daily.      Marland Kitchen levothyroxine (SYNTHROID, LEVOTHROID) 50 MCG tablet Take 50 mcg by mouth daily.      . metoprolol tartrate (LOPRESSOR) 25 MG tablet Take 1 tablet (25 mg total) by mouth 2 (two) times daily.  60 tablet  1  . montelukast (SINGULAIR) 10 MG tablet Take 10 mg by mouth at bedtime.      . ondansetron (ZOFRAN-ODT) 8 MG disintegrating tablet Take 8 mg by mouth every 8 (eight) hours as needed for nausea.      Marland Kitchen  oxyCODONE-acetaminophen (PERCOCET/ROXICET) 5-325 MG per tablet 1 tablet every 4 (four) hours as needed. Take 1-2 tablets eery 4 hours prn      . Probiotic Product (ALIGN PO) Take 1 capsule by mouth at bedtime.       . prochlorperazine (COMPAZINE) 10 MG tablet Take 1 tablet (10 mg total) by mouth every 6 (six) hours as needed.  60 tablet  0  . prochlorperazine (COMPAZINE) 25 MG suppository Place 25 mg rectally every 12 (twelve) hours as needed for nausea.      . RABEprazole (ACIPHEX) 20 MG tablet 1 tablet daily.      . sucralfate (CARAFATE) 1 GM/10ML suspension Take 1 g by mouth daily.        No current facility-administered medications for this visit.    SURGICAL HISTORY:  Past Surgical History  Procedure Laterality Date  .  Cardiac catherization    . Breast lumpectomy    . Tubal ligation    . Video assisted thoracoscopy (vats)/wedge resection Left 11/19/2012    Procedure: VIDEO ASSISTED THORACOSCOPY (VATS)/WEDGE RESECTION;  Surgeon: Loreli Slot, MD;  Location: [facility] OR;  Service: Thoracic;  Laterality: Left;  left upper lobe wedge resection  . Lobectomy Left 11/19/2012    Procedure: LOBECTOMY;  Surgeon: Loreli Slot, MD;  Location: [facility] OR;  Service: Thoracic;  Laterality: Left;  left upper lobe  . Lymph node dissection Left 11/19/2012    Procedure: LYMPH NODE DISSECTION;  Surgeon: Loreli Slot, MD;  Location: [facility] OR;  Service: Thoracic;  Laterality: Left;    REVIEW OF SYSTEMS:  A comprehensive review of systems was negative except for: URI symptoms   PHYSICAL EXAMINATION: General appearance: alert, cooperative, appears stated age and no distress Head: Normocephalic, without obvious abnormality, atraumatic Neck: no adenopathy, no carotid bruit, no JVD, supple, symmetrical, trachea midline and thyroid not enlarged, symmetric, no tenderness/mass/nodules Lymph nodes: Cervical, supraclavicular, and axillary nodes normal. Resp: clear to auscultation bilaterally Cardio: regular rate and rhythm, S1, S2 normal, no murmur, click, rub or gallop GI: soft, non-tender; bowel sounds normal; no masses,  no organomegaly Extremities: extremities normal, atraumatic, no cyanosis or edema Neurologic: Alert and oriented X 3, normal strength and tone. Normal symmetric reflexes. Normal coordination and gait  ECOG PERFORMANCE STATUS: 1 - Symptomatic but completely ambulatory  There were no vitals taken for this visit.  LABORATORY DATA: Lab Results  Component Value Date   WBC 5.6 01/19/2013   HGB 13.0 01/19/2013   HCT 37.5 01/19/2013   MCV 88.7 01/19/2013   PLT 274 01/19/2013      Chemistry      Component Value Date/Time   NA 138 01/19/2013 0922   NA 140 11/21/2012 0445   K 3.8 01/19/2013 0922   K 3.8 11/21/2012 0445   CL  105 01/19/2013 0922   CL 105 11/21/2012 0445   CO2 24 01/19/2013 0922   CO2 27 11/21/2012 0445   BUN 12.3 01/19/2013 0922   BUN 6 11/21/2012 0445   CREATININE 0.8 01/19/2013 0922   CREATININE 0.55 11/21/2012 0445      Component Value Date/Time   CALCIUM 9.8 01/19/2013 0922   CALCIUM 9.3 11/21/2012 0445   ALKPHOS 87 01/19/2013 0922   ALKPHOS 65 11/21/2012 0445   AST 17 01/19/2013 0922   AST 15 11/21/2012 0445   ALT 20 01/19/2013 0922   ALT 16 11/21/2012 0445   BILITOT 0.31 01/19/2013 0922   BILITOT 0.4 11/21/2012 0445       RADIOGRAPHIC  STUDIES:  No results found.   ASSESSMENT/PLAN: Patient is a very pleasant 67 year old white female recently diagnosed with stage IIA non-small cell lung cancer (T2 A., N1, M0) consistent with high-grade poorly differentiated your endocrine carcinoma, large cell type. She is status post left upper lobectomy with lymph node dissection. She's currently receiving adjuvant chemotherapy in the form of cisplatin 75 mg per meter squared and Alimta 500 mg region squared given every 3 weeks for a total of 4 planned cycles. She is now status post one cycle. The patient was discussed with Dr. Arbutus Ped. She will proceed with cycle #2 of her adjuvant chemotherapy with cisplatin and Alimta. She will continue with Zofran for nausea and vomiting.  She'll continue with weekly labs consisting of a CBC differential, C. met and magnesium. She'll return in 3 weeks prior to her next scheduled cycle of adjuvant chemotherapy with cisplatin and Alimta. Should the cisplatin prove to be too problematic for her from a symptom management and performance status standpoint we will change the cisplatin to carboplatin and complete the 4 planned cycles of adjuvant chemotherapy with carboplatin and Alimta.   Laural Benes, Sara Delpozo E, PA-C   All questions were answered. The patient knows to call the clinic with any problems, questions or concerns. We can certainly see the patient much sooner if necessary.  I spent 20 minutes  counseling the patient face to face. The total time spent in the appointment was 30 minutes.

## 2013-01-26 ENCOUNTER — Other Ambulatory Visit (HOSPITAL_BASED_OUTPATIENT_CLINIC_OR_DEPARTMENT_OTHER): Payer: Medicare Other

## 2013-01-26 ENCOUNTER — Telehealth: Payer: Self-pay | Admitting: *Deleted

## 2013-01-26 DIAGNOSIS — C7A1 Malignant poorly differentiated neuroendocrine tumors: Secondary | ICD-10-CM

## 2013-01-26 LAB — CBC WITH DIFFERENTIAL/PLATELET
BASO%: 0.9 % (ref 0.0–2.0)
Basophils Absolute: 0 10*3/uL (ref 0.0–0.1)
EOS%: 1.5 % (ref 0.0–7.0)
HGB: 13.2 g/dL (ref 11.6–15.9)
MCH: 31 pg (ref 25.1–34.0)
MCHC: 35.2 g/dL (ref 31.5–36.0)
MCV: 88.1 fL (ref 79.5–101.0)
MONO%: 6.9 % (ref 0.0–14.0)
RDW: 14.7 % — ABNORMAL HIGH (ref 11.2–14.5)
lymph#: 1 10*3/uL (ref 0.9–3.3)

## 2013-01-26 LAB — COMPREHENSIVE METABOLIC PANEL (CC13)
AST: 39 U/L — ABNORMAL HIGH (ref 5–34)
Alkaline Phosphatase: 82 U/L (ref 40–150)
BUN: 8.7 mg/dL (ref 7.0–26.0)
Glucose: 148 mg/dl — ABNORMAL HIGH (ref 70–99)
Potassium: 3.4 mEq/L — ABNORMAL LOW (ref 3.5–5.1)
Sodium: 134 mEq/L — ABNORMAL LOW (ref 136–145)
Total Bilirubin: 0.36 mg/dL (ref 0.20–1.20)
Total Protein: 5.9 g/dL — ABNORMAL LOW (ref 6.4–8.3)

## 2013-01-26 MED ORDER — LORAZEPAM 0.5 MG PO TABS: 0.5000 mg | ORAL_TABLET | Freq: Four times a day (QID) | ORAL | Status: DC | PRN

## 2013-01-26 NOTE — Telephone Encounter (Signed)
Patient presents to office for weekly labs and reports nausea for 1 week with emesis daily. Too sick to eat anything except "bites". She has been able to keep down fluids and some bites of foods. Taking zofran 8 mg odt every 8 hours and compazine suppository every 6 hours. Is not on her carafate or dexamethasone-reports these have been discontinued. Weight is less,but stable. Reviewed CBC results and neutropenic precautions. Spoke with Dr. Arbutus Ped and ordered Ativan 0.5 mg every 6 hours prn. Reviewed medication regimen with patient and husband and she was able to repeat to nurse with written notes to guide her. She will continue to push liquids today and attempt bland diet tomorrow if better. Will request dietician see her at next chemo appointment since she is losing weight.  Total time in consult for walk-in visit 25 minutes.

## 2013-02-02 ENCOUNTER — Other Ambulatory Visit (HOSPITAL_BASED_OUTPATIENT_CLINIC_OR_DEPARTMENT_OTHER): Payer: Medicare Other

## 2013-02-02 DIAGNOSIS — C7A1 Malignant poorly differentiated neuroendocrine tumors: Secondary | ICD-10-CM

## 2013-02-02 LAB — CBC WITH DIFFERENTIAL/PLATELET
Basophils Absolute: 0 10*3/uL (ref 0.0–0.1)
EOS%: 1.3 % (ref 0.0–7.0)
Eosinophils Absolute: 0.1 10*3/uL (ref 0.0–0.5)
HGB: 13.3 g/dL (ref 11.6–15.9)
LYMPH%: 34 % (ref 14.0–49.7)
MCH: 30.5 pg (ref 25.1–34.0)
MCV: 88.7 fL (ref 79.5–101.0)
MONO%: 10.9 % (ref 0.0–14.0)
NEUT#: 2.5 10*3/uL (ref 1.5–6.5)
Platelets: 171 10*3/uL (ref 145–400)

## 2013-02-02 LAB — COMPREHENSIVE METABOLIC PANEL (CC13)
ALT: 29 U/L (ref 0–55)
AST: 21 U/L (ref 5–34)
Alkaline Phosphatase: 86 U/L (ref 40–150)
CO2: 24 mEq/L (ref 22–29)
Creatinine: 0.8 mg/dL (ref 0.6–1.1)
Sodium: 136 mEq/L (ref 136–145)
Total Bilirubin: 0.38 mg/dL (ref 0.20–1.20)
Total Protein: 6.5 g/dL (ref 6.4–8.3)

## 2013-02-02 LAB — MAGNESIUM (CC13): Magnesium: 2.2 mg/dl (ref 1.5–2.5)

## 2013-02-09 ENCOUNTER — Other Ambulatory Visit (HOSPITAL_BASED_OUTPATIENT_CLINIC_OR_DEPARTMENT_OTHER): Payer: Medicare Other | Admitting: Lab

## 2013-02-09 ENCOUNTER — Encounter: Payer: Self-pay | Admitting: Internal Medicine

## 2013-02-09 ENCOUNTER — Ambulatory Visit: Payer: Medicare Other | Admitting: Nutrition

## 2013-02-09 ENCOUNTER — Other Ambulatory Visit: Payer: Medicare Other | Admitting: Lab

## 2013-02-09 ENCOUNTER — Ambulatory Visit (HOSPITAL_BASED_OUTPATIENT_CLINIC_OR_DEPARTMENT_OTHER): Payer: Medicare Other | Admitting: Physician Assistant

## 2013-02-09 ENCOUNTER — Ambulatory Visit (HOSPITAL_BASED_OUTPATIENT_CLINIC_OR_DEPARTMENT_OTHER): Payer: Medicare Other

## 2013-02-09 ENCOUNTER — Telehealth: Payer: Self-pay | Admitting: Internal Medicine

## 2013-02-09 DIAGNOSIS — C7A1 Malignant poorly differentiated neuroendocrine tumors: Secondary | ICD-10-CM

## 2013-02-09 DIAGNOSIS — Z5111 Encounter for antineoplastic chemotherapy: Secondary | ICD-10-CM

## 2013-02-09 DIAGNOSIS — I10 Essential (primary) hypertension: Secondary | ICD-10-CM

## 2013-02-09 LAB — COMPREHENSIVE METABOLIC PANEL (CC13)
ALT: 16 U/L (ref 0–55)
Albumin: 3.3 g/dL — ABNORMAL LOW (ref 3.5–5.0)
BUN: 10.7 mg/dL (ref 7.0–26.0)
CO2: 23 mEq/L (ref 22–29)
Calcium: 9.7 mg/dL (ref 8.4–10.4)
Chloride: 103 mEq/L (ref 98–107)
Creatinine: 0.9 mg/dL (ref 0.6–1.1)

## 2013-02-09 LAB — CBC WITH DIFFERENTIAL/PLATELET
BASO%: 0.3 % (ref 0.0–2.0)
EOS%: 0.3 % (ref 0.0–7.0)
HCT: 35.5 % (ref 34.8–46.6)
LYMPH%: 19.4 % (ref 14.0–49.7)
MCH: 31.2 pg (ref 25.1–34.0)
MCHC: 35.2 g/dL (ref 31.5–36.0)
MCV: 88.5 fL (ref 79.5–101.0)
MONO#: 1.1 10*3/uL — ABNORMAL HIGH (ref 0.1–0.9)
MONO%: 16.2 % — ABNORMAL HIGH (ref 0.0–14.0)
NEUT%: 63.8 % (ref 38.4–76.8)
Platelets: 277 10*3/uL (ref 145–400)
RBC: 4.01 10*6/uL (ref 3.70–5.45)
nRBC: 0 % (ref 0–0)

## 2013-02-09 MED ORDER — SODIUM CHLORIDE 0.9 % IV SOLN
75.0000 mg/m2 | Freq: Once | INTRAVENOUS | Status: AC
  Administered 2013-02-09: 127 mg via INTRAVENOUS
  Filled 2013-02-09: qty 127

## 2013-02-09 MED ORDER — POTASSIUM CHLORIDE 2 MEQ/ML IV SOLN
Freq: Once | INTRAVENOUS | Status: AC
  Administered 2013-02-09: 10:00:00 via INTRAVENOUS
  Filled 2013-02-09: qty 10

## 2013-02-09 MED ORDER — CYANOCOBALAMIN 1000 MCG/ML IJ SOLN
1000.0000 ug | Freq: Once | INTRAMUSCULAR | Status: AC
  Administered 2013-02-09: 1000 ug via INTRAMUSCULAR

## 2013-02-09 MED ORDER — PALONOSETRON HCL INJECTION 0.25 MG/5ML
0.2500 mg | Freq: Once | INTRAVENOUS | Status: AC
Start: 2013-02-09 — End: 2013-02-09
  Administered 2013-02-09: 0.25 mg via INTRAVENOUS

## 2013-02-09 MED ORDER — FOSAPREPITANT DIMEGLUMINE INJECTION 150 MG
150.0000 mg | Freq: Once | INTRAVENOUS | Status: AC
  Administered 2013-02-09: 150 mg via INTRAVENOUS
  Filled 2013-02-09: qty 5

## 2013-02-09 MED ORDER — SODIUM CHLORIDE 0.9 % IV SOLN
Freq: Once | INTRAVENOUS | Status: AC
  Administered 2013-02-09: 10:00:00 via INTRAVENOUS

## 2013-02-09 MED ORDER — DEXAMETHASONE SODIUM PHOSPHATE 4 MG/ML IJ SOLN
12.0000 mg | Freq: Once | INTRAMUSCULAR | Status: AC
  Administered 2013-02-09: 12 mg via INTRAVENOUS

## 2013-02-09 MED ORDER — SODIUM CHLORIDE 0.9 % IV SOLN
500.0000 mg/m2 | Freq: Once | INTRAVENOUS | Status: AC
  Administered 2013-02-09: 850 mg via INTRAVENOUS
  Filled 2013-02-09: qty 34

## 2013-02-09 NOTE — Telephone Encounter (Signed)
gv and printd appt sched and avs for pt for May

## 2013-02-09 NOTE — Patient Instructions (Addendum)
Continue weekly labs as scheduled Follow with Dr. Arbutus Ped in 3 weeks prior to her next scheduled cycle of chemotherapy

## 2013-02-09 NOTE — Progress Notes (Signed)
Pt voided  750cc pre CDDP;  Voided  1500cc post CDDP.

## 2013-02-09 NOTE — Patient Instructions (Signed)
[facility] Discharge Instructions for Patients Receiving Chemotherapy  Today you received the following chemotherapy agents :  Alimta,  Cisplatin.  To help prevent nausea and vomiting after your treatment, we encourage you to take your nausea medication as instructed by your physician.    If you develop nausea and vomiting that is not controlled by your nausea medication, call the clinic. If it is after clinic hours your family physician or the after hours number for the clinic or go to the Emergency Department.   BELOW ARE SYMPTOMS THAT SHOULD BE REPORTED IMMEDIATELY:  *FEVER GREATER THAN 100.5 F  *CHILLS WITH OR WITHOUT FEVER  NAUSEA AND VOMITING THAT IS NOT CONTROLLED WITH YOUR NAUSEA MEDICATION  *UNUSUAL SHORTNESS OF BREATH  *UNUSUAL BRUISING OR BLEEDING  TENDERNESS IN MOUTH AND THROAT WITH OR WITHOUT PRESENCE OF ULCERS  *URINARY PROBLEMS  *BOWEL PROBLEMS  UNUSUAL RASH Items with * indicate a potential emergency and should be followed up as soon as possible.  One of the nurses will contact you 24 hours after your treatment. Please let the nurse know about any problems that you may have experienced. Feel free to call the clinic you have any questions or concerns. The clinic phone number is [phone].   I have been informed and understand all the instructions given to me. I know to contact the clinic, my physician, or go to the Emergency Department if any problems should occur. I do not have any questions at this time, but understand that I may call the clinic during office hours   should I have any questions or need assistance in obtaining follow up care.    __________________________________________  _____________  __________ Signature of Patient or Authorized Representative            Date                   Time    __________________________________________ Nurse's Signature

## 2013-02-09 NOTE — Progress Notes (Signed)
Patient is a 67 year old female patient of Dr. Shirline Frees diagnosed with lung cancer.  Past medical history includes GERD, anxiety, tobacco, hypertension, diabetes, thyroid disease, COPD, postop nausea/vomiting, and breast cancer 17 years ago.  Medications include Xanax, vitamin D, Decadron, Folvite, Synthroid, Zofran,Align, Compazine, and Carafate.  Labs include glucose 124, Bun 6.9, albumin 3.2.  Height: 66 inches. Weight: 130.1 pounds on April 29. Usual body weight: 158 pounds July 2013. BMI: 21.01.  Patient states she has a history of nausea and vomiting. She did not tolerate her last treatment well. She reports eating less secondary to not feeling well.  Nutrition diagnosis: Unintended weight loss related to diagnosis of lung cancer and associated treatments as evidenced by 18% weight loss in the past 9 months.  Intervention: I educated patient on the importance of high-calorie, high-protein foods in small frequent meals and snacks daily. I've reviewed ways to add calories to the food she currently consumes. I've encouraged patient to explore oral nutrition supplements. I've educated her on strategies for eating with nausea and vomiting and early satiety. Provided fact sheets for her to take with her today and answered her questions. Teach back method used.  Monitoring, evaluation, goals: Patient will tolerate increased calories and protein to minimize further weight loss.  Next visit: Tuesday, May 20, during chemotherapy.

## 2013-02-10 NOTE — Progress Notes (Signed)
No images are attached to the encounter. No scans are attached to the encounter. No scans are attached to the encounter. 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  INTERVAL HISTORY: Sara Mcclure 67 y.o. female returns for a follow up visit for her non-small cell lung cancer, poorly differentiated neuroendocrine carcinoma now status post 2 cycles of adjuvant chemotherapy with cisplatin and Alimta. She presents to proceed with cycle #3 of her systemic chemotherapy with cisplatin and Alimta. She still reports some problems with nausea but also reports that she "forgot" to take her medications for nausea as prescribed and advised. She reports she is no longer taking oxycodone and she is not having any pain. She is working with her primary care physician Dr. Hyacinth Meeker on her blood pressure control.   MEDICAL HISTORY: Past Medical History  Diagnosis Date  . GERD (gastroesophageal reflux disease)   . Asthma   . Hypertension   . Diabetes mellitus   . Thyroid disease   . Anxiety   . Vertigo   . Breast cancer   . COPD (chronic obstructive pulmonary disease)   . PONV (postoperative nausea and vomiting)     ALLERGIES:  is allergic to metformin and related.  MEDICATIONS:  Current Outpatient Prescriptions  Medication Sig Dispense Refill  . albuterol (ACCUNEB) 1.25 MG/3ML nebulizer solution Take 1 ampule by nebulization 2 (two) times daily as needed. For shortness of breath      . albuterol (PROVENTIL  HFA;VENTOLIN HFA) 108 (90 BASE) MCG/ACT inhaler Inhale 2 puffs into the lungs every 6 (six) hours as needed. For shortness of breath      . ALPRAZolam (XANAX) 0.25 MG tablet Take 0.25 mg by mouth 3 (three) times daily as needed.       Marland Kitchen amLODipine (NORVASC) 5 MG tablet Take 1 tablet (5 mg total) by mouth daily.  30 tablet  1  . aspirin EC 81 MG tablet Take 81 mg by mouth daily.      . Cholecalciferol (VITAMIN D) 2000 UNITS tablet Take 2,000 Units by mouth 2 (two) times daily.      Marland Kitchen dexamethasone (DECADRON) 4 MG tablet Take 1 tablet (4 mg total) by mouth 2 (two) times daily with a meal.  40 tablet  0  . Fluticasone-Salmeterol (ADVAIR) 250-50 MCG/DOSE AEPB Inhale 1 puff into the lungs every 12 (twelve) hours.      . folic acid (FOLVITE) 1 MG tablet Take 1 tablet (1 mg total) by mouth daily.  30 tablet  3  . fosinopril (MONOPRIL) 20 MG tablet Take 20 mg by mouth daily.      Marland Kitchen levothyroxine (SYNTHROID, LEVOTHROID) 50 MCG tablet Take 50 mcg by mouth daily.      Marland Kitchen LORazepam (ATIVAN) 0.5 MG tablet Place 1 tablet (0.5 mg total) under the tongue every 6 (six) hours as needed (or oral as needed for nausea).  30 tablet  0  . metoprolol tartrate (LOPRESSOR) 25 MG tablet Take 1 tablet (25 mg total) by mouth 2 (two) times daily.  60  tablet  1  . montelukast (SINGULAIR) 10 MG tablet Take 10 mg by mouth at bedtime.      . ondansetron (ZOFRAN-ODT) 8 MG disintegrating tablet Take 8 mg by mouth every 8 (eight) hours as needed for nausea.      . Probiotic Product (ALIGN PO) Take 1 capsule by mouth at bedtime.       . prochlorperazine (COMPAZINE) 10 MG tablet Take 1 tablet (10 mg total) by mouth every 6 (six) hours as needed.  60 tablet  0  . prochlorperazine (COMPAZINE) 25 MG suppository Place 25 mg rectally every 12 (twelve) hours as needed for nausea.      . RABEprazole (ACIPHEX) 20 MG tablet 1 tablet daily.      . sucralfate (CARAFATE) 1 GM/10ML suspension Take 1 g by mouth daily.        No current  facility-administered medications for this visit.    SURGICAL HISTORY:  Past Surgical History  Procedure Laterality Date  . Cardiac catherization    . Breast lumpectomy    . Tubal ligation    . Video assisted thoracoscopy (vats)/wedge resection Left 11/19/2012    Procedure: VIDEO ASSISTED THORACOSCOPY (VATS)/WEDGE RESECTION;  Surgeon: Loreli Slot, MD;  Location: [facility] OR;  Service: Thoracic;  Laterality: Left;  left upper lobe wedge resection  . Lobectomy Left 11/19/2012    Procedure: LOBECTOMY;  Surgeon: Loreli Slot, MD;  Location: [facility] OR;  Service: Thoracic;  Laterality: Left;  left upper lobe  . Lymph node dissection Left 11/19/2012    Procedure: LYMPH NODE DISSECTION;  Surgeon: Loreli Slot, MD;  Location: [facility] OR;  Service: Thoracic;  Laterality: Left;    REVIEW OF SYSTEMS:  A comprehensive review of systems was negative except for: Gastrointestinal: positive for nausea   PHYSICAL EXAMINATION: General appearance: alert, cooperative, appears stated age and no distress Head: Normocephalic, without obvious abnormality, atraumatic Neck: no adenopathy, no carotid bruit, no JVD, supple, symmetrical, trachea midline and thyroid not enlarged, symmetric, no tenderness/mass/nodules Lymph nodes: Cervical, supraclavicular, and axillary nodes normal. Resp: clear to auscultation bilaterally Cardio: regular rate and rhythm, S1, S2 normal, no murmur, click, rub or gallop GI: soft, non-tender; bowel sounds normal; no masses,  no organomegaly Extremities: extremities normal, atraumatic, no cyanosis or edema Neurologic: Alert and oriented X 3, normal strength and tone. Normal symmetric reflexes. Normal coordination and gait  ECOG PERFORMANCE STATUS: 1 - Symptomatic but completely ambulatory  Blood pressure 180/83, pulse 72, temperature 97.7 F (36.5 C), temperature source Oral, resp. rate 20, height 5\' 6"  (1.676 m), weight 130 lb 1.6 oz (59.013 kg).  LABORATORY DATA: Lab Results   Component Value Date   WBC 6.7 02/09/2013   HGB 12.5 02/09/2013   HCT 35.5 02/09/2013   MCV 88.5 02/09/2013   PLT 277 02/09/2013      Chemistry      Component Value Date/Time   NA 136 02/09/2013 0843   NA 140 11/21/2012 0445   K 3.9 02/09/2013 0843   K 3.8 11/21/2012 0445   CL 103 02/09/2013 0843   CL 105 11/21/2012 0445   CO2 23 02/09/2013 0843   CO2 27 11/21/2012 0445   BUN 10.7 02/09/2013 0843   BUN 6 11/21/2012 0445   CREATININE 0.9 02/09/2013 0843   CREATININE 0.55 11/21/2012 0445      Component Value Date/Time   CALCIUM 9.7 02/09/2013 0843   CALCIUM 9.3 11/21/2012 0445   ALKPHOS 71 02/09/2013 0843   ALKPHOS 65 11/21/2012 0445  AST 16 02/09/2013 0843   AST 15 11/21/2012 0445   ALT 16 02/09/2013 0843   ALT 16 11/21/2012 0445   BILITOT 0.33 02/09/2013 0843   BILITOT 0.4 11/21/2012 0445       RADIOGRAPHIC STUDIES:  No results found.   ASSESSMENT/PLAN: Patient is a very pleasant 68 year old white female recently diagnosed with stage IIA non-small cell lung cancer (T2 A., N1, M0) consistent with high-grade poorly differentiated your endocrine carcinoma, large cell type. She is status post left upper lobectomy with lymph node dissection. She's currently receiving adjuvant chemotherapy in the form of cisplatin 75 mg per meter squared and Alimta 500 mg region squared given every 3 weeks for a total of 4 planned cycles. She is now status post 2 cycles. The patient was discussed with Dr. Arbutus Ped. She will proceed with cycle #3 of her adjuvant chemotherapy with cisplatin and Alimta. She will continue with Zofran for nausea and vomiting.  She'll continue with weekly labs consisting of a CBC differential, C. met and magnesium. She'll followup with Dr. Arbutus Ped in 3 weeks prior to her next scheduled cycle of adjuvant chemotherapy with cisplatin and Alimta.    Laural Benes, Brodan Grewell E, PA-C   All questions were answered. The patient knows to call the clinic with any problems, questions or concerns. We can certainly see  the patient much sooner if necessary.  I spent 20 minutes counseling the patient face to face. The total time spent in the appointment was 30 minutes.

## 2013-02-13 ENCOUNTER — Other Ambulatory Visit: Payer: Self-pay | Admitting: Thoracic Surgery (Cardiothoracic Vascular Surgery)

## 2013-02-16 ENCOUNTER — Telehealth: Payer: Self-pay | Admitting: *Deleted

## 2013-02-16 ENCOUNTER — Other Ambulatory Visit (HOSPITAL_BASED_OUTPATIENT_CLINIC_OR_DEPARTMENT_OTHER): Payer: Medicare Other

## 2013-02-16 DIAGNOSIS — C7A1 Malignant poorly differentiated neuroendocrine tumors: Secondary | ICD-10-CM

## 2013-02-16 LAB — COMPREHENSIVE METABOLIC PANEL (CC13)
ALT: 21 U/L (ref 0–55)
CO2: 27 mEq/L (ref 22–29)
Sodium: 135 mEq/L — ABNORMAL LOW (ref 136–145)
Total Bilirubin: 0.62 mg/dL (ref 0.20–1.20)
Total Protein: 6.5 g/dL (ref 6.4–8.3)

## 2013-02-16 LAB — CBC WITH DIFFERENTIAL/PLATELET
Eosinophils Absolute: 0 10*3/uL (ref 0.0–0.5)
MCV: 88.7 fL (ref 79.5–101.0)
MONO#: 0.3 10*3/uL (ref 0.1–0.9)
MONO%: 7.7 % (ref 0.0–14.0)
NEUT#: 1.5 10*3/uL (ref 1.5–6.5)
RBC: 4.04 10*6/uL (ref 3.70–5.45)
RDW: 15 % — ABNORMAL HIGH (ref 11.2–14.5)
WBC: 3.5 10*3/uL — ABNORMAL LOW (ref 3.9–10.3)

## 2013-02-16 LAB — MAGNESIUM (CC13): Magnesium: 2 mg/dl (ref 1.5–2.5)

## 2013-02-16 NOTE — Telephone Encounter (Signed)
Patient in for lab only today requesting to speak with nurse. This triage nurse went to lobby to speak with patient regarding complaints of vomiting last night approx 6-7 times. Patient states she has terrible reflux/gas/belching since starting chemo. It was noted that patient has had hx of this and previously on Aciphex, which she denies using at this time. Pt currently taking Zofran, Compazine suppository and Ativan SL for nausea. Bowels moving as usual, generally with aid of stool softner. She has had a bowel movement today. Patient states she is still eating and drinking as much as possible, she does not appear to be in any immediate danger of dehydration at this time. Encouraged patient to start taking Aciphex and she may also try some other OTC for indigestion/gas meds such as Mylanta or Gaviscon. Patient will call us back tomorrow if she has any problems today or tonight with vomiting in the event we need to further assess the situation for other causes. We will call her this afternoon if there are any issues with her pending labs. Patient and spouse verbalized understanding of plan of care.

## 2013-02-23 ENCOUNTER — Other Ambulatory Visit (HOSPITAL_BASED_OUTPATIENT_CLINIC_OR_DEPARTMENT_OTHER): Payer: Medicare Other

## 2013-02-23 DIAGNOSIS — C3410 Malignant neoplasm of upper lobe, unspecified bronchus or lung: Secondary | ICD-10-CM

## 2013-02-23 LAB — COMPREHENSIVE METABOLIC PANEL (CC13)
ALT: 18 U/L (ref 0–55)
AST: 16 U/L (ref 5–34)
Alkaline Phosphatase: 69 U/L (ref 40–150)
Creatinine: 0.9 mg/dL (ref 0.6–1.1)
Total Bilirubin: 0.33 mg/dL (ref 0.20–1.20)

## 2013-02-23 LAB — CBC WITH DIFFERENTIAL/PLATELET
BASO%: 0.4 % (ref 0.0–2.0)
EOS%: 2.4 % (ref 0.0–7.0)
LYMPH%: 33.2 % (ref 14.0–49.7)
MCHC: 35.5 g/dL (ref 31.5–36.0)
MCV: 89.5 fL (ref 79.5–101.0)
MONO#: 0.5 10*3/uL (ref 0.1–0.9)
MONO%: 9.8 % (ref 0.0–14.0)
Platelets: 147 10*3/uL (ref 145–400)
RBC: 3.8 10*6/uL (ref 3.70–5.45)
WBC: 4.6 10*3/uL (ref 3.9–10.3)

## 2013-02-23 LAB — MAGNESIUM (CC13): Magnesium: 2.2 mg/dl (ref 1.5–2.5)

## 2013-02-26 ENCOUNTER — Other Ambulatory Visit: Payer: Self-pay | Admitting: Internal Medicine

## 2013-02-26 ENCOUNTER — Other Ambulatory Visit: Payer: Self-pay | Admitting: *Deleted

## 2013-03-02 ENCOUNTER — Other Ambulatory Visit: Payer: Medicare Other | Admitting: Lab

## 2013-03-02 ENCOUNTER — Telehealth: Payer: Self-pay | Admitting: Internal Medicine

## 2013-03-02 ENCOUNTER — Ambulatory Visit: Payer: Medicare Other | Admitting: Nutrition

## 2013-03-02 ENCOUNTER — Other Ambulatory Visit (HOSPITAL_BASED_OUTPATIENT_CLINIC_OR_DEPARTMENT_OTHER): Payer: Medicare Other | Admitting: Lab

## 2013-03-02 ENCOUNTER — Encounter: Payer: Self-pay | Admitting: Internal Medicine

## 2013-03-02 ENCOUNTER — Ambulatory Visit (HOSPITAL_BASED_OUTPATIENT_CLINIC_OR_DEPARTMENT_OTHER): Payer: Medicare Other

## 2013-03-02 ENCOUNTER — Ambulatory Visit (HOSPITAL_BASED_OUTPATIENT_CLINIC_OR_DEPARTMENT_OTHER): Payer: Medicare Other | Admitting: Internal Medicine

## 2013-03-02 DIAGNOSIS — C7A1 Malignant poorly differentiated neuroendocrine tumors: Secondary | ICD-10-CM

## 2013-03-02 DIAGNOSIS — Z5111 Encounter for antineoplastic chemotherapy: Secondary | ICD-10-CM

## 2013-03-02 LAB — CBC WITH DIFFERENTIAL/PLATELET
Basophils Absolute: 0 10*3/uL (ref 0.0–0.1)
Eosinophils Absolute: 0 10*3/uL (ref 0.0–0.5)
HCT: 33.1 % — ABNORMAL LOW (ref 34.8–46.6)
HGB: 11.7 g/dL (ref 11.6–15.9)
LYMPH%: 22.3 % (ref 14.0–49.7)
MCV: 88.7 fL (ref 79.5–101.0)
MONO%: 19.1 % — ABNORMAL HIGH (ref 0.0–14.0)
NEUT#: 3 10*3/uL (ref 1.5–6.5)
Platelets: 256 10*3/uL (ref 145–400)
RDW: 15.4 % — ABNORMAL HIGH (ref 11.2–14.5)

## 2013-03-02 LAB — COMPREHENSIVE METABOLIC PANEL (CC13)
ALT: 13 U/L (ref 0–55)
CO2: 24 mEq/L (ref 22–29)
Calcium: 9.8 mg/dL (ref 8.4–10.4)
Chloride: 102 mEq/L (ref 98–107)
Creatinine: 1 mg/dL (ref 0.6–1.1)
Total Protein: 6.8 g/dL (ref 6.4–8.3)

## 2013-03-02 LAB — MAGNESIUM (CC13): Magnesium: 2.4 mg/dl (ref 1.5–2.5)

## 2013-03-02 MED ORDER — SODIUM CHLORIDE 0.9 % IV SOLN
150.0000 mg | Freq: Once | INTRAVENOUS | Status: AC
  Administered 2013-03-02: 150 mg via INTRAVENOUS
  Filled 2013-03-02: qty 5

## 2013-03-02 MED ORDER — PALONOSETRON HCL INJECTION 0.25 MG/5ML
0.2500 mg | Freq: Once | INTRAVENOUS | Status: AC
  Administered 2013-03-02: 0.25 mg via INTRAVENOUS

## 2013-03-02 MED ORDER — SODIUM CHLORIDE 0.9 % IV SOLN
75.0000 mg/m2 | Freq: Once | INTRAVENOUS | Status: AC
  Administered 2013-03-02: 127 mg via INTRAVENOUS
  Filled 2013-03-02: qty 127

## 2013-03-02 MED ORDER — POTASSIUM CHLORIDE 2 MEQ/ML IV SOLN
Freq: Once | INTRAVENOUS | Status: AC
  Administered 2013-03-02: 11:00:00 via INTRAVENOUS
  Filled 2013-03-02: qty 10

## 2013-03-02 MED ORDER — SODIUM CHLORIDE 0.9 % IV SOLN
Freq: Once | INTRAVENOUS | Status: AC
  Administered 2013-03-02: 11:00:00 via INTRAVENOUS

## 2013-03-02 MED ORDER — SODIUM CHLORIDE 0.9 % IV SOLN
500.0000 mg/m2 | Freq: Once | INTRAVENOUS | Status: AC
  Administered 2013-03-02: 850 mg via INTRAVENOUS
  Filled 2013-03-02: qty 34

## 2013-03-02 MED ORDER — DEXAMETHASONE SODIUM PHOSPHATE 20 MG/5ML IJ SOLN
12.0000 mg | Freq: Once | INTRAMUSCULAR | Status: AC
  Administered 2013-03-02: 12 mg via INTRAVENOUS

## 2013-03-02 NOTE — Telephone Encounter (Signed)
gv and printed appt sched and avs for pt for May and June °

## 2013-03-02 NOTE — Progress Notes (Signed)
I attempted to speak with patient twice today during her chemotherapy for nutrition followup. Patient was sleeping soundly and did not awaken to her name. She continues to have weight loss with weight documented as 128 pounds down from 130.1 pounds in April 29. Patient does not have further chemotherapy scheduled at this time. Patient has been provided with RDs contact information for questions or concerns in the future. I am available to assist her as needed. No followup appointment made at this time.

## 2013-03-02 NOTE — Progress Notes (Signed)
[facility] Telephone:([phone]) [phone]   Fax:(336) [phone]  OFFICE PROGRESS NOTE  Annamaria Boots, MD 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  PRIOR THERAPY: Status post left upper lobectomy with lymph node dissection   CURRENT THERAPY: Adjuvant systemic chemotherapy with cisplatin 75 mg region squared and Alimta 500 mg per meter squared given every 3 weeks for total of 4 cycles, status post 3 cycles.  INTERVAL HISTORY: Sara Mcclure 67 y.o. female returns to the clinic today for followup visit accompanied her husband. The patient tolerated the last cycle of her systemic chemotherapy fairly well except for mild nausea a few days after the chemotherapy. She denied having any significant weight loss or night sweats. She has no fever or chills. The patient has no significant chest pain, shortness breath, cough or hemoptysis.  MEDICAL HISTORY: Past Medical History  Diagnosis Date  . GERD (gastroesophageal reflux disease)   . Asthma   . Hypertension   . Diabetes mellitus   . Thyroid disease   . Anxiety   . Vertigo   . Breast cancer   . COPD (chronic obstructive pulmonary disease)   . PONV (postoperative nausea and vomiting)     ALLERGIES:  is allergic to metformin and related.  MEDICATIONS:  Current Outpatient Prescriptions  Medication Sig Dispense Refill  . albuterol (ACCUNEB) 1.25 MG/3ML nebulizer solution Take 1 ampule by nebulization 2 (two) times daily as needed. For shortness of breath      . albuterol (PROVENTIL HFA;VENTOLIN HFA) 108 (90 BASE) MCG/ACT inhaler Inhale 2 puffs into the lungs every 6 (six) hours as needed. For shortness of breath      . ALPRAZolam (XANAX) 0.25 MG tablet Take 0.25 mg by mouth 3 (three) times daily as needed.       Marland Kitchen amLODipine  (NORVASC) 5 MG tablet Take 1 tablet (5 mg total) by mouth daily.  30 tablet  1  . aspirin EC 81 MG tablet Take 81 mg by mouth daily.      . Cholecalciferol (VITAMIN D) 2000 UNITS tablet Take 2,000 Units by mouth 2 (two) times daily.      Marland Kitchen dexamethasone (DECADRON) 4 MG tablet Take 1 tablet (4 mg total) by mouth 2 (two) times daily with a meal.  40 tablet  0  . Fluticasone-Salmeterol (ADVAIR) 250-50 MCG/DOSE AEPB Inhale 1 puff into the lungs every 12 (twelve) hours.      . folic acid (FOLVITE) 1 MG tablet Take 1 tablet (1 mg total) by mouth daily.  30 tablet  3  . fosinopril (MONOPRIL) 20 MG tablet Take 20 mg by mouth daily.      Marland Kitchen levothyroxine (SYNTHROID, LEVOTHROID) 50 MCG tablet Take 50 mcg by mouth daily.      Marland Kitchen LORazepam (ATIVAN) 0.5 MG tablet TAKE 1 TABLET BY MOUTH OR PLACE 1 TABLET UNDER THE TONGUE EVERY 6 HOURS AS NEEDED FOR NAUSEA  30 tablet  0  . metoprolol tartrate (LOPRESSOR) 25 MG tablet Take 1 tablet (25 mg total) by mouth 2 (two) times daily.  60 tablet  1  . montelukast (SINGULAIR) 10 MG tablet Take 10 mg by mouth at bedtime.      . ondansetron (ZOFRAN-ODT) 8 MG disintegrating tablet Take 8 mg by mouth every 8 (eight) hours as needed for nausea.      Marland Kitchen  Probiotic Product (ALIGN PO) Take 1 capsule by mouth at bedtime.       . prochlorperazine (COMPAZINE) 10 MG tablet Take 1 tablet (10 mg total) by mouth every 6 (six) hours as needed.  60 tablet  0  . prochlorperazine (COMPAZINE) 25 MG suppository Place 25 mg rectally every 12 (twelve) hours as needed for nausea.      . RABEprazole (ACIPHEX) 20 MG tablet 1 tablet daily.      Marland Kitchen UNABLE TO FIND OTC "gavascon" for acid reflux 2 tsp with a meal prn       No current facility-administered medications for this visit.    SURGICAL HISTORY:  Past Surgical History  Procedure Laterality Date  . Cardiac catherization    . Breast lumpectomy    . Tubal ligation    . Video assisted thoracoscopy (vats)/wedge resection Left 11/19/2012     Procedure: VIDEO ASSISTED THORACOSCOPY (VATS)/WEDGE RESECTION;  Surgeon: Loreli Slot, MD;  Location: [facility] OR;  Service: Thoracic;  Laterality: Left;  left upper lobe wedge resection  . Lobectomy Left 11/19/2012    Procedure: LOBECTOMY;  Surgeon: Loreli Slot, MD;  Location: [facility] OR;  Service: Thoracic;  Laterality: Left;  left upper lobe  . Lymph node dissection Left 11/19/2012    Procedure: LYMPH NODE DISSECTION;  Surgeon: Loreli Slot, MD;  Location: [facility] OR;  Service: Thoracic;  Laterality: Left;    REVIEW OF SYSTEMS:  A comprehensive review of systems was negative except for: Gastrointestinal: positive for nausea   PHYSICAL EXAMINATION: General appearance: alert, cooperative and no distress Head: Normocephalic, without obvious abnormality, atraumatic Neck: no adenopathy Lymph nodes: Cervical, supraclavicular, and axillary nodes normal. Resp: clear to auscultation bilaterally Cardio: regular rate and rhythm, S1, S2 normal, no murmur, click, rub or gallop GI: soft, non-tender; bowel sounds normal; no masses,  no organomegaly Extremities: extremities normal, atraumatic, no cyanosis or edema  ECOG PERFORMANCE STATUS: 1 - Symptomatic but completely ambulatory  Blood pressure 159/80, pulse 63, temperature 97.4 F (36.3 C), temperature source Oral, resp. rate 18, height 5\' 6"  (1.676 m), weight 128 lb 9.6 oz (58.333 kg).  LABORATORY DATA: Lab Results  Component Value Date   WBC 5.3 03/02/2013   HGB 11.7 03/02/2013   HCT 33.1* 03/02/2013   MCV 88.7 03/02/2013   PLT 256 03/02/2013      Chemistry      Component Value Date/Time   NA 136 02/23/2013 1010   NA 140 11/21/2012 0445   K 3.4* 02/23/2013 1010   K 3.8 11/21/2012 0445   CL 100 02/23/2013 1010   CL 105 11/21/2012 0445   CO2 25 02/23/2013 1010   CO2 27 11/21/2012 0445   BUN 9.8 02/23/2013 1010   BUN 6 11/21/2012 0445   CREATININE 0.9 02/23/2013 1010   CREATININE 0.55 11/21/2012 0445      Component Value Date/Time   CALCIUM 9.5  02/23/2013 1010   CALCIUM 9.3 11/21/2012 0445   ALKPHOS 69 02/23/2013 1010   ALKPHOS 65 11/21/2012 0445   AST 16 02/23/2013 1010   AST 15 11/21/2012 0445   ALT 18 02/23/2013 1010   ALT 16 11/21/2012 0445   BILITOT 0.33 02/23/2013 1010   BILITOT 0.4 11/21/2012 0445       RADIOGRAPHIC STUDIES: No results found.  ASSESSMENT: This is a very pleasant 67 years old white female with history of stage II a non-small cell lung cancer status post left upper lobectomy and currently undergoing adjuvant chemotherapy with cisplatin and  Alimta status post 3 cycles.   PLAN: The patient is doing fine today. We'll proceed with cycle #4 today as scheduled. The patient would come back for followup visit in one month with repeat CT scan of the chest for evaluation of her disease.  She was advised to call immediately if she has any concerning symptoms in the interval.  All questions were answered. The patient knows to call the clinic with any problems, questions or concerns. We can certainly see the patient much sooner if necessary.

## 2013-03-02 NOTE — Patient Instructions (Signed)
Continue with the last cycle of adjuvant chemotherapy today as scheduled.  Followup in one month with repeat CT scan of the chest.

## 2013-03-02 NOTE — Patient Instructions (Addendum)
[facility] Discharge Instructions for Patients Receiving Chemotherapy  Today you received the following chemotherapy agents Alimta and Cisplatin.  To help prevent nausea and vomiting after your treatment, we encourage you to take your nausea medication as prescribed.   If you develop nausea and vomiting that is not controlled by your nausea medication, call the clinic. If it is after clinic hours your family physician or the after hours number for the clinic or go to the Emergency Department.   BELOW ARE SYMPTOMS THAT SHOULD BE REPORTED IMMEDIATELY:  *FEVER GREATER THAN 100.5 F  *CHILLS WITH OR WITHOUT FEVER  NAUSEA AND VOMITING THAT IS NOT CONTROLLED WITH YOUR NAUSEA MEDICATION  *UNUSUAL SHORTNESS OF BREATH  *UNUSUAL BRUISING OR BLEEDING  TENDERNESS IN MOUTH AND THROAT WITH OR WITHOUT PRESENCE OF ULCERS  *URINARY PROBLEMS  *BOWEL PROBLEMS  UNUSUAL RASH Items with * indicate a potential emergency and should be followed up as soon as possible.  One of the nurses will contact you 24 hours after your treatment. Please let the nurse know about any problems that you may have experienced. Feel free to call the clinic you have any questions or concerns. The clinic phone number is [phone].

## 2013-03-03 ENCOUNTER — Other Ambulatory Visit: Payer: Self-pay | Admitting: *Deleted

## 2013-03-09 ENCOUNTER — Other Ambulatory Visit (HOSPITAL_BASED_OUTPATIENT_CLINIC_OR_DEPARTMENT_OTHER): Payer: Medicare Other

## 2013-03-09 ENCOUNTER — Ambulatory Visit (INDEPENDENT_AMBULATORY_CARE_PROVIDER_SITE_OTHER): Payer: Medicare Other | Admitting: Thoracic Surgery (Cardiothoracic Vascular Surgery)

## 2013-03-09 ENCOUNTER — Encounter: Payer: Self-pay | Admitting: Thoracic Surgery (Cardiothoracic Vascular Surgery)

## 2013-03-09 ENCOUNTER — Ambulatory Visit
Admission: RE | Admit: 2013-03-09 | Discharge: 2013-03-09 | Disposition: A | Discharge status: Home / Self Care | Payer: Medicare Other | Source: Ambulatory Visit | Attending: Thoracic Surgery (Cardiothoracic Vascular Surgery) | Admitting: Thoracic Surgery (Cardiothoracic Vascular Surgery)

## 2013-03-09 VITALS — BP 137/81 | HR 82 | Resp 20 | Ht 66.0 in | Wt 128.0 lb

## 2013-03-09 DIAGNOSIS — C3410 Malignant neoplasm of upper lobe, unspecified bronchus or lung: Secondary | ICD-10-CM

## 2013-03-09 DIAGNOSIS — Z902 Acquired absence of lung [part of]: Secondary | ICD-10-CM

## 2013-03-09 DIAGNOSIS — C7A1 Malignant poorly differentiated neuroendocrine tumors: Secondary | ICD-10-CM

## 2013-03-09 DIAGNOSIS — Z9889 Other specified postprocedural states: Secondary | ICD-10-CM

## 2013-03-09 LAB — COMPREHENSIVE METABOLIC PANEL (CC13)
AST: 15 U/L (ref 5–34)
Albumin: 2.9 g/dL — ABNORMAL LOW (ref 3.5–5.0)
Alkaline Phosphatase: 77 U/L (ref 40–150)
Potassium: 3.3 mEq/L — ABNORMAL LOW (ref 3.5–5.1)
Sodium: 135 mEq/L — ABNORMAL LOW (ref 136–145)
Total Bilirubin: 0.54 mg/dL (ref 0.20–1.20)
Total Protein: 6.2 g/dL — ABNORMAL LOW (ref 6.4–8.3)

## 2013-03-09 LAB — CBC WITH DIFFERENTIAL/PLATELET
Basophils Absolute: 0 10*3/uL (ref 0.0–0.1)
EOS%: 2.4 % (ref 0.0–7.0)
Eosinophils Absolute: 0.1 10*3/uL (ref 0.0–0.5)
HCT: 31.5 % — ABNORMAL LOW (ref 34.8–46.6)
HGB: 11.3 g/dL — ABNORMAL LOW (ref 11.6–15.9)
LYMPH%: 41.6 % (ref 14.0–49.7)
MCH: 32.2 pg (ref 25.1–34.0)
MCV: 90.1 fL (ref 79.5–101.0)
MONO%: 2 % (ref 0.0–14.0)
NEUT#: 1.6 10*3/uL (ref 1.5–6.5)
NEUT%: 53.7 % (ref 38.4–76.8)
Platelets: 129 10*3/uL — ABNORMAL LOW (ref 145–400)

## 2013-03-09 NOTE — Progress Notes (Signed)
HPI:  67 year old woman who underwent a thoracoscopic left upper lobectomy for a stage IIA (T1 B., N1) neuroendocrine carcinoma on February 6. She has just completed her fourth cycle of chemotherapy. She's had a lot of nausea from the chemotherapy. She is not having any incisional pain from her surgery. Her breathing has been stable. She feels well aside from nausea.    Past Medical History  Diagnosis Date  . GERD (gastroesophageal reflux disease)   . Asthma   . Hypertension   . Diabetes mellitus   . Thyroid disease   . Anxiety   . Vertigo   . Breast cancer   . COPD (chronic obstructive pulmonary disease)   . PONV (postoperative nausea and vomiting)      Current Outpatient Prescriptions  Medication Sig Dispense Refill  . albuterol (ACCUNEB) 1.25 MG/3ML nebulizer solution Take 1 ampule by nebulization 2 (two) times daily as needed. For shortness of breath      . albuterol (PROVENTIL HFA;VENTOLIN HFA) 108 (90 BASE) MCG/ACT inhaler Inhale 2 puffs into the lungs every 6 (six) hours as needed. For shortness of breath      . ALPRAZolam (XANAX) 0.25 MG tablet Take 0.25 mg by mouth 3 (three) times daily as needed.       Marland Kitchen amLODipine (NORVASC) 5 MG tablet Take 1 tablet (5 mg total) by mouth daily.  30 tablet  1  . aspirin EC 81 MG tablet Take 81 mg by mouth daily.      . Cholecalciferol (VITAMIN D) 2000 UNITS tablet Take 2,000 Units by mouth 2 (two) times daily.      Marland Kitchen dexamethasone (DECADRON) 4 MG tablet Take 1 tablet (4 mg total) by mouth 2 (two) times daily with a meal.  40 tablet  0  . Fluticasone-Salmeterol (ADVAIR) 250-50 MCG/DOSE AEPB Inhale 1 puff into the lungs every 12 (twelve) hours.      . folic acid (FOLVITE) 1 MG tablet Take 1 tablet (1 mg total) by mouth daily.  30 tablet  3  . fosinopril (MONOPRIL) 20 MG tablet Take 20 mg by mouth daily.      Marland Kitchen levothyroxine (SYNTHROID, LEVOTHROID) 50 MCG tablet Take 50 mcg by mouth daily.      Marland Kitchen LORazepam (ATIVAN) 0.5 MG tablet TAKE 1  TABLET BY MOUTH OR PLACE 1 TABLET UNDER THE TONGUE EVERY 6 HOURS AS NEEDED FOR NAUSEA  30 tablet  0  . metoprolol tartrate (LOPRESSOR) 25 MG tablet Take 1 tablet (25 mg total) by mouth 2 (two) times daily.  60 tablet  1  . montelukast (SINGULAIR) 10 MG tablet Take 10 mg by mouth at bedtime.      . ondansetron (ZOFRAN-ODT) 8 MG disintegrating tablet Take 8 mg by mouth every 8 (eight) hours as needed for nausea.      . Probiotic Product (ALIGN PO) Take 1 capsule by mouth at bedtime.       . prochlorperazine (COMPAZINE) 10 MG tablet Take 1 tablet (10 mg total) by mouth every 6 (six) hours as needed.  60 tablet  0  . prochlorperazine (COMPAZINE) 25 MG suppository Place 25 mg rectally every 12 (twelve) hours as needed for nausea.      . RABEprazole (ACIPHEX) 20 MG tablet 1 tablet daily.      Marland Kitchen UNABLE TO FIND OTC "gavascon" for acid reflux 2 tsp with a meal prn       No current facility-administered medications for this visit.    Physical Exam BP 137/81  Pulse 82  Resp 20  Ht 5\' 6"  (1.676 m)  Wt 128 lb (58.06 kg)  BMI 20.67 kg/m2  SpO23 3% 67 year old woman in no acute distress Well-developed well-nourished Lungs slightly diminished at left base otherwise clear Incisions well healed No cervical or subclavicular adenopathy  Diagnostic Tests: Chest x-ray 03/09/2013 CHEST - 2 VIEW  Comparison: 12/08/2012  Findings: Postsurgical changes on the left. Scarring of the left  base. No acute airspace opacity. No effusion or acute bony  abnormality. Heart is normal size.  IMPRESSION:  Postsurgical changes on the left. No acute findings.    Impression: 67 year old woman now 3 months out from a left upper lobectomy for a stage IIA neuroendocrine tumor. She has just completed her fourth cycle of chemotherapy. She's doing well and has no evidence of recurrent disease at the present time. She has an appointment for CT scan in June and a scheduled followup with Dr. Arbutus Ped at that time. I will plan  to see her back in September.   Plan: Return in September with PA and lateral chest x-ray.

## 2013-03-26 ENCOUNTER — Ambulatory Visit (HOSPITAL_COMMUNITY)
Admission: RE | Admit: 2013-03-26 | Discharge: 2013-03-26 | Disposition: A | Discharge status: Home / Self Care | Payer: Medicare Other | Source: Ambulatory Visit | Attending: Internal Medicine | Admitting: Internal Medicine

## 2013-03-26 DIAGNOSIS — E279 Disorder of adrenal gland, unspecified: Secondary | ICD-10-CM | POA: Insufficient documentation

## 2013-03-26 DIAGNOSIS — J90 Pleural effusion, not elsewhere classified: Secondary | ICD-10-CM | POA: Insufficient documentation

## 2013-03-26 DIAGNOSIS — C3410 Malignant neoplasm of upper lobe, unspecified bronchus or lung: Secondary | ICD-10-CM | POA: Insufficient documentation

## 2013-03-26 DIAGNOSIS — Z902 Acquired absence of lung [part of]: Secondary | ICD-10-CM | POA: Insufficient documentation

## 2013-03-26 MED ORDER — IOHEXOL 300 MG/ML  SOLN
80.0000 mL | Freq: Once | INTRAMUSCULAR | Status: AC | PRN
  Administered 2013-03-26: 80 mL via INTRAVENOUS

## 2013-03-30 ENCOUNTER — Ambulatory Visit (HOSPITAL_BASED_OUTPATIENT_CLINIC_OR_DEPARTMENT_OTHER): Payer: Medicare Other | Admitting: Internal Medicine

## 2013-03-30 ENCOUNTER — Encounter: Payer: Self-pay | Admitting: Internal Medicine

## 2013-03-30 ENCOUNTER — Telehealth: Payer: Self-pay | Admitting: Internal Medicine

## 2013-03-30 ENCOUNTER — Other Ambulatory Visit (HOSPITAL_BASED_OUTPATIENT_CLINIC_OR_DEPARTMENT_OTHER): Payer: Medicare Other | Admitting: Lab

## 2013-03-30 DIAGNOSIS — C3410 Malignant neoplasm of upper lobe, unspecified bronchus or lung: Secondary | ICD-10-CM

## 2013-03-30 LAB — COMPREHENSIVE METABOLIC PANEL (CC13)
AST: 15 U/L (ref 5–34)
Albumin: 3.1 g/dL — ABNORMAL LOW (ref 3.5–5.0)
Alkaline Phosphatase: 72 U/L (ref 40–150)
Potassium: 4.1 mEq/L (ref 3.5–5.1)
Sodium: 137 mEq/L (ref 136–145)
Total Protein: 6.7 g/dL (ref 6.4–8.3)

## 2013-03-30 LAB — CBC WITH DIFFERENTIAL/PLATELET
Eosinophils Absolute: 0 10*3/uL (ref 0.0–0.5)
HCT: 30.9 % — ABNORMAL LOW (ref 34.8–46.6)
LYMPH%: 22.2 % (ref 14.0–49.7)
MCHC: 35.8 g/dL (ref 31.5–36.0)
MCV: 92.5 fL (ref 79.5–101.0)
MONO%: 11.9 % (ref 0.0–14.0)
NEUT#: 5 10*3/uL (ref 1.5–6.5)
NEUT%: 64.6 % (ref 38.4–76.8)
Platelets: 238 10*3/uL (ref 145–400)
RBC: 3.34 10*6/uL — ABNORMAL LOW (ref 3.70–5.45)

## 2013-03-30 NOTE — Patient Instructions (Addendum)
No evidence for disease recurrence on the recent scan. Followup visit in 3 months with repeat CT scan of the chest. 

## 2013-03-30 NOTE — Progress Notes (Signed)
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   PRIOR THERAPY:  1) Status post left upper lobectomy with lymph node dissection. 2) Adjuvant systemic chemotherapy with cisplatin 75 mg region squared and Alimta 500 mg per meter squared given every 3 weeks for total of 4 cycles, status post 4 cycles.  CURRENT THERAPY: Observation.    INTERVAL HISTORY: Sara Mcclure 67 y.o. female returns to the clinic today for followup visit accompanied by her husband. The patient is feeling fine today with no specific complaints except for mild fatigue. She continues to have occasional nausea but relieved with Zofran. She denied having any significant chest pain, shortness breath, cough or hemoptysis. The patient denied having any significant weight loss or night sweats. She tolerated the 4 cycles of her adjuvant chemotherapy with cisplatin and Alimta fairly well. She had repeat CT scan of the chest performed recently and she is here for evaluation and discussion of her scan results.  MEDICAL HISTORY: Past Medical History  Diagnosis Date  . GERD (gastroesophageal reflux disease)   . Asthma   . Hypertension   . Diabetes mellitus   . Thyroid disease   . Anxiety   . Vertigo   . Breast cancer   . COPD (chronic obstructive pulmonary disease)   . PONV (postoperative nausea and vomiting)     ALLERGIES:  is allergic to metformin and related.  MEDICATIONS:  Current Outpatient Prescriptions  Medication Sig Dispense Refill  . albuterol (ACCUNEB) 1.25 MG/3ML nebulizer solution Take 1 ampule by nebulization 2 (two) times daily as needed. For shortness of breath      . albuterol (PROVENTIL  HFA;VENTOLIN HFA) 108 (90 BASE) MCG/ACT inhaler Inhale 2 puffs into the lungs every 6 (six) hours as needed. For shortness of breath      . ALPRAZolam (XANAX) 0.25 MG tablet Take 0.25 mg by mouth 3 (three) times daily as needed.       Marland Kitchen amLODipine (NORVASC) 5 MG tablet Take 1 tablet (5 mg total) by mouth daily.  30 tablet  1  . aspirin EC 81 MG tablet Take 81 mg by mouth daily.      . Cholecalciferol (VITAMIN D) 2000 UNITS tablet Take 2,000 Units by mouth 2 (two) times daily.      . Fluticasone-Salmeterol (ADVAIR) 250-50 MCG/DOSE AEPB Inhale 1 puff into the lungs every 12 (twelve) hours.      . folic acid (FOLVITE) 1 MG tablet Take 1 tablet (1 mg total) by mouth daily.  30 tablet  3  . fosinopril (MONOPRIL) 20 MG tablet Take 20 mg by mouth daily.      Marland Kitchen levothyroxine (SYNTHROID, LEVOTHROID) 50 MCG tablet Take 50 mcg by mouth daily.      Marland Kitchen LORazepam (ATIVAN) 0.5 MG tablet TAKE 1 TABLET BY MOUTH OR PLACE 1 TABLET UNDER THE TONGUE EVERY 6 HOURS AS NEEDED FOR NAUSEA  30 tablet  0  . metoprolol tartrate (LOPRESSOR) 25 MG tablet Take 1 tablet (25 mg total) by mouth 2 (two) times daily.  60 tablet  1  . montelukast (SINGULAIR) 10 MG tablet Take 10 mg by mouth at bedtime.      . ondansetron (ZOFRAN-ODT) 8 MG disintegrating tablet Take 8 mg  by mouth every 8 (eight) hours as needed for nausea.      . Probiotic Product (ALIGN PO) Take 1 capsule by mouth at bedtime.       . prochlorperazine (COMPAZINE) 10 MG tablet Take 1 tablet (10 mg total) by mouth every 6 (six) hours as needed.  60 tablet  0  . prochlorperazine (COMPAZINE) 25 MG suppository Place 25 mg rectally every 12 (twelve) hours as needed for nausea.      . RABEprazole (ACIPHEX) 20 MG tablet 1 tablet daily.      Marland Kitchen UNABLE TO FIND OTC "gavascon" for acid reflux 2 tsp with a meal prn       No current facility-administered medications for this visit.    SURGICAL HISTORY:  Past Surgical History  Procedure Laterality Date  . Cardiac catherization      . Breast lumpectomy    . Tubal ligation    . Video assisted thoracoscopy (vats)/wedge resection Left 11/19/2012    Procedure: VIDEO ASSISTED THORACOSCOPY (VATS)/WEDGE RESECTION;  Surgeon: Loreli Slot, MD;  Location: [facility] OR;  Service: Thoracic;  Laterality: Left;  left upper lobe wedge resection  . Lobectomy Left 11/19/2012    Procedure: LOBECTOMY;  Surgeon: Loreli Slot, MD;  Location: [facility] OR;  Service: Thoracic;  Laterality: Left;  left upper lobe  . Lymph node dissection Left 11/19/2012    Procedure: LYMPH NODE DISSECTION;  Surgeon: Loreli Slot, MD;  Location: [facility] OR;  Service: Thoracic;  Laterality: Left;    REVIEW OF SYSTEMS:  A comprehensive review of systems was negative except for: Gastrointestinal: positive for nausea   PHYSICAL EXAMINATION: General appearance: alert, cooperative and no distress Head: Normocephalic, without obvious abnormality, atraumatic Neck: no adenopathy Lymph nodes: Cervical, supraclavicular, and axillary nodes normal. Resp: clear to auscultation bilaterally Cardio: regular rate and rhythm, S1, S2 normal, no murmur, click, rub or gallop GI: soft, non-tender; bowel sounds normal; no masses,  no organomegaly Extremities: extremities normal, atraumatic, no cyanosis or edema Neurologic: Alert and oriented X 3, normal strength and tone. Normal symmetric reflexes. Normal coordination and gait  ECOG PERFORMANCE STATUS: 1 - Symptomatic but completely ambulatory  Blood pressure 153/81, pulse 73, temperature 98.5 F (36.9 C), temperature source Oral, resp. rate 20, height 5\' 6"  (1.676 m), weight 126 lb 1.6 oz (57.199 kg).  LABORATORY DATA: Lab Results  Component Value Date   WBC 7.7 03/30/2013   HGB 11.1* 03/30/2013   HCT 30.9* 03/30/2013   MCV 92.5 03/30/2013   PLT 238 03/30/2013      Chemistry      Component Value Date/Time   NA 135* 03/09/2013 0956   NA 140 11/21/2012 0445   K 3.3* 03/09/2013 0956   K 3.8 11/21/2012 0445   CL 99 03/09/2013  0956   CL 105 11/21/2012 0445   CO2 26 03/09/2013 0956   CO2 27 11/21/2012 0445   BUN 19.9 03/09/2013 0956   BUN 6 11/21/2012 0445   CREATININE 1.1 03/09/2013 0956   CREATININE 0.55 11/21/2012 0445      Component Value Date/Time   CALCIUM 9.2 03/09/2013 0956   CALCIUM 9.3 11/21/2012 0445   ALKPHOS 77 03/09/2013 0956   ALKPHOS 65 11/21/2012 0445   AST 15 03/09/2013 0956   AST 15 11/21/2012 0445   ALT 15 03/09/2013 0956   ALT 16 11/21/2012 0445   BILITOT 0.54 03/09/2013 0956   BILITOT 0.4 11/21/2012 0445       RADIOGRAPHIC STUDIES: Dg Chest 2 View  03/09/2013   *RADIOLOGY REPORT*  Clinical Data: Follow-up lung cancer.  CHEST - 2 VIEW  Comparison: 12/08/2012  Findings: Postsurgical changes on the left.  Scarring of the left base.  No acute airspace opacity.  No effusion or acute bony abnormality.  Heart is normal size.  IMPRESSION: Postsurgical changes on the left.  No acute findings.   Original Report Authenticated By: Charlett Nose, M.D.   Ct Chest W Contrast  03/26/2013   *RADIOLOGY REPORT*  Clinical Data: Lung cancer  CT CHEST WITH CONTRAST  Technique:  Multidetector CT imaging of the chest was performed following the standard protocol during bolus administration of intravenous contrast.  Contrast: 80mL OMNIPAQUE IOHEXOL 300 MG/ML  SOLN  Comparison: 11/03/2012  Findings:  There is a small to moderate left pleural effusion.  Postop change and volume loss from left upper lobectomy noted.  No specific features identified to suggest local tumor recurrence within the left lung.  Multiple tiny nodules are again seen scattered throughout the lungs.  Right upper lobe 3 mm nodule is identified, image 20/series 5.  This is unchanged from previous exam.  Normal heart size.  No pericardial effusion.  No mediastinal or hilar adenopathy.    No enlarged axillary or supraclavicular lymph nodes.  Surgical clips in the left breast are again noted.  Imaging through the upper abdomen shows no acute findings.  Nodule in the left  adrenal gland measures 1.2 cm and is unchanged from previous study.  Review of the visualized bony structures shows no aggressive lytic or sclerotic bone lesions.  IMPRESSION:  1.  No acute findings. 2.  Status post left upper lobectomy. 3.  Left pleural effusion. 4.  Tiny nodules are unchanged from previous exam but warrants attention on follow-up imaging.   Original Report Authenticated By: Signa Kell, M.D.    ASSESSMENT AND PLAN: This is a very pleasant 67 years old white female with stage II a non-small cell lung cancer status post left upper lobectomy with lymph node dissection followed by 4 cycles of adjuvant chemotherapy with cisplatin and Alimta completed recently. The patient is doing fine and tolerated her last cycle of the chemotherapy well except for mild fatigue and nausea. She had repeat CT scan of the chest that showed no evidence for disease recurrence. I discussed the scan results with the patient and her husband.  I recommended for her to continue on observation for now with repeat CT scan of the chest in 3 months.  The patient was advised to call immediately if she has any concerning symptoms in the interval. The patient had several questions and I answered them completely to her satisfaction.  All questions were answered. The patient knows to call the clinic with any problems, questions or concerns. We can certainly see the patient much sooner if necessary.  I spent 15 minutes counseling the patient face to face. The total time spent in the appointment was 25 minutes.

## 2013-04-01 ENCOUNTER — Telehealth: Payer: Self-pay | Admitting: *Deleted

## 2013-04-01 ENCOUNTER — Other Ambulatory Visit: Payer: Self-pay | Admitting: Internal Medicine

## 2013-04-01 NOTE — Telephone Encounter (Signed)
SPOKE WITH PT. CONCERNING HER PROBLEM WITH NAUSEA AND OCCASIONAL VOMITING. PT. WOULD LIKE TO STOP THE LORAZEPAM AND RESTART HER XANAX. VERBAL ORDER AND READ BACK TO DR.MOHAMED- PT. MAY STOP THE LORAZEPAM AND RESTART HER XANAX. NOTIFIED PT. OF THE ABOVE INSTRUCTION. SHE WILL CALL WITH AN UPDATE ON HER CONDITION IN A FEW WEEKS.

## 2013-04-06 ENCOUNTER — Other Ambulatory Visit: Payer: Self-pay | Admitting: Internal Medicine

## 2013-04-09 ENCOUNTER — Telehealth: Payer: Self-pay | Admitting: Medical Oncology

## 2013-04-09 NOTE — Telephone Encounter (Signed)
Called to report she feels better with zofran. Not using ativan.

## 2013-06-08 ENCOUNTER — Ambulatory Visit: Payer: Medicare Other | Admitting: Thoracic Surgery (Cardiothoracic Vascular Surgery)

## 2013-06-29 ENCOUNTER — Other Ambulatory Visit (HOSPITAL_BASED_OUTPATIENT_CLINIC_OR_DEPARTMENT_OTHER): Payer: Medicare Other | Admitting: Lab

## 2013-06-29 ENCOUNTER — Ambulatory Visit (HOSPITAL_COMMUNITY)
Admission: RE | Admit: 2013-06-29 | Discharge: 2013-06-29 | Disposition: A | Discharge status: Home / Self Care | Payer: Medicare Other | Source: Ambulatory Visit | Attending: Internal Medicine | Admitting: Internal Medicine

## 2013-06-29 DIAGNOSIS — D3500 Benign neoplasm of unspecified adrenal gland: Secondary | ICD-10-CM | POA: Insufficient documentation

## 2013-06-29 DIAGNOSIS — C787 Secondary malignant neoplasm of liver and intrahepatic bile duct: Secondary | ICD-10-CM | POA: Insufficient documentation

## 2013-06-29 DIAGNOSIS — C3490 Malignant neoplasm of unspecified part of unspecified bronchus or lung: Secondary | ICD-10-CM | POA: Insufficient documentation

## 2013-06-29 DIAGNOSIS — I700 Atherosclerosis of aorta: Secondary | ICD-10-CM | POA: Insufficient documentation

## 2013-06-29 DIAGNOSIS — Z9221 Personal history of antineoplastic chemotherapy: Secondary | ICD-10-CM | POA: Insufficient documentation

## 2013-06-29 DIAGNOSIS — J90 Pleural effusion, not elsewhere classified: Secondary | ICD-10-CM | POA: Insufficient documentation

## 2013-06-29 DIAGNOSIS — Z853 Personal history of malignant neoplasm of breast: Secondary | ICD-10-CM | POA: Insufficient documentation

## 2013-06-29 DIAGNOSIS — C3410 Malignant neoplasm of upper lobe, unspecified bronchus or lung: Secondary | ICD-10-CM

## 2013-06-29 DIAGNOSIS — J984 Other disorders of lung: Secondary | ICD-10-CM | POA: Insufficient documentation

## 2013-06-29 DIAGNOSIS — M5124 Other intervertebral disc displacement, thoracic region: Secondary | ICD-10-CM | POA: Insufficient documentation

## 2013-06-29 LAB — COMPREHENSIVE METABOLIC PANEL (CC13)
AST: 22 U/L (ref 5–34)
Albumin: 3.3 g/dL — ABNORMAL LOW (ref 3.5–5.0)
Alkaline Phosphatase: 68 U/L (ref 40–150)
BUN: 12 mg/dL (ref 7.0–26.0)
Potassium: 4.3 mEq/L (ref 3.5–5.1)
Sodium: 137 mEq/L (ref 136–145)
Total Protein: 6.8 g/dL (ref 6.4–8.3)

## 2013-06-29 LAB — CBC WITH DIFFERENTIAL/PLATELET
EOS%: 3.2 % (ref 0.0–7.0)
MCH: 31.5 pg (ref 25.1–34.0)
MCHC: 34.2 g/dL (ref 31.5–36.0)
MCV: 92.3 fL (ref 79.5–101.0)
MONO%: 8.2 % (ref 0.0–14.0)
RBC: 3.91 10*6/uL (ref 3.70–5.45)
RDW: 13 % (ref 11.2–14.5)

## 2013-06-29 MED ORDER — IOHEXOL 300 MG/ML  SOLN
80.0000 mL | Freq: Once | INTRAMUSCULAR | Status: AC | PRN
  Administered 2013-06-29: 80 mL via INTRAVENOUS

## 2013-07-01 ENCOUNTER — Ambulatory Visit (HOSPITAL_BASED_OUTPATIENT_CLINIC_OR_DEPARTMENT_OTHER): Payer: Medicare Other | Admitting: Internal Medicine

## 2013-07-01 ENCOUNTER — Encounter: Payer: Self-pay | Admitting: Internal Medicine

## 2013-07-01 ENCOUNTER — Telehealth: Payer: Self-pay | Admitting: Internal Medicine

## 2013-07-01 DIAGNOSIS — C3410 Malignant neoplasm of upper lobe, unspecified bronchus or lung: Secondary | ICD-10-CM

## 2013-07-01 DIAGNOSIS — C787 Secondary malignant neoplasm of liver and intrahepatic bile duct: Secondary | ICD-10-CM

## 2013-07-01 NOTE — Telephone Encounter (Signed)
Gave pt appt for Md and MRI this September, MRI was moved from [facility] to 301 location due to pt being very claustraphobic, MD aware

## 2013-07-01 NOTE — Progress Notes (Signed)
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   PRIOR THERAPY:  1) Status post left upper lobectomy with lymph node dissection.  2) Adjuvant systemic chemotherapy with cisplatin 75 mg region squared and Alimta 500 mg per meter squared given every 3 weeks for total of 4 cycles, status post 4 cycles. Last dose was given 03/02/2013.  CURRENT THERAPY: Observation.   INTERVAL HISTORY: Sara Mcclure 67 y.o. female returns to the clinic today for routine three-month follow up visit and discussion of her scan results. The patient is feeling fine today with no specific complaints except for mild fatigue. She denied having any significant chest pain, shortness breath, cough or hemoptysis. The patient denied having any nausea or vomiting or abdominal pain. She has no weight loss or night sweats. She had repeat CT scan of the chest performed recently and she is here for evaluation and discussion of her scan results.  MEDICAL HISTORY: Past Medical History  Diagnosis Date  . GERD (gastroesophageal reflux disease)   . Asthma   . Hypertension   . Diabetes mellitus   . Thyroid disease   . Anxiety   . Vertigo   . Breast cancer   . COPD (chronic obstructive pulmonary disease)   . PONV (postoperative nausea and vomiting)     ALLERGIES:  is allergic to metformin and related.  MEDICATIONS:  Current Outpatient Prescriptions  Medication Sig Dispense Refill  . albuterol (ACCUNEB) 1.25 MG/3ML nebulizer solution Take 1 ampule by nebulization 2 (two) times daily as needed. For shortness of breath      . ALPRAZolam (XANAX)  0.25 MG tablet Take 0.25 mg by mouth 3 (three) times daily as needed.       Marland Kitchen amLODipine (NORVASC) 5 MG tablet Take 1 tablet (5 mg total) by mouth daily.  30 tablet  1  . aspirin EC 81 MG tablet Take 81 mg by mouth daily.      . Cholecalciferol (VITAMIN D) 2000 UNITS tablet Take 2,000 Units by mouth 2 (two) times daily.      . folic acid (FOLVITE) 1 MG tablet Take 1 tablet (1 mg total) by mouth daily.  30 tablet  3  . fosinopril (MONOPRIL) 20 MG tablet Take 20 mg by mouth daily.      Marland Kitchen levothyroxine (SYNTHROID, LEVOTHROID) 50 MCG tablet Take 50 mcg by mouth daily.      . metoprolol tartrate (LOPRESSOR) 25 MG tablet Take 1 tablet (25 mg total) by mouth 2 (two) times daily.  60 tablet  1  . montelukast (SINGULAIR) 10 MG tablet Take 10 mg by mouth at bedtime.      . Probiotic Product (ALIGN PO) Take 1 capsule by mouth at bedtime.       . RABEprazole (ACIPHEX) 20 MG tablet 1 tablet daily.      Marland Kitchen albuterol (PROVENTIL HFA;VENTOLIN HFA) 108 (90 BASE) MCG/ACT inhaler Inhale 2 puffs into the lungs every 6 (six) hours as needed. For shortness of breath      . Fluticasone-Salmeterol (ADVAIR) 250-50 MCG/DOSE AEPB Inhale 1 puff into the lungs every 12 (twelve) hours.      Marland Kitchen LORazepam (ATIVAN) 0.5 MG tablet TAKE  1 TABLET BY MOUTH OR PLACE 1 TABLET UNDER THE TONGUE EVERY 6 HOURS AS NEEDED FOR NAUSEA  30 tablet  0  . ondansetron (ZOFRAN-ODT) 8 MG disintegrating tablet LET 1 TABLET DISSOLVE IN MOUTH AS DIRECTED EVERY 8 HOURS AS NEEDED FOR NAUSEA AND VOMITING  20 tablet  3  . prochlorperazine (COMPAZINE) 10 MG tablet Take 1 tablet (10 mg total) by mouth every 6 (six) hours as needed.  60 tablet  0  . UNABLE TO FIND OTC "gavascon" for acid reflux 2 tsp with a meal prn       No current facility-administered medications for this visit.    SURGICAL HISTORY:  Past Surgical History  Procedure Laterality Date  . Cardiac catherization    . Breast lumpectomy    . Tubal ligation    . Video assisted thoracoscopy  (vats)/wedge resection Left 11/19/2012    Procedure: VIDEO ASSISTED THORACOSCOPY (VATS)/WEDGE RESECTION;  Surgeon: Loreli Slot, MD;  Location: [facility] OR;  Service: Thoracic;  Laterality: Left;  left upper lobe wedge resection  . Lobectomy Left 11/19/2012    Procedure: LOBECTOMY;  Surgeon: Loreli Slot, MD;  Location: [facility] OR;  Service: Thoracic;  Laterality: Left;  left upper lobe  . Lymph node dissection Left 11/19/2012    Procedure: LYMPH NODE DISSECTION;  Surgeon: Loreli Slot, MD;  Location: [facility] OR;  Service: Thoracic;  Laterality: Left;    REVIEW OF SYSTEMS:  Constitutional: negative Eyes: negative Ears, nose, mouth, throat, and face: negative Respiratory: negative Cardiovascular: negative Gastrointestinal: negative Genitourinary:negative Integument/breast: negative Hematologic/lymphatic: negative Musculoskeletal:negative Neurological: negative Behavioral/Psych: negative Endocrine: negative Allergic/Immunologic: negative   PHYSICAL EXAMINATION: General appearance: alert, cooperative and no distress Head: Normocephalic, without obvious abnormality, atraumatic Neck: no adenopathy, no carotid bruit, supple, symmetrical, trachea midline and thyroid not enlarged, symmetric, no tenderness/mass/nodules Lymph nodes: Cervical, supraclavicular, and axillary nodes normal. Resp: clear to auscultation bilaterally and normal percussion bilaterally Back: symmetric, no curvature. ROM normal. No CVA tenderness. Cardio: regular rate and rhythm, S1, S2 normal, no murmur, click, rub or gallop GI: soft, non-tender; bowel sounds normal; no masses,  no organomegaly Extremities: extremities normal, atraumatic, no cyanosis or edema Neurologic: Alert and oriented X 3, normal strength and tone. Normal symmetric reflexes. Normal coordination and gait  ECOG PERFORMANCE STATUS: 1 - Symptomatic but completely ambulatory  Blood pressure 169/80, pulse 65, temperature 97.6 F (36.4 C),  temperature source Oral, resp. rate 18, height 5\' 6"  (1.676 m), weight 123 lb 14.4 oz (56.201 kg).  LABORATORY DATA: Lab Results  Component Value Date   WBC 7.5 06/29/2013   HGB 12.3 06/29/2013   HCT 36.1 06/29/2013   MCV 92.3 06/29/2013   PLT 189 06/29/2013      Chemistry      Component Value Date/Time   NA 137 06/29/2013 1103   NA 140 11/21/2012 0445   K 4.3 06/29/2013 1103   K 3.8 11/21/2012 0445   CL 102 03/30/2013 0951   CL 105 11/21/2012 0445   CO2 24 06/29/2013 1103   CO2 27 11/21/2012 0445   BUN 12.0 06/29/2013 1103   BUN 6 11/21/2012 0445   CREATININE 0.9 06/29/2013 1103   CREATININE 0.55 11/21/2012 0445      Component Value Date/Time   CALCIUM 10.1 06/29/2013 1103   CALCIUM 9.3 11/21/2012 0445   ALKPHOS 68 06/29/2013 1103   ALKPHOS 65 11/21/2012 0445   AST 22 06/29/2013 1103   AST 15 11/21/2012 0445   ALT 15 06/29/2013 1103  ALT 16 11/21/2012 0445   BILITOT 0.35 06/29/2013 1103   BILITOT 0.4 11/21/2012 0445       RADIOGRAPHIC STUDIES: Ct Chest W Contrast  06/29/2013   CLINICAL DATA:  Lung cancer status post partial lung resection. History of breast cancer with lumpectomy in 1995. Chemotherapy completed 4 months ago.  EXAM: CT CHEST WITH CONTRAST  TECHNIQUE: Multidetector CT imaging of the chest was performed during intravenous contrast administration.  CONTRAST:  80mL OMNIPAQUE IOHEXOL 300 MG/ML  SOLN  COMPARISON:  Chest CT 03/26/2013 and 11/03/2012. PET-CT 11/06/2012.  FINDINGS: There are stable postsurgical findings status post left upper lobe resection. Best seen on the reformatted images is increased soft tissue in the AP window worrisome for local recurrence or AP window adenopathy. This measures 1.8 cm short axis on coronal image 38. No other enlarged mediastinal or hilar lymph nodes are demonstrated.  There is a stable small left pleural effusion without associated pleural nodularity. There is no right pleural effusion or pericardial effusion. There is stable atherosclerosis of the aorta,  great vessels and coronary arteries. There are stable postsurgical changes within the left breast and left axilla.  There are stable emphysematous changes throughout the lungs with mild biapical scarring. No discrete nodules are demonstrated currently.  Images through the upper abdomen demonstrate the interval development of multiple peripherally enhancing liver lesions consistent with metastatic disease. Representative lesions measure 1.8 cm in the right lobe on image 57 and 1.7 cm in the left lobe on image 61. A left adrenal nodule is stable.  There are no worrisome osseous findings. There is a stable central calcified disc protrusion at T6-7.  IMPRESSION: 1. 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  2. Interval development of increased soft tissue in the AP window worrisome for nodal metastasis or local recurrence of lung cancer.  3. No evidence of pulmonary metastasis. Stable chronic lung disease.  4. Stable left adrenal adenoma.   Electronically Signed   By: Roxy Horseman   On: 06/29/2013 13:41    ASSESSMENT AND PLAN: this is a very pleasant 67 years old white female with history of stage IIA non-small cell lung cancer, high grade poorly differentiated neuroendocrine carcinoma, large cell type status post left upper lobectomy with lymph node dissection followed by 4 cycles of adjuvant chemotherapy with cisplatin and Alimta completed in may of 2014. Unfortunately the patient has evidence for disease recurrence with multiple lesions in the liver in addition to the AP window lymphadenopathy. I have a lengthy discussion with the patient today about her current condition and I showed her the scan images. I recommended for the patient to complete the staging workup by ordering a PET scan as well as MRI of the brain. I also referred the patient to interventional radiology for consideration of ultrasound guided core biopsy of one of the liver lesion for tissue diagnosis confirmation. I would  see the patient back for follow up visit in 2 weeks for evaluation and discussion of her biopsy and the scan results as well as treatment options.  The patient voices understanding of current disease status and treatment options and is in agreement with the current care plan.  All questions were answered. The patient knows to call the clinic with any problems, questions or concerns. We can certainly see the patient much sooner if necessary.  I spent 15 minutes counseling the patient face to face. The total time spent in the appointment was 25 minutes.

## 2013-07-03 NOTE — Patient Instructions (Signed)
Follow up visit in 2 weeks after repeating PET scan, MRI of the brain as well as ultrasound guided core biopsy of the liver lesion.

## 2013-07-05 ENCOUNTER — Other Ambulatory Visit: Payer: Self-pay | Admitting: Radiology

## 2013-07-06 ENCOUNTER — Encounter (HOSPITAL_COMMUNITY): Payer: Self-pay | Admitting: Pharmacy Technician

## 2013-07-06 ENCOUNTER — Ambulatory Visit (INDEPENDENT_AMBULATORY_CARE_PROVIDER_SITE_OTHER): Payer: Medicare Other | Admitting: Thoracic Surgery (Cardiothoracic Vascular Surgery)

## 2013-07-06 ENCOUNTER — Encounter: Payer: Self-pay | Admitting: Thoracic Surgery (Cardiothoracic Vascular Surgery)

## 2013-07-06 VITALS — BP 176/85 | HR 72 | Resp 20 | Ht 66.0 in | Wt 123.0 lb

## 2013-07-06 DIAGNOSIS — D3A8 Other benign neuroendocrine tumors: Secondary | ICD-10-CM

## 2013-07-06 DIAGNOSIS — Z9889 Other specified postprocedural states: Secondary | ICD-10-CM

## 2013-07-06 DIAGNOSIS — Z902 Acquired absence of lung [part of]: Secondary | ICD-10-CM

## 2013-07-06 DIAGNOSIS — D3A00 Benign carcinoid tumor of unspecified site: Secondary | ICD-10-CM

## 2013-07-06 NOTE — Progress Notes (Signed)
HPI:  Sara Mcclure is a 67 year old woman who had a thoracoscopic left upper lobectomy on 11/19/2012. 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 She underwent adjuvant chemotherapy with cisplatin and Alimta for total of 4 cycles. Last dose was given 03/02/2013.  She says that she felt really poorly after the fourth cycle of chemotherapy. Her appetite was poor and she lost a significant amount of weight. She just generally felt tired and rundown. That lasted for about 2 months until July. Towards the end of July and August she began feeling better again. Her appetite improved and her general energy level improved as well.  She had a CT scan and saw Dr. Arbutus Ped on September 20. Unfortunately this CT showed multiple liver lesions and also an increase in soft tissue in the aortopulmonary window. This is very suspicious for metastatic disease and local recurrence.   Past Medical History  Diagnosis Date  . GERD (gastroesophageal reflux disease)   . Asthma   . Hypertension   . Diabetes mellitus   . Thyroid disease   . Anxiety   . Vertigo   . Breast cancer   . COPD (chronic obstructive pulmonary disease)   . PONV (postoperative nausea and vomiting)      Current Outpatient Prescriptions  Medication Sig Dispense Refill  . albuterol (ACCUNEB) 1.25 MG/3ML nebulizer solution Take 1 ampule by nebulization 2 (two) times daily as needed. For shortness of breath      . albuterol (PROVENTIL HFA;VENTOLIN HFA) 108 (90 BASE) MCG/ACT inhaler Inhale 2 puffs into the lungs every 6 (six) hours as needed. For shortness of breath      . ALPRAZolam (XANAX) 0.25 MG tablet Take 0.25 mg by mouth 3 (three) times daily as needed.       Marland Kitchen aspirin EC 81 MG tablet Take 81 mg by mouth daily.      . Cholecalciferol (VITAMIN D) 2000 UNITS tablet Take 2,000 Units by mouth 2 (two) times daily.      . Fluticasone-Salmeterol (ADVAIR) 250-50 MCG/DOSE AEPB Inhale 1 puff into the lungs  every 12 (twelve) hours.      . fosinopril (MONOPRIL) 20 MG tablet Take 20 mg by mouth daily.      Marland Kitchen levothyroxine (SYNTHROID, LEVOTHROID) 50 MCG tablet Take 50 mcg by mouth daily.      . metoprolol tartrate (LOPRESSOR) 25 MG tablet Take 1 tablet (25 mg total) by mouth 2 (two) times daily.  60 tablet  1  . montelukast (SINGULAIR) 10 MG tablet Take 10 mg by mouth at bedtime.      . Probiotic Product (ALIGN PO) Take 1 capsule by mouth at bedtime.        No current facility-administered medications for this visit.    Physical Exam BP 176/85  Pulse 72  Resp 20  Ht 5\' 6"  (1.676 m)  Wt 123 lb (55.792 kg)  BMI 19.86 kg/m2  SpO17 32% 67 year old woman in no acute distress Hard of hearing but otherwise neurologic intact Cardiac regular rate and rhythm normal S1 and S2 Lungs clear with equal breath sounds bilaterally No cervical or subclavicular adenopathy  Diagnostic Tests: CT CHEST CLINICAL DATA: Lung cancer status post partial lung resection.  History of breast cancer with lumpectomy in 1995. Chemotherapy  completed 4 months ago.  EXAM:  CT CHEST WITH CONTRAST  TECHNIQUE:  Multidetector CT imaging of the chest was performed during  intravenous contrast administration.  CONTRAST: 80mL OMNIPAQUE IOHEXOL 300 MG/ML SOLN  COMPARISON: Chest CT 03/26/2013 and 11/03/2012. PET-CT 11/06/2012.  FINDINGS:  There are stable postsurgical findings status post left upper lobe  resection. Best seen on the reformatted images is increased soft  tissue in the AP window worrisome for local recurrence or AP window  adenopathy. This measures 1.8 cm short axis on coronal image 38. No  other enlarged mediastinal or hilar lymph nodes are demonstrated.  There is a stable small left pleural effusion without associated  pleural nodularity. There is no right pleural effusion or  pericardial effusion. There is stable atherosclerosis of the aorta,  great vessels and coronary arteries. There are stable  postsurgical  changes within the left breast and left axilla.  There are stable emphysematous changes throughout the lungs with  mild biapical scarring. No discrete nodules are demonstrated  currently.  Images through the upper abdomen demonstrate the interval  development of multiple peripherally enhancing liver lesions  consistent with metastatic disease. Representative lesions measure  1.8 cm in the right lobe on image 57 and 1.7 cm in the left lobe on  image 61. A left adrenal nodule is stable.  There are no worrisome osseous findings. There is a stable central  calcified disc protrusion at T6-7.  IMPRESSION:  1. 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  2. Interval development of increased soft tissue in the AP window  worrisome for nodal metastasis or local recurrence of lung cancer.  3. No evidence of pulmonary metastasis. Stable chronic lung disease.  4. Stable left adrenal adenoma.  Electronically Signed  By: Roxy Horseman  On: 06/29/2013 13:41   Impression: 67 year old woman who is now 6 months out from lobectomy and adjuvant chemotherapy for stage IIa poorly differentiated neuroendocrine tumor. Unfortunately it looks like she has a recurrence with multiple liver lesion suspicious for metastases. All things considered I think she is taking numerous remarkably well.  Dr. Arbutus Ped to schedule her for a PET CT, MR of the brain, and liver biopsy for later this week in early next week.  Plan: She will followup with Dr. Arbutus Ped to discuss additional treatment after the testing has been done.  I will be happy to assist in any way possible.

## 2013-07-09 ENCOUNTER — Ambulatory Visit (HOSPITAL_COMMUNITY)
Admission: RE | Admit: 2013-07-09 | Discharge: 2013-07-09 | Disposition: A | Discharge status: Home / Self Care | Payer: Medicare Other | Source: Ambulatory Visit | Attending: Internal Medicine | Admitting: Internal Medicine

## 2013-07-09 ENCOUNTER — Encounter (HOSPITAL_COMMUNITY): Payer: Self-pay

## 2013-07-09 DIAGNOSIS — E119 Type 2 diabetes mellitus without complications: Secondary | ICD-10-CM | POA: Insufficient documentation

## 2013-07-09 DIAGNOSIS — J4489 Other specified chronic obstructive pulmonary disease: Secondary | ICD-10-CM | POA: Insufficient documentation

## 2013-07-09 DIAGNOSIS — Z79899 Other long term (current) drug therapy: Secondary | ICD-10-CM | POA: Insufficient documentation

## 2013-07-09 DIAGNOSIS — J449 Chronic obstructive pulmonary disease, unspecified: Secondary | ICD-10-CM | POA: Insufficient documentation

## 2013-07-09 DIAGNOSIS — K7689 Other specified diseases of liver: Secondary | ICD-10-CM | POA: Insufficient documentation

## 2013-07-09 DIAGNOSIS — I10 Essential (primary) hypertension: Secondary | ICD-10-CM | POA: Insufficient documentation

## 2013-07-09 DIAGNOSIS — Z853 Personal history of malignant neoplasm of breast: Secondary | ICD-10-CM | POA: Insufficient documentation

## 2013-07-09 DIAGNOSIS — C787 Secondary malignant neoplasm of liver and intrahepatic bile duct: Secondary | ICD-10-CM | POA: Insufficient documentation

## 2013-07-09 DIAGNOSIS — C7A1 Malignant poorly differentiated neuroendocrine tumors: Secondary | ICD-10-CM | POA: Insufficient documentation

## 2013-07-09 DIAGNOSIS — E079 Disorder of thyroid, unspecified: Secondary | ICD-10-CM | POA: Insufficient documentation

## 2013-07-09 LAB — CBC
MCH: 31.1 pg (ref 26.0–34.0)
MCHC: 34.4 g/dL (ref 30.0–36.0)
MCV: 90.6 fL (ref 78.0–100.0)
Platelets: 202 10*3/uL (ref 150–400)
RDW: 12.7 % (ref 11.5–15.5)
WBC: 8.8 10*3/uL (ref 4.0–10.5)

## 2013-07-09 LAB — GLUCOSE, CAPILLARY: Glucose-Capillary: 87 mg/dL (ref 70–99)

## 2013-07-09 MED ORDER — ONDANSETRON HCL 4 MG/2ML IJ SOLN: 4.0000 mg | Freq: Once | INTRAMUSCULAR | Status: DC

## 2013-07-09 MED ORDER — ONDANSETRON HCL 4 MG/2ML IJ SOLN
INTRAMUSCULAR | Status: AC
  Filled 2013-07-09: qty 2

## 2013-07-09 MED ORDER — FENTANYL CITRATE 0.05 MG/ML IJ SOLN
INTRAMUSCULAR | Status: AC | PRN
  Administered 2013-07-09: 100 ug via INTRAVENOUS

## 2013-07-09 MED ORDER — MIDAZOLAM HCL 2 MG/2ML IJ SOLN
INTRAMUSCULAR | Status: AC | PRN
  Administered 2013-07-09: 2 mg via INTRAVENOUS

## 2013-07-09 MED ORDER — FENTANYL CITRATE 0.05 MG/ML IJ SOLN
INTRAMUSCULAR | Status: AC
  Filled 2013-07-09: qty 4

## 2013-07-09 MED ORDER — MIDAZOLAM HCL 2 MG/2ML IJ SOLN
INTRAMUSCULAR | Status: AC
  Filled 2013-07-09: qty 4

## 2013-07-09 MED ORDER — ONDANSETRON HCL 4 MG/2ML IJ SOLN
INTRAMUSCULAR | Status: AC | PRN
  Administered 2013-07-09: 4 mg via INTRAVENOUS

## 2013-07-09 MED ORDER — SODIUM CHLORIDE 0.9 % IV SOLN: INTRAVENOUS | Status: DC

## 2013-07-09 NOTE — Progress Notes (Signed)
Pt is doing very well. She denies any nausea, denies pain.  Pt tolerating her coffee and is without complaints.

## 2013-07-09 NOTE — H&P (Signed)
Sara Mcclure is an 67 y.o. female.   Chief Complaint: "I'm having a liver biopsy" HPI: Patient with remote history of left breast carcinoma and most recently NSC lung carcinoma, high grade poorly diff neuroendocrine carcinoma, s/p LUL lobectomy with LN dissection 11/2012. Pt now found to have disease recurrence with multiple liver lesions. She presents today for US guided liver lesion biopsy.  Past Medical History  Diagnosis Date  . GERD (gastroesophageal reflux disease)   . Asthma   . Hypertension   . Diabetes mellitus   . Thyroid disease   . Anxiety   . Vertigo   . Breast cancer   . COPD (chronic obstructive pulmonary disease)   . PONV (postoperative nausea and vomiting)     Past Surgical History  Procedure Laterality Date  . Cardiac catherization    . Breast lumpectomy    . Tubal ligation    . Video assisted thoracoscopy (vats)/wedge resection Left 11/19/2012    Procedure: VIDEO ASSISTED THORACOSCOPY (VATS)/WEDGE RESECTION;  Surgeon: Loreli Slot, MD;  Location: [facility] OR;  Service: Thoracic;  Laterality: Left;  left upper lobe wedge resection  . Lobectomy Left 11/19/2012    Procedure: LOBECTOMY;  Surgeon: Loreli Slot, MD;  Location: [facility] OR;  Service: Thoracic;  Laterality: Left;  left upper lobe  . Lymph node dissection Left 11/19/2012    Procedure: LYMPH NODE DISSECTION;  Surgeon: Loreli Slot, MD;  Location: [facility] OR;  Service: Thoracic;  Laterality: Left;    Family History  Problem Relation Age of Onset  . Cancer Mother   . COPD Mother   . Hypertension Mother   . COPD Father   . COPD Sister   . Cancer Sister   . COPD Brother   . Diabetes Brother   . Hyperlipidemia Brother   . Hypertension Brother   . Stroke Brother    Social History:  reports that she has been smoking Cigarettes.  She has a 50 pack-year smoking history. She does not have any smokeless tobacco history on file. She reports that she does not drink alcohol or use illicit  drugs.  Allergies:  Allergies  Allergen Reactions  . Metformin And Related Nausea Only    Current outpatient prescriptions:albuterol (ACCUNEB) 1.25 MG/3ML nebulizer solution, Take 1 ampule by nebulization 2 (two) times daily as needed. For shortness of breath, Disp: , Rfl: ;  albuterol (PROVENTIL HFA;VENTOLIN HFA) 108 (90 BASE) MCG/ACT inhaler, Inhale 2 puffs into the lungs every 6 (six) hours as needed. For shortness of breath, Disp: , Rfl:  ALPRAZolam (XANAX) 0.25 MG tablet, Take 0.25 mg by mouth 3 (three) times daily as needed for anxiety. , Disp: , Rfl: ;  Alum Hydroxide-Mag Carbonate (GAVISCON PO), Take 1 tablet by mouth daily as needed (stomach)., Disp: , Rfl: ;  aspirin EC 81 MG tablet, Take 81 mg by mouth daily., Disp: , Rfl: ;  cetirizine (ZYRTEC) 10 MG tablet, Take 10 mg by mouth daily as needed for allergies., Disp: , Rfl:  Cholecalciferol (VITAMIN D) 2000 UNITS tablet, Take 2,000 Units by mouth 2 (two) times daily., Disp: , Rfl: ;  Fluticasone-Salmeterol (ADVAIR) 250-50 MCG/DOSE AEPB, Inhale 1 puff into the lungs every 12 (twelve) hours., Disp: , Rfl: ;  fosinopril (MONOPRIL) 20 MG tablet, Take 20 mg by mouth daily after breakfast. , Disp: , Rfl: ;  levothyroxine (SYNTHROID, LEVOTHROID) 50 MCG tablet, Take 50 mcg by mouth daily before breakfast. , Disp: , Rfl:  metoprolol tartrate (LOPRESSOR) 25 MG tablet, Take  1 tablet (25 mg total) by mouth 2 (two) times daily., Disp: 60 tablet, Rfl: 1;  montelukast (SINGULAIR) 10 MG tablet, Take 10 mg by mouth at bedtime., Disp: , Rfl:  Current facility-administered medications:0.9 %  sodium chloride infusion, , Intravenous, Continuous, D Jeananne Rama, PA-C   Results for orders placed during the hospital encounter of 07/09/13 (from the past 48 hour(s))  APTT     Status: None   Collection Time    07/09/13  8:35 AM      Result Value Range   aPTT 36  24 - 37 seconds  CBC     Status: None   Collection Time    07/09/13  8:35 AM      Result Value  Range   WBC 8.8  4.0 - 10.5 K/uL   RBC 4.27  3.87 - 5.11 MIL/uL   Hemoglobin 13.3  12.0 - 15.0 g/dL   HCT 09.8  11.9 - 14.7 %   MCV 90.6  78.0 - 100.0 fL   MCH 31.1  26.0 - 34.0 pg   MCHC 34.4  30.0 - 36.0 g/dL   RDW 82.9  56.2 - 13.0 %   Platelets 202  150 - 400 K/uL  PROTIME-INR     Status: None   Collection Time    07/09/13  8:35 AM      Result Value Range   Prothrombin Time 12.3  11.6 - 15.2 seconds   INR 0.93  0.00 - 1.49   No results found.  Review of Systems  Constitutional: Positive for malaise/fatigue. Negative for fever and chills.  HENT: Positive for hearing loss.   Respiratory: Positive for cough and shortness of breath. Negative for hemoptysis.   Cardiovascular: Negative for chest pain.  Gastrointestinal: Negative for vomiting and abdominal pain.       Occ nausea  Musculoskeletal: Positive for back pain.  Neurological: Negative for headaches.   Vitals:  BP 159/77  HR 66  R 16  TEMP 98.4  O2 SATS 97% RA Physical Exam  Constitutional: She is oriented to person, place, and time. She appears well-developed and well-nourished.  Cardiovascular: Normal rate and regular rhythm.   Respiratory: Effort normal.  Few insp wheezes/rhonchi  GI: Soft. Bowel sounds are normal. There is no tenderness.  Musculoskeletal: Normal range of motion. She exhibits no edema.  Neurological: She is alert and oriented to person, place, and time.     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 Plan is for US guided liver lesion biopsy today. Details/risks of procedure d/w pt/husband with their understanding and consent.  ALLRED,D KEVIN 07/09/2013, 9:35 AM

## 2013-07-09 NOTE — Procedures (Signed)
Technically successful US guided biopsy of lesion within the right lobe of the liver adjacent to the gall bladder fossa.  No immediate complications.

## 2013-07-10 ENCOUNTER — Ambulatory Visit
Admission: RE | Admit: 2013-07-10 | Discharge: 2013-07-10 | Disposition: A | Discharge status: Home / Self Care | Payer: Medicare Other | Source: Ambulatory Visit | Attending: Internal Medicine | Admitting: Internal Medicine

## 2013-07-10 MED ORDER — GADOBENATE DIMEGLUMINE 529 MG/ML IV SOLN
12.0000 mL | Freq: Once | INTRAVENOUS | Status: AC | PRN
  Administered 2013-07-10: 12 mL via INTRAVENOUS

## 2013-07-12 ENCOUNTER — Encounter (HOSPITAL_COMMUNITY): Payer: Self-pay

## 2013-07-12 ENCOUNTER — Encounter (HOSPITAL_COMMUNITY)
Admission: RE | Admit: 2013-07-12 | Discharge: 2013-07-12 | Disposition: A | Discharge status: Home / Self Care | Payer: Medicare Other | Source: Ambulatory Visit | Attending: Internal Medicine | Admitting: Internal Medicine

## 2013-07-12 DIAGNOSIS — Z9221 Personal history of antineoplastic chemotherapy: Secondary | ICD-10-CM | POA: Insufficient documentation

## 2013-07-12 DIAGNOSIS — C787 Secondary malignant neoplasm of liver and intrahepatic bile duct: Secondary | ICD-10-CM | POA: Insufficient documentation

## 2013-07-12 DIAGNOSIS — D3500 Benign neoplasm of unspecified adrenal gland: Secondary | ICD-10-CM | POA: Insufficient documentation

## 2013-07-12 DIAGNOSIS — C771 Secondary and unspecified malignant neoplasm of intrathoracic lymph nodes: Secondary | ICD-10-CM | POA: Insufficient documentation

## 2013-07-12 DIAGNOSIS — Z853 Personal history of malignant neoplasm of breast: Secondary | ICD-10-CM | POA: Insufficient documentation

## 2013-07-12 DIAGNOSIS — J90 Pleural effusion, not elsewhere classified: Secondary | ICD-10-CM | POA: Insufficient documentation

## 2013-07-12 DIAGNOSIS — C3410 Malignant neoplasm of upper lobe, unspecified bronchus or lung: Secondary | ICD-10-CM | POA: Insufficient documentation

## 2013-07-12 DIAGNOSIS — Z9010 Acquired absence of unspecified breast and nipple: Secondary | ICD-10-CM | POA: Insufficient documentation

## 2013-07-12 DIAGNOSIS — Z902 Acquired absence of lung [part of]: Secondary | ICD-10-CM | POA: Insufficient documentation

## 2013-07-12 LAB — GLUCOSE, CAPILLARY: Glucose-Capillary: 98 mg/dL (ref 70–99)

## 2013-07-12 MED ORDER — FLUDEOXYGLUCOSE F - 18 (FDG) INJECTION
17.8000 | Freq: Once | INTRAVENOUS | Status: AC | PRN
  Administered 2013-07-12: 17.8 via INTRAVENOUS

## 2013-07-13 ENCOUNTER — Ambulatory Visit (HOSPITAL_BASED_OUTPATIENT_CLINIC_OR_DEPARTMENT_OTHER): Payer: Medicare Other | Admitting: Internal Medicine

## 2013-07-13 ENCOUNTER — Telehealth: Payer: Self-pay | Admitting: Internal Medicine

## 2013-07-13 ENCOUNTER — Telehealth: Payer: Self-pay | Admitting: *Deleted

## 2013-07-13 ENCOUNTER — Encounter: Payer: Self-pay | Admitting: Internal Medicine

## 2013-07-13 VITALS — BP 174/76 | HR 71 | Temp 99.1°F | Resp 20 | Ht 67.0 in | Wt 124.7 lb

## 2013-07-13 DIAGNOSIS — C7A098 Malignant carcinoid tumors of other sites: Secondary | ICD-10-CM

## 2013-07-13 DIAGNOSIS — C3490 Malignant neoplasm of unspecified part of unspecified bronchus or lung: Secondary | ICD-10-CM | POA: Insufficient documentation

## 2013-07-13 DIAGNOSIS — C3492 Malignant neoplasm of unspecified part of left bronchus or lung: Secondary | ICD-10-CM

## 2013-07-13 DIAGNOSIS — C787 Secondary malignant neoplasm of liver and intrahepatic bile duct: Secondary | ICD-10-CM

## 2013-07-13 DIAGNOSIS — C7A1 Malignant poorly differentiated neuroendocrine tumors: Secondary | ICD-10-CM

## 2013-07-13 MED ORDER — PROCHLORPERAZINE MALEATE 10 MG PO TABS: 10.0000 mg | ORAL_TABLET | Freq: Four times a day (QID) | ORAL | Status: DC | PRN

## 2013-07-13 NOTE — Telephone Encounter (Signed)
gv andprinted appt sched and avs for pt for OCT emailed MW to add tx...  °

## 2013-07-13 NOTE — Progress Notes (Signed)
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   PRIOR THERAPY:  1) Status post left upper lobectomy with lymph node dissection.  2) Adjuvant systemic chemotherapy with cisplatin 75 mg region squared and Alimta 500 mg per meter squared given every 3 weeks for total of 4 cycles, status post 4 cycles. Last dose was given 03/02/2013.   CURRENT THERAPY: Systemic chemotherapy with carboplatin for AUC of 5 on day 1 and etoposide at 120 mg/M2 on days 1, 2 and 3 with Neulasta support on day 4 every 3 weeks. First dose of chemotherapy on 07/19/2013.  CHEMOTHERAPY INTENT: Palliative  CURRENT # OF CHEMOTHERAPY CYCLES: 1  CURRENT ANTIEMETICS: Zofran, dexamethasone and Compazine.  CURRENT SMOKING STATUS: Former smoker  ORAL CHEMOTHERAPY AND CONSENT: None  CURRENT BISPHOSPHONATES USE: None  PAIN MANAGEMENT: 0/10  NARCOTICS INDUCED CONSTIPATION: None  LIVING WILL AND CODE STATUS: ?    INTERVAL HISTORY: Sara Mcclure 67 y.o. female returns to the clinic today for followup visit accompanied by her husband. The patient is feeling fine today with no specific complaints except for shortness breath with exertion. She denied having any significant chest pain, cough or hemoptysis. The patient denied having any bony pains. She has no weight loss or night sweats. The patient denied having any nausea or vomiting. She has no fever or chills. She recently was found to have evidence for disease recurrence on the scan performed few weeks ago. I will  ultrasound-guided core biopsy of one of her liver lesion in addition to a PET scan and MRI of the brain. The patient is here today for evaluation and discussion of her biopsy and the scan results.  MEDICAL HISTORY: Past Medical History  Diagnosis Date  . GERD (gastroesophageal reflux disease)   . Asthma   . Hypertension   . Diabetes mellitus   . Thyroid disease   . Anxiety   . Vertigo   . Breast cancer   . COPD (chronic obstructive pulmonary disease)   . PONV (postoperative nausea and vomiting)     ALLERGIES:  is allergic to metformin and related.  MEDICATIONS:  Current Outpatient Prescriptions  Medication Sig Dispense Refill  . albuterol (ACCUNEB) 1.25 MG/3ML nebulizer solution Take 1 ampule by nebulization 2 (two) times daily as needed. For shortness of breath      . albuterol (PROVENTIL HFA;VENTOLIN HFA) 108 (90 BASE) MCG/ACT inhaler Inhale 2 puffs into the lungs every 6 (six) hours as needed. For shortness of breath      . ALPRAZolam (XANAX) 0.25 MG tablet Take 0.25 mg by mouth 3 (three) times daily as needed for anxiety.       Marland Kitchen aspirin EC 81 MG tablet Take 81 mg by mouth daily.      . cetirizine (ZYRTEC) 10 MG tablet Take 10 mg by mouth daily as needed for allergies.      . Cholecalciferol (VITAMIN D) 2000 UNITS tablet Take 2,000 Units by mouth 2 (two) times daily.      . Fluticasone-Salmeterol (ADVAIR) 250-50 MCG/DOSE AEPB Inhale 1 puff into the lungs every 12 (twelve) hours.      Marland Kitchen  fosinopril (MONOPRIL) 20 MG tablet Take 20 mg by mouth daily after breakfast.       . levothyroxine (SYNTHROID, LEVOTHROID) 50 MCG tablet Take 50 mcg by mouth daily before breakfast.       . metoprolol tartrate (LOPRESSOR) 25 MG tablet Take 1 tablet (25 mg total) by mouth 2 (two) times daily.  60 tablet  1  . montelukast (SINGULAIR) 10 MG tablet Take 10 mg by mouth at bedtime.      . Alum Hydroxide-Mag Carbonate (GAVISCON PO) Take 1 tablet by mouth daily as needed (stomach).      . ondansetron  (ZOFRAN-ODT) 8 MG disintegrating tablet 8 mg.       No current facility-administered medications for this visit.    SURGICAL HISTORY:  Past Surgical History  Procedure Laterality Date  . Cardiac catherization    . Breast lumpectomy    . Tubal ligation    . Video assisted thoracoscopy (vats)/wedge resection Left 11/19/2012    Procedure: VIDEO ASSISTED THORACOSCOPY (VATS)/WEDGE RESECTION;  Surgeon: Loreli Slot, MD;  Location: [facility] OR;  Service: Thoracic;  Laterality: Left;  left upper lobe wedge resection  . Lobectomy Left 11/19/2012    Procedure: LOBECTOMY;  Surgeon: Loreli Slot, MD;  Location: [facility] OR;  Service: Thoracic;  Laterality: Left;  left upper lobe  . Lymph node dissection Left 11/19/2012    Procedure: LYMPH NODE DISSECTION;  Surgeon: Loreli Slot, MD;  Location: [facility] OR;  Service: Thoracic;  Laterality: Left;    REVIEW OF SYSTEMS:  Constitutional: negative Eyes: negative Ears, nose, mouth, throat, and face: negative Respiratory: positive for dyspnea on exertion Cardiovascular: negative Gastrointestinal: negative Genitourinary:negative Integument/breast: negative Hematologic/lymphatic: negative Musculoskeletal:negative Neurological: negative Behavioral/Psych: negative Endocrine: negative Allergic/Immunologic: negative   PHYSICAL EXAMINATION: General appearance: alert, cooperative and no distress Head: Normocephalic, without obvious abnormality, atraumatic Neck: no adenopathy, no JVD, supple, symmetrical, trachea midline and thyroid not enlarged, symmetric, no tenderness/mass/nodules Lymph nodes: Cervical, supraclavicular, and axillary nodes normal. Resp: clear to auscultation bilaterally Back: symmetric, no curvature. ROM normal. No CVA tenderness. Cardio: regular rate and rhythm, S1, S2 normal, no murmur, click, rub or gallop GI: soft, non-tender; bowel sounds normal; no masses,  no organomegaly Extremities: extremities normal, atraumatic, no cyanosis  or edema Neurologic: Alert and oriented X 3, normal strength and tone. Normal symmetric reflexes. Normal coordination and gait  ECOG PERFORMANCE STATUS: 1 - Symptomatic but completely ambulatory  Blood pressure 174/76, pulse 71, temperature 99.1 F (37.3 C), temperature source Oral, resp. rate 20, height 5\' 7"  (1.702 m), weight 124 lb 11.2 oz (56.564 kg), SpO2 99.00%.  LABORATORY DATA: Lab Results  Component Value Date   WBC 8.8 07/09/2013   HGB 13.3 07/09/2013   HCT 38.7 07/09/2013   MCV 90.6 07/09/2013   PLT 202 07/09/2013      Chemistry      Component Value Date/Time   NA 137 06/29/2013 1103   NA 140 11/21/2012 0445   K 4.3 06/29/2013 1103   K 3.8 11/21/2012 0445   CL 102 03/30/2013 0951   CL 105 11/21/2012 0445   CO2 24 06/29/2013 1103   CO2 27 11/21/2012 0445   BUN 12.0 06/29/2013 1103   BUN 6 11/21/2012 0445   CREATININE 0.9 06/29/2013 1103   CREATININE 0.55 11/21/2012 0445      Component Value Date/Time   CALCIUM 10.1 06/29/2013 1103   CALCIUM 9.3 11/21/2012 0445   ALKPHOS 68 06/29/2013 1103   ALKPHOS 65 11/21/2012 0445  AST 22 06/29/2013 1103   AST 15 11/21/2012 0445   ALT 15 06/29/2013 1103   ALT 16 11/21/2012 0445   BILITOT 0.35 06/29/2013 1103   BILITOT 0.4 11/21/2012 0445       RADIOGRAPHIC STUDIES: Ct Chest W Contrast  06/29/2013   CLINICAL DATA:  Lung cancer status post partial lung resection. History of breast cancer with lumpectomy in 1995. Chemotherapy completed 4 months ago.  EXAM: CT CHEST WITH CONTRAST  TECHNIQUE: Multidetector CT imaging of the chest was performed during intravenous contrast administration.  CONTRAST:  80mL OMNIPAQUE IOHEXOL 300 MG/ML  SOLN  COMPARISON:  Chest CT 03/26/2013 and 11/03/2012. PET-CT 11/06/2012.  FINDINGS: There are stable postsurgical findings status post left upper lobe resection. Best seen on the reformatted images is increased soft tissue in the AP window worrisome for local recurrence or AP window adenopathy. This measures 1.8 cm short axis on  coronal image 38. No other enlarged mediastinal or hilar lymph nodes are demonstrated.  There is a stable small left pleural effusion without associated pleural nodularity. There is no right pleural effusion or pericardial effusion. There is stable atherosclerosis of the aorta, great vessels and coronary arteries. There are stable postsurgical changes within the left breast and left axilla.  There are stable emphysematous changes throughout the lungs with mild biapical scarring. No discrete nodules are demonstrated currently.  Images through the upper abdomen demonstrate the interval development of multiple peripherally enhancing liver lesions consistent with metastatic disease. Representative lesions measure 1.8 cm in the right lobe on image 57 and 1.7 cm in the left lobe on image 61. A left adrenal nodule is stable.  There are no worrisome osseous findings. There is a stable central calcified disc protrusion at T6-7.  IMPRESSION: 1. 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  2. Interval development of increased soft tissue in the AP window worrisome for nodal metastasis or local recurrence of lung cancer.  3. No evidence of pulmonary metastasis. Stable chronic lung disease.  4. Stable left adrenal adenoma.   Electronically Signed   By: Roxy Horseman   On: 06/29/2013 13:41   Mr Laqueta Jean Wo Contrast  07/10/2013   CLINICAL DATA:  67 year old female with lung cancer. Progressive disease on recent chest CT. Restaging.  EXAM: MRI HEAD WITHOUT AND WITH CONTRAST  TECHNIQUE: Multiplanar, multiecho pulse sequences of the brain and surrounding structures were obtained according to standard protocol without and with intravenous contrast  CONTRAST:  12mL MULTIHANCE GADOBENATE DIMEGLUMINE 529 MG/ML IV SOLN  COMPARISON:  Brain MRI 01/09/2013.  FINDINGS: Stable cerebral volume. Stable post-contrast appearance of the brain, with no definite abnormal intracranial enhancement (ACA artery ectasia suspected on  series 13, image 49 and unchanged).  No midline shift, mass effect, or evidence of intracranial mass lesion.  No restricted diffusion or evidence of acute infarction. No ventriculomegaly. No acute intracranial hemorrhage identified. Negative pituitary and cervicomedullary junction. Grossly negative visualized cervical spine. Bone marrow signal is stable and within normal limits.  Stable minimal to mild for age nonspecific occasional cerebral white matter T2 and FLAIR hyperintense foci. No new signal abnormality. Major intracranial vascular flow voids are stable.  Visualized orbit soft tissues are within normal limits. Improved paranasal sinus aeration. Mastoids are clear. Negative scalp soft tissues.  IMPRESSION: Stable and negative MRI appearance of the brain. No acute or metastatic intracranial abnormality.   Electronically Signed   By: Augusto Gamble M.D.   On: 07/10/2013 15:02   Nm Pet Image Restag (ps)  Skull Base To Thigh  07/12/2013   ADDENDUM REPORT: 07/12/2013 10:18  ADDENDUM: Omitted from the body of the report: Stable 13 mm left adrenal adenoma (series 2/image 131), non-FDG-avid.  The remainder of the report, including the impression, is unchanged.   Electronically Signed   By: Charline Bills M.D.   On: 07/12/2013 10:18   07/12/2013   CLINICAL DATA:  Subsequent treatment strategy for left upper lobe lung cancer status post left lobectomy. New liver metastasis. Last chemotherapy 02/2013. History of left breast cancer status post lumpectomy.  EXAM: NUCLEAR MEDICINE PET SKULL BASE TO THIGH  FASTING BLOOD GLUCOSE:  Value:  98 mg/dl  TECHNIQUE: 16.1 mCi W-96 FDG was injected intravenously. CT data was obtained and used for attenuation correction and anatomic localization only. (This was not acquired as a diagnostic CT examination.) Additional exam technical data entered on technologist worksheet.  COMPARISON:  CT chest dated 06/29/2013. PET-CT dated 11/06/2012.  FINDINGS: NECK  No hypermetabolic lymph nodes  in the neck.  CHEST  Status post left upper lobectomy with associated volume loss.  No new/suspicious pulmonary nodules. Moderate paraseptal emphysematous changes. Small left pleural effusion. No pneumothorax.  Nodal metastases in the left AP window (series 2/images 70 and 76), better visualized on prior CT chest, max SUV 9.7. 11 x 15 mm left prevascular node (series 2/ image 84), max SUV 6.7. Two extra-pleural lymph nodes at the left costovertebral junction measuring 12 mm (series 2/image 93) and 9 mm (series 2/image 118), max SUV 7.3.  Postsurgical changes in the lower left breast with surgical clips in the left axilla.  ABDOMEN/PELVIS  At least five hepatic metastases. Two representative lesions in the anterior segment right hepatic lobe measure 19 x 24 mm (series 2/image 129) and 25 x 22 mm (series 2/image 135), max SUV 9.7. Additional lesions are present in the lateral segment left hepatic lobe (series 2/image 118) and medial segment left hepatic lobe (series 2/image 133). Possible lesion in the right hepatic dome (series 2/image 116), although without definite hypermetabolism on the current study.  No abnormal hypermetabolic activity within the pancreas, adrenal glands, or spleen.  No hypermetabolic lymph nodes in the abdomen or pelvis.  Focal hypermetabolism in the region of the rectum (PET image 226), nonspecific.  SKELETON  10 mm lytic metastasis in the left iliac bone (series 2/image 195), max SUV 5.6.  IMPRESSION: Status post left upper lobectomy.  Prevascular, AP window, and extra-pleural nodal metastases, max SUV 9.7.  Small left pleural effusion.  At least 5 hepatic metastases, as described above, max SUV 9.7.  Lytic metastasis in the left iliac bone, max SUV 5.6.  Electronically Signed: By: Charline Bills M.D. On: 07/12/2013 10:10   [place] Core Biopsy  07/09/2013   *RADIOLOGY REPORT*  Indication: History of lung and breast cancer, now with development of multiple hepatic lesions worrisome for  metastatic disease  ULTRASOUND GUIDED LIVER LESION BIOPSY  Comparison: Chest CT - 06/29/2013; 03/26/2013  Intravenous medications: Fentanyl 100 mcg IV; Versed 2 mg IV  Total Moderate Sedation time: 20 minutes  Complications: None immediate  Procedure:  Informed written consent was obtained from the patient after a discussion of the risks, benefits and alternatives to treatment. The patient understands and consents the procedure.  A timeout was performed prior to the initiation of the procedure.  Ultrasound scanning was performed of the right upper abdominal quadrant demonstrates several mixed echogenic nodules within the right lobe of the liver compatible with the findings on recently performed chest  CT.  The dominant approximately 2.4 x 1.6 cm lesion adjacent to the gallbladder fossa was targeted for biopsy.  The right upper abdominal quadrant was prepped and draped in the usual sterile fashion. After the patient with sedated, initial trajectory was aborted secondary to interposition of the gallbladder.  As such, a more cranial, intercostal approach was necessary.  The overlying soft tissues were anesthetized with 1% lidocaine with epinephrine.  A 17 gauge, 6.8 cm co-axial needle was advanced into a peripheral aspect of the lesion and  3 core biopsies with an 18 gauge core device under direct ultrasound guidance.  The co-axial needle was removed and hemostasis was obtained with manual compression.  Post procedural scanning was negative for definitive area of hemorrhage or additional complication.  A dressing was placed.  The patient tolerated the procedure well without immediate post procedural complication.  Impression:  Technically successful ultrasound guided core needle biopsy of dominant mixed echogenic solid nodule adjacent to the gallbladder fossa.   Original Report Authenticated By: Tacey Ruiz, MD    ASSESSMENT AND PLAN: This is a very pleasant 67 years old white female recently diagnosed with  neuroendocrine carcinoma, small cell with multiple liver metastases as well as left AP window lymphadenopathy, extrapleural nodal metastasis and left iliac bone lesion. The patient has no evidence for metastatic disease to the brain.  She has a history of neuroendocrine carcinoma, large cell type diagnosed as stage II in February of 2014 status post left upper lobectomy followed by 4 cycles of adjuvant chemotherapy with cisplatin and etoposide. I think the two processes are the same poorly differentiated neuroendocrine carcinoma but different interpretation of the pathologists regarding a small versus large cell type. The progressive nature of this disease is highly suspicious for small cell carcinoma.  I have a lengthy discussion with the patient and her husband today about her current disease status and treatment options. I showed them the images of the PET scan. I recommended for the patient treatment with systemic chemotherapy in the form of carboplatin for AUC of 5 on day 1 and etoposide 120 mg/M2 on days 1, 2 and 3 with Neulasta support on day 4. I discussed with the patient adverse effect of this treatment including but not limited to alopecia, myelosuppression, nausea and vomiting, peripheral neuropathy, liver or renal dysfunction. The patient would like to proceed with treatment as planned. She is expected to start the first cycle of this treatment on 07/19/2013. I will call her pharmacy with prescription for Compazine 10 mg by mouth every 6 hours as needed for nausea. The patient would come back for followup visit in 2 weeks for evaluation and management any adverse effect of her treatment. She was advised to call immediately if she has any concerning symptoms in the interval.  The patient voices understanding of current disease status and treatment options and is in agreement with the current care plan.  All questions were answered. The patient knows to call the clinic with any problems,  questions or concerns. We can certainly see the patient much sooner if necessary.  I spent 20 minutes counseling the patient face to face. The total time spent in the appointment was 30 minutes.

## 2013-07-13 NOTE — Patient Instructions (Signed)
CURRENT THERAPY: Systemic chemotherapy with carboplatin for AUC of 5 on day 1 and etoposide at 120 mg/M2 on days 1, 2 and 3 with Neulasta support on day 4 every 3 weeks. First dose of chemotherapy on 07/19/2013.  CHEMOTHERAPY INTENT: Palliative  CURRENT # OF CHEMOTHERAPY CYCLES: 1  CURRENT ANTIEMETICS: Zofran, dexamethasone and Compazine.  CURRENT SMOKING STATUS: Former smoker  ORAL CHEMOTHERAPY AND CONSENT: None  CURRENT BISPHOSPHONATES USE: None  PAIN MANAGEMENT: 0/10  NARCOTICS INDUCED CONSTIPATION: None  LIVING WILL AND CODE STATUS: ?

## 2013-07-13 NOTE — Telephone Encounter (Signed)
lvm for pt regarding to time changes of appt on 10.6.14...Marland Kitchenpt comming on monday to get updated sched

## 2013-07-13 NOTE — Telephone Encounter (Signed)
Per staff message and POF I have scheduled appts.  JMW  

## 2013-07-19 ENCOUNTER — Other Ambulatory Visit: Payer: Medicare Other | Admitting: Lab

## 2013-07-19 ENCOUNTER — Ambulatory Visit (HOSPITAL_BASED_OUTPATIENT_CLINIC_OR_DEPARTMENT_OTHER): Payer: Medicare Other

## 2013-07-19 ENCOUNTER — Other Ambulatory Visit (HOSPITAL_BASED_OUTPATIENT_CLINIC_OR_DEPARTMENT_OTHER): Payer: Medicare Other | Admitting: Lab

## 2013-07-19 VITALS — BP 154/67 | HR 73 | Temp 98.9°F | Resp 20

## 2013-07-19 DIAGNOSIS — C7A1 Malignant poorly differentiated neuroendocrine tumors: Secondary | ICD-10-CM

## 2013-07-19 DIAGNOSIS — C3492 Malignant neoplasm of unspecified part of left bronchus or lung: Secondary | ICD-10-CM

## 2013-07-19 DIAGNOSIS — Z5111 Encounter for antineoplastic chemotherapy: Secondary | ICD-10-CM

## 2013-07-19 DIAGNOSIS — C7B8 Other secondary neuroendocrine tumors: Secondary | ICD-10-CM

## 2013-07-19 DIAGNOSIS — C787 Secondary malignant neoplasm of liver and intrahepatic bile duct: Secondary | ICD-10-CM

## 2013-07-19 DIAGNOSIS — C7A098 Malignant carcinoid tumors of other sites: Secondary | ICD-10-CM

## 2013-07-19 LAB — COMPREHENSIVE METABOLIC PANEL (CC13)
ALT: 9 U/L (ref 0–55)
Albumin: 3.3 g/dL — ABNORMAL LOW (ref 3.5–5.0)
Alkaline Phosphatase: 70 U/L (ref 40–150)
BUN: 12.4 mg/dL (ref 7.0–26.0)
Glucose: 83 mg/dl (ref 70–140)
Potassium: 3.8 mEq/L (ref 3.5–5.1)
Sodium: 138 mEq/L (ref 136–145)
Total Bilirubin: 0.3 mg/dL (ref 0.20–1.20)
Total Protein: 6.7 g/dL (ref 6.4–8.3)

## 2013-07-19 LAB — CBC WITH DIFFERENTIAL/PLATELET
Basophils Absolute: 0 10*3/uL (ref 0.0–0.1)
EOS%: 2.2 % (ref 0.0–7.0)
Eosinophils Absolute: 0.2 10*3/uL (ref 0.0–0.5)
HCT: 33.7 % — ABNORMAL LOW (ref 34.8–46.6)
HGB: 11.6 g/dL (ref 11.6–15.9)
LYMPH%: 27.3 % (ref 14.0–49.7)
MCHC: 34.4 g/dL (ref 31.5–36.0)
MCV: 89.9 fL (ref 79.5–101.0)
MONO%: 9.1 % (ref 0.0–14.0)
NEUT#: 5 10*3/uL (ref 1.5–6.5)
NEUT%: 61 % (ref 38.4–76.8)
RBC: 3.75 10*6/uL (ref 3.70–5.45)
RDW: 13 % (ref 11.2–14.5)
lymph#: 2.3 10*3/uL (ref 0.9–3.3)
nRBC: 0 % (ref 0–0)

## 2013-07-19 MED ORDER — SODIUM CHLORIDE 0.9 % IV SOLN
372.0000 mg | Freq: Once | INTRAVENOUS | Status: AC
  Administered 2013-07-19: 370 mg via INTRAVENOUS
  Filled 2013-07-19: qty 37

## 2013-07-19 MED ORDER — SODIUM CHLORIDE 0.9 % IV SOLN
Freq: Once | INTRAVENOUS | Status: AC
  Administered 2013-07-19: 15:00:00 via INTRAVENOUS

## 2013-07-19 MED ORDER — SODIUM CHLORIDE 0.9 % IV SOLN
120.0000 mg/m2 | Freq: Once | INTRAVENOUS | Status: AC
  Administered 2013-07-19: 200 mg via INTRAVENOUS
  Filled 2013-07-19: qty 10

## 2013-07-19 MED ORDER — DEXAMETHASONE SODIUM PHOSPHATE 20 MG/5ML IJ SOLN
20.0000 mg | Freq: Once | INTRAMUSCULAR | Status: AC
  Administered 2013-07-19: 20 mg via INTRAVENOUS

## 2013-07-19 MED ORDER — DEXAMETHASONE SODIUM PHOSPHATE 20 MG/5ML IJ SOLN
INTRAMUSCULAR | Status: AC
  Filled 2013-07-19: qty 5

## 2013-07-19 MED ORDER — ONDANSETRON 16 MG/50ML IVPB (CHCC)
INTRAVENOUS | Status: AC
  Filled 2013-07-19: qty 16

## 2013-07-19 MED ORDER — ONDANSETRON 16 MG/50ML IVPB (CHCC)
16.0000 mg | Freq: Once | INTRAVENOUS | Status: AC
  Administered 2013-07-19: 16 mg via INTRAVENOUS

## 2013-07-19 NOTE — Patient Instructions (Addendum)
[facility] Discharge Instructions for Patients Receiving Chemotherapy  Today you received the following chemotherapy agents Carboplatin/Etoposide.  To help prevent nausea and vomiting after your treatment, we encourage you to take your nausea medication as needed.   If you develop nausea and vomiting that is not controlled by your nausea medication, call the clinic.   BELOW ARE SYMPTOMS THAT SHOULD BE REPORTED IMMEDIATELY:  *FEVER GREATER THAN 100.5 F  *CHILLS WITH OR WITHOUT FEVER  NAUSEA AND VOMITING THAT IS NOT CONTROLLED WITH YOUR NAUSEA MEDICATION  *UNUSUAL SHORTNESS OF BREATH  *UNUSUAL BRUISING OR BLEEDING  TENDERNESS IN MOUTH AND THROAT WITH OR WITHOUT PRESENCE OF ULCERS  *URINARY PROBLEMS  *BOWEL PROBLEMS  UNUSUAL RASH Items with * indicate a potential emergency and should be followed up as soon as possible.  Feel free to call the clinic you have any questions or concerns. The clinic phone number is [phone].  Carboplatin injection What is this medicine? CARBOPLATIN (KAR boe pla tin) is a chemotherapy drug. It targets fast dividing cells, like cancer cells, and causes these cells to die. This medicine is used to treat ovarian cancer and many other cancers. This medicine may be used for other purposes; ask your health care provider or pharmacist if you have questions. What should I tell my health care provider before I take this medicine? They need to know if you have any of these conditions: -blood disorders -hearing problems -kidney disease -recent or ongoing radiation therapy -an unusual or allergic reaction to carboplatin, cisplatin, other chemotherapy, other medicines, foods, dyes, or preservatives -pregnant or trying to get pregnant -breast-feeding How should I use this medicine? This drug is usually given as an infusion into a vein. It is administered in a hospital or clinic by a specially trained health care professional. Talk to your  pediatrician regarding the use of this medicine in children. Special care may be needed. Overdosage: If you think you have taken too much of this medicine contact a poison control center or emergency room at once. NOTE: This medicine is only for you. Do not share this medicine with others. What if I miss a dose? It is important not to miss a dose. Call your doctor or health care professional if you are unable to keep an appointment. What may interact with this medicine? -medicines for seizures -medicines to increase blood counts like filgrastim, pegfilgrastim, sargramostim -some antibiotics like amikacin, gentamicin, neomycin, streptomycin, tobramycin -vaccines Talk to your doctor or health care professional before taking any of these medicines: -acetaminophen -aspirin -ibuprofen -ketoprofen -naproxen This list may not describe all possible interactions. Give your health care provider a list of all the medicines, herbs, non-prescription drugs, or dietary supplements you use. Also tell them if you smoke, drink alcohol, or use illegal drugs. Some items may interact with your medicine. What should I watch for while using this medicine? Your condition will be monitored carefully while you are receiving this medicine. You will need important blood work done while you are taking this medicine. This drug may make you feel generally unwell. This is not uncommon, as chemotherapy can affect healthy cells as well as cancer cells. Report any side effects. Continue your course of treatment even though you feel ill unless your doctor tells you to stop. In some cases, you may be given additional medicines to help with side effects. Follow all directions for their use. Call your doctor or health care professional for advice if you get a fever, chills or sore  throat, or other symptoms of a cold or flu. Do not treat yourself. This drug decreases your body's ability to fight infections. Try to avoid being around  people who are sick. This medicine may increase your risk to bruise or bleed. Call your doctor or health care professional if you notice any unusual bleeding. Be careful brushing and flossing your teeth or using a toothpick because you may get an infection or bleed more easily. If you have any dental work done, tell your dentist you are receiving this medicine. Avoid taking products that contain aspirin, acetaminophen, ibuprofen, naproxen, or ketoprofen unless instructed by your doctor. These medicines may hide a fever. Do not become pregnant while taking this medicine. Women should inform their doctor if they wish to become pregnant or think they might be pregnant. There is a potential for serious side effects to an unborn child. Talk to your health care professional or pharmacist for more information. Do not breast-feed an infant while taking this medicine. What side effects may I notice from receiving this medicine? Side effects that you should report to your doctor or health care professional as soon as possible: -allergic reactions like skin rash, itching or hives, swelling of the face, lips, or tongue -signs of infection - fever or chills, cough, sore throat, pain or difficulty passing urine -signs of decreased platelets or bleeding - bruising, pinpoint red spots on the skin, black, tarry stools, nosebleeds -signs of decreased red blood cells - unusually weak or tired, fainting spells, lightheadedness -breathing problems -changes in hearing -changes in vision -chest pain -high blood pressure -low blood counts - This drug may decrease the number of white blood cells, red blood cells and platelets. You may be at increased risk for infections and bleeding. -nausea and vomiting -pain, swelling, redness or irritation at the injection site -pain, tingling, numbness in the hands or feet -problems with balance, talking, walking -trouble passing urine or change in the amount of urine Side effects  that usually do not require medical attention (report to your doctor or health care professional if they continue or are bothersome): -hair loss -loss of appetite -metallic taste in the mouth or changes in taste This list may not describe all possible side effects. Call your doctor for medical advice about side effects. You may report side effects to FDA at 1-800-FDA-1088. Where should I keep my medicine? This drug is given in a hospital or clinic and will not be stored at home. NOTE: This sheet is a summary. It may not cover all possible information. If you have questions about this medicine, talk to your doctor, pharmacist, or health care provider.  2012, [employer]. (01/05/2008 2:38:05 PM)   Etoposide, VP-16 injection What is this medicine? ETOPOSIDE, VP-16 (e toe POE side) is a chemotherapy drug. It is used to treat testicular cancer, lung cancer, and other cancers. This medicine may be used for other purposes; ask your health care provider or pharmacist if you have questions. What should I tell my health care provider before I take this medicine? They need to know if you have any of these conditions: -infection -kidney disease -low blood counts, like low white cell, platelet, or red cell counts -an unusual or allergic reaction to etoposide, other chemotherapeutic agents, other medicines, foods, dyes, or preservatives -pregnant or trying to get pregnant -breast-feeding How should I use this medicine? This medicine is for infusion into a vein. It is administered in a hospital or clinic by a specially trained health care professional.  Talk to your pediatrician regarding the use of this medicine in children. Special care may be needed. Overdosage: If you think you have taken too much of this medicine contact a poison control center or emergency room at once. NOTE: This medicine is only for you. Do not share this medicine with others. What if I miss a dose? It is important  not to miss your dose. Call your doctor or health care professional if you are unable to keep an appointment. What may interact with this medicine? -cyclosporine -medicines to increase blood counts like filgrastim, pegfilgrastim, sargramostim -vaccines This list may not describe all possible interactions. Give your health care provider a list of all the medicines, herbs, non-prescription drugs, or dietary supplements you use. Also tell them if you smoke, drink alcohol, or use illegal drugs. Some items may interact with your medicine. What should I watch for while using this medicine? Visit your doctor for checks on your progress. This drug may make you feel generally unwell. This is not uncommon, as chemotherapy can affect healthy cells as well as cancer cells. Report any side effects. Continue your course of treatment even though you feel ill unless your doctor tells you to stop. In some cases, you may be given additional medicines to help with side effects. Follow all directions for their use. Call your doctor or health care professional for advice if you get a fever, chills or sore throat, or other symptoms of a cold or flu. Do not treat yourself. This drug decreases your body's ability to fight infections. Try to avoid being around people who are sick. This medicine may increase your risk to bruise or bleed. Call your doctor or health care professional if you notice any unusual bleeding. Be careful brushing and flossing your teeth or using a toothpick because you may get an infection or bleed more easily. If you have any dental work done, tell your dentist you are receiving this medicine. Avoid taking products that contain aspirin, acetaminophen, ibuprofen, naproxen, or ketoprofen unless instructed by your doctor. These medicines may hide a fever. Do not become pregnant while taking this medicine. Women should inform their doctor if they wish to become pregnant or think they might be pregnant. There  is a potential for serious side effects to an unborn child. Talk to your health care professional or pharmacist for more information. Do not breast-feed an infant while taking this medicine. What side effects may I notice from receiving this medicine? Side effects that you should report to your doctor or health care professional as soon as possible: -allergic reactions like skin rash, itching or hives, swelling of the face, lips, or tongue -low blood counts - this medicine may decrease the number of white blood cells, red blood cells and platelets. You may be at increased risk for infections and bleeding. -signs of infection - fever or chills, cough, sore throat, pain or difficulty passing urine -signs of decreased platelets or bleeding - bruising, pinpoint red spots on the skin, black, tarry stools, blood in the urine -signs of decreased red blood cells - unusually weak or tired, fainting spells, lightheadedness -breathing problems -changes in vision -mouth or throat sores or ulcers -pain, redness, swelling or irritation at the injection site -pain, tingling, numbness in the hands or feet -redness, blistering, peeling or loosening of the skin, including inside the mouth -seizures -vomiting Side effects that usually do not require medical attention (report to your doctor or health care professional if they continue or are bothersome): -  diarrhea -hair loss -loss of appetite -nausea -stomach pain This list may not describe all possible side effects. Call your doctor for medical advice about side effects. You may report side effects to FDA at 1-800-FDA-1088. Where should I keep my medicine? This drug is given in a hospital or clinic and will not be stored at home. NOTE: This sheet is a summary. It may not cover all possible information. If you have questions about this medicine, talk to your doctor, pharmacist, or health care provider.  2012, [employer]. (02/01/2008 5:24:12 PM)

## 2013-07-20 ENCOUNTER — Ambulatory Visit (HOSPITAL_BASED_OUTPATIENT_CLINIC_OR_DEPARTMENT_OTHER): Payer: Medicare Other

## 2013-07-20 DIAGNOSIS — C787 Secondary malignant neoplasm of liver and intrahepatic bile duct: Secondary | ICD-10-CM

## 2013-07-20 DIAGNOSIS — C7B8 Other secondary neuroendocrine tumors: Secondary | ICD-10-CM

## 2013-07-20 DIAGNOSIS — Z5111 Encounter for antineoplastic chemotherapy: Secondary | ICD-10-CM

## 2013-07-20 DIAGNOSIS — C3492 Malignant neoplasm of unspecified part of left bronchus or lung: Secondary | ICD-10-CM

## 2013-07-20 DIAGNOSIS — C7A1 Malignant poorly differentiated neuroendocrine tumors: Secondary | ICD-10-CM

## 2013-07-20 MED ORDER — DEXAMETHASONE SODIUM PHOSPHATE 10 MG/ML IJ SOLN
INTRAMUSCULAR | Status: AC
  Filled 2013-07-20: qty 1

## 2013-07-20 MED ORDER — ONDANSETRON 8 MG/50ML IVPB (CHCC)
8.0000 mg | Freq: Once | INTRAVENOUS | Status: AC
  Administered 2013-07-20: 8 mg via INTRAVENOUS

## 2013-07-20 MED ORDER — SODIUM CHLORIDE 0.9 % IV SOLN
Freq: Once | INTRAVENOUS | Status: AC
  Administered 2013-07-20: 16:00:00 via INTRAVENOUS

## 2013-07-20 MED ORDER — ONDANSETRON 8 MG/NS 50 ML IVPB
INTRAVENOUS | Status: AC
  Filled 2013-07-20: qty 8

## 2013-07-20 MED ORDER — SODIUM CHLORIDE 0.9 % IV SOLN
120.0000 mg/m2 | Freq: Once | INTRAVENOUS | Status: AC
  Administered 2013-07-20: 200 mg via INTRAVENOUS
  Filled 2013-07-20: qty 10

## 2013-07-20 MED ORDER — DEXAMETHASONE SODIUM PHOSPHATE 10 MG/ML IJ SOLN
10.0000 mg | Freq: Once | INTRAMUSCULAR | Status: AC
  Administered 2013-07-20: 10 mg via INTRAVENOUS

## 2013-07-20 NOTE — Patient Instructions (Addendum)
[facility] Discharge Instructions for Patients Receiving Chemotherapy  Today you received the following chemotherapy agents VP-16  To help prevent nausea and vomiting after your treatment, we encourage you to take your nausea medication     If you develop nausea and vomiting that is not controlled by your nausea medication, call the clinic.   BELOW ARE SYMPTOMS THAT SHOULD BE REPORTED IMMEDIATELY:  *FEVER GREATER THAN 100.5 F  *CHILLS WITH OR WITHOUT FEVER  NAUSEA AND VOMITING THAT IS NOT CONTROLLED WITH YOUR NAUSEA MEDICATION  *UNUSUAL SHORTNESS OF BREATH  *UNUSUAL BRUISING OR BLEEDING  TENDERNESS IN MOUTH AND THROAT WITH OR WITHOUT PRESENCE OF ULCERS  *URINARY PROBLEMS  *BOWEL PROBLEMS  UNUSUAL RASH Items with * indicate a potential emergency and should be followed up as soon as possible.  Feel free to call the clinic you have any questions or concerns. The clinic phone number is [phone].    

## 2013-07-21 ENCOUNTER — Ambulatory Visit (HOSPITAL_BASED_OUTPATIENT_CLINIC_OR_DEPARTMENT_OTHER): Payer: Medicare Other

## 2013-07-21 VITALS — BP 159/79 | HR 80 | Temp 98.0°F

## 2013-07-21 DIAGNOSIS — Z5111 Encounter for antineoplastic chemotherapy: Secondary | ICD-10-CM

## 2013-07-21 DIAGNOSIS — C7A1 Malignant poorly differentiated neuroendocrine tumors: Secondary | ICD-10-CM

## 2013-07-21 DIAGNOSIS — C787 Secondary malignant neoplasm of liver and intrahepatic bile duct: Secondary | ICD-10-CM

## 2013-07-21 DIAGNOSIS — C3492 Malignant neoplasm of unspecified part of left bronchus or lung: Secondary | ICD-10-CM

## 2013-07-21 MED ORDER — ONDANSETRON 8 MG/50ML IVPB (CHCC)
8.0000 mg | Freq: Once | INTRAVENOUS | Status: AC
  Administered 2013-07-21: 8 mg via INTRAVENOUS

## 2013-07-21 MED ORDER — SODIUM CHLORIDE 0.9 % IV SOLN
120.0000 mg/m2 | Freq: Once | INTRAVENOUS | Status: AC
  Administered 2013-07-21: 200 mg via INTRAVENOUS
  Filled 2013-07-21: qty 10

## 2013-07-21 MED ORDER — SODIUM CHLORIDE 0.9 % IJ SOLN
10.0000 mL | INTRAMUSCULAR | Status: DC | PRN
  Filled 2013-07-21: qty 10

## 2013-07-21 MED ORDER — SODIUM CHLORIDE 0.9 % IV SOLN
Freq: Once | INTRAVENOUS | Status: AC
  Administered 2013-07-21: 16:00:00 via INTRAVENOUS

## 2013-07-21 MED ORDER — DEXAMETHASONE SODIUM PHOSPHATE 10 MG/ML IJ SOLN
INTRAMUSCULAR | Status: AC
  Filled 2013-07-21: qty 1

## 2013-07-21 MED ORDER — HEPARIN SOD (PORK) LOCK FLUSH 100 UNIT/ML IV SOLN
500.0000 [IU] | Freq: Once | INTRAVENOUS | Status: DC | PRN
  Filled 2013-07-21: qty 5

## 2013-07-21 MED ORDER — DEXAMETHASONE SODIUM PHOSPHATE 10 MG/ML IJ SOLN
10.0000 mg | Freq: Once | INTRAMUSCULAR | Status: AC
  Administered 2013-07-21: 10 mg via INTRAVENOUS

## 2013-07-21 MED ORDER — ONDANSETRON 8 MG/NS 50 ML IVPB
INTRAVENOUS | Status: AC
  Filled 2013-07-21: qty 8

## 2013-07-22 ENCOUNTER — Telehealth: Payer: Self-pay | Admitting: *Deleted

## 2013-07-22 ENCOUNTER — Ambulatory Visit (HOSPITAL_BASED_OUTPATIENT_CLINIC_OR_DEPARTMENT_OTHER): Payer: Medicare Other

## 2013-07-22 ENCOUNTER — Other Ambulatory Visit: Payer: Self-pay | Admitting: *Deleted

## 2013-07-22 VITALS — BP 157/76 | HR 69 | Temp 98.0°F | Wt 127.1 lb

## 2013-07-22 DIAGNOSIS — C787 Secondary malignant neoplasm of liver and intrahepatic bile duct: Secondary | ICD-10-CM

## 2013-07-22 DIAGNOSIS — C3492 Malignant neoplasm of unspecified part of left bronchus or lung: Secondary | ICD-10-CM

## 2013-07-22 DIAGNOSIS — C7A1 Malignant poorly differentiated neuroendocrine tumors: Secondary | ICD-10-CM

## 2013-07-22 MED ORDER — LIDOCAINE-PRILOCAINE 2.5-2.5 % EX CREA: TOPICAL_CREAM | CUTANEOUS | Status: DC | PRN

## 2013-07-22 MED ORDER — PEGFILGRASTIM INJECTION 6 MG/0.6ML
6.0000 mg | Freq: Once | SUBCUTANEOUS | Status: AC
  Administered 2013-07-22: 6 mg via SUBCUTANEOUS
  Filled 2013-07-22: qty 0.6

## 2013-07-22 NOTE — Progress Notes (Signed)
Pt has poor venous access, per Dr Donnald Garre, okay for pt to have port placed by IR.   SLJ

## 2013-07-22 NOTE — Patient Instructions (Signed)

## 2013-07-22 NOTE — Telephone Encounter (Signed)
Mrs Mago here for Neulasta injection following 1st carbo/vp chemotherapy.  States doing fine. No nausea, vomiting, or diarrhea.  Drinking plenty of fluids.  All questions answered.  Knows to call if she has problems, questions or concerns.   Is waiting for call from IR to setup port placement.

## 2013-07-26 ENCOUNTER — Telehealth: Payer: Self-pay | Admitting: *Deleted

## 2013-07-26 ENCOUNTER — Other Ambulatory Visit (HOSPITAL_BASED_OUTPATIENT_CLINIC_OR_DEPARTMENT_OTHER): Payer: Medicare Other | Admitting: Lab

## 2013-07-26 ENCOUNTER — Encounter: Payer: Self-pay | Admitting: Physician Assistant

## 2013-07-26 ENCOUNTER — Telehealth: Payer: Self-pay | Admitting: Internal Medicine

## 2013-07-26 ENCOUNTER — Ambulatory Visit (HOSPITAL_BASED_OUTPATIENT_CLINIC_OR_DEPARTMENT_OTHER): Payer: Medicare Other | Admitting: Physician Assistant

## 2013-07-26 VITALS — BP 128/60 | HR 73 | Temp 98.7°F | Resp 20 | Ht 67.0 in | Wt 124.6 lb

## 2013-07-26 DIAGNOSIS — C3490 Malignant neoplasm of unspecified part of unspecified bronchus or lung: Secondary | ICD-10-CM

## 2013-07-26 DIAGNOSIS — C7B8 Other secondary neuroendocrine tumors: Secondary | ICD-10-CM

## 2013-07-26 DIAGNOSIS — C787 Secondary malignant neoplasm of liver and intrahepatic bile duct: Secondary | ICD-10-CM

## 2013-07-26 DIAGNOSIS — C3492 Malignant neoplasm of unspecified part of left bronchus or lung: Secondary | ICD-10-CM

## 2013-07-26 DIAGNOSIS — D709 Neutropenia, unspecified: Secondary | ICD-10-CM

## 2013-07-26 DIAGNOSIS — C7A00 Malignant carcinoid tumor of unspecified site: Secondary | ICD-10-CM

## 2013-07-26 LAB — CBC WITH DIFFERENTIAL/PLATELET
BASO%: 1.4 % (ref 0.0–2.0)
EOS%: 2.3 % (ref 0.0–7.0)
Eosinophils Absolute: 0.1 10*3/uL (ref 0.0–0.5)
HGB: 11.2 g/dL — ABNORMAL LOW (ref 11.6–15.9)
LYMPH%: 58.1 % — ABNORMAL HIGH (ref 14.0–49.7)
MCH: 30.6 pg (ref 25.1–34.0)
MCHC: 34.7 g/dL (ref 31.5–36.0)
MCV: 88.3 fL (ref 79.5–101.0)
MONO%: 4.7 % (ref 0.0–14.0)
NEUT#: 0.7 10*3/uL — ABNORMAL LOW (ref 1.5–6.5)
NEUT%: 33.5 % — ABNORMAL LOW (ref 38.4–76.8)
Platelets: 88 10*3/uL — ABNORMAL LOW (ref 145–400)
RBC: 3.66 10*6/uL — ABNORMAL LOW (ref 3.70–5.45)
RDW: 13.1 % (ref 11.2–14.5)
nRBC: 0 % (ref 0–0)

## 2013-07-26 LAB — COMPREHENSIVE METABOLIC PANEL (CC13)
ALT: 11 U/L (ref 0–55)
AST: 15 U/L (ref 5–34)
Alkaline Phosphatase: 78 U/L (ref 40–150)
Anion Gap: 6 mEq/L (ref 3–11)
CO2: 22 mEq/L (ref 22–29)
Calcium: 9.3 mg/dL (ref 8.4–10.4)
Creatinine: 0.8 mg/dL (ref 0.6–1.1)
Sodium: 133 mEq/L — ABNORMAL LOW (ref 136–145)
Total Bilirubin: 0.93 mg/dL (ref 0.20–1.20)
Total Protein: 6.3 g/dL — ABNORMAL LOW (ref 6.4–8.3)

## 2013-07-26 NOTE — Telephone Encounter (Signed)
Per staff message and POF I have scheduled appts.  JMW  

## 2013-07-26 NOTE — Progress Notes (Addendum)
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   PRIOR THERAPY:  1) Status post left upper lobectomy with lymph node dissection.  2) Adjuvant systemic chemotherapy with cisplatin 75 mg region squared and Alimta 500 mg per meter squared given every 3 weeks for total of 4 cycles, status post 4 cycles. Last dose was given 03/02/2013.   CURRENT THERAPY: Systemic chemotherapy with carboplatin for AUC of 5 on day 1 and etoposide at 120 mg/M2 on days 1, 2 and 3 with Neulasta support on day 4 every 3 weeks. First dose of chemotherapy on 07/19/2013. Status post 1 cycle.  CHEMOTHERAPY INTENT: Palliative  CURRENT # OF CHEMOTHERAPY CYCLES: 1  CURRENT ANTIEMETICS: Zofran, dexamethasone and Compazine.  CURRENT SMOKING STATUS: Former smoker  ORAL CHEMOTHERAPY AND CONSENT: None  CURRENT BISPHOSPHONATES USE: None  PAIN MANAGEMENT: 0/10  NARCOTICS INDUCED CONSTIPATION: None  LIVING WILL AND CODE STATUS: ?    INTERVAL HISTORY: Sara Mcclure 67 y.o. female returns to the clinic today for followup visit accompanied by her husband. She presents for symptom management visit after completing her first cycle of systemic chemotherapy of carboplatin and etoposide with Neulasta support. She tolerated the first cycle relatively well with the exception of constipation. She states that she had gone 4 days with no bowel movement and had to take magnesium citrate with good results. Her appetite is fair.She denies any specific pain. She does have some congestion  that seems to settle in her throat she is unable to cough up. The patient is feeling fine today with no specific complaints except for shortness breath with exertion. She denied having any significant chest pain, cough or hemoptysis.  She has no weight loss or night sweats. The patient denied having any nausea or vomiting. She has no fever or chills.    MEDICAL HISTORY: Past Medical History  Diagnosis Date  . GERD (gastroesophageal reflux disease)   . Asthma   . Hypertension   . Diabetes mellitus   . Thyroid disease   . Anxiety   . Vertigo   . Breast cancer   . COPD (chronic obstructive pulmonary disease)   . PONV (postoperative nausea and vomiting)     ALLERGIES:  is allergic to metformin and related.  MEDICATIONS:  Current Outpatient Prescriptions  Medication Sig Dispense Refill  . albuterol (ACCUNEB) 1.25 MG/3ML nebulizer solution Take 1 ampule by nebulization 2 (two) times daily as needed. For shortness of breath      . albuterol (PROVENTIL HFA;VENTOLIN HFA) 108 (90 BASE) MCG/ACT inhaler Inhale 2 puffs into the lungs every 6 (six) hours as needed. For shortness of breath      . ALPRAZolam (XANAX) 0.25 MG tablet Take 0.25 mg by mouth 3 (three) times daily as needed for anxiety.       . Alum Hydroxide-Mag Carbonate (GAVISCON PO) Take 1 tablet by mouth daily as needed (stomach).      Marland Kitchen aspirin EC 81 MG tablet Take 81 mg by mouth daily.      . cetirizine (ZYRTEC) 10 MG tablet Take 10 mg by mouth daily as needed for  allergies.      . Cholecalciferol (VITAMIN D) 2000 UNITS tablet Take 2,000 Units by mouth 2 (two) times daily.      . Fluticasone-Salmeterol (ADVAIR) 250-50 MCG/DOSE AEPB Inhale 1 puff into the lungs every 12 (twelve) hours.      . fosinopril (MONOPRIL) 20 MG tablet Take 20 mg by mouth daily after breakfast.       . levothyroxine (SYNTHROID, LEVOTHROID) 50 MCG tablet Take 50 mcg by mouth daily before breakfast.       . lidocaine-prilocaine (EMLA) cream Apply topically as  needed. Apply to port 1 hour before chemotherapy.  30 g  0  . metoprolol tartrate (LOPRESSOR) 25 MG tablet Take 1 tablet (25 mg total) by mouth 2 (two) times daily.  60 tablet  1  . montelukast (SINGULAIR) 10 MG tablet Take 10 mg by mouth at bedtime.      . ondansetron (ZOFRAN-ODT) 8 MG disintegrating tablet 8 mg.      . prochlorperazine (COMPAZINE) 10 MG tablet Take 1 tablet (10 mg total) by mouth every 6 (six) hours as needed.  60 tablet  0   No current facility-administered medications for this visit.    SURGICAL HISTORY:  Past Surgical History  Procedure Laterality Date  . Cardiac catherization    . Breast lumpectomy    . Tubal ligation    . Video assisted thoracoscopy (vats)/wedge resection Left 11/19/2012    Procedure: VIDEO ASSISTED THORACOSCOPY (VATS)/WEDGE RESECTION;  Surgeon: Loreli Slot, MD;  Location: [facility] OR;  Service: Thoracic;  Laterality: Left;  left upper lobe wedge resection  . Lobectomy Left 11/19/2012    Procedure: LOBECTOMY;  Surgeon: Loreli Slot, MD;  Location: [facility] OR;  Service: Thoracic;  Laterality: Left;  left upper lobe  . Lymph node dissection Left 11/19/2012    Procedure: LYMPH NODE DISSECTION;  Surgeon: Loreli Slot, MD;  Location: [facility] OR;  Service: Thoracic;  Laterality: Left;    REVIEW OF SYSTEMS:  Constitutional: negative Eyes: negative Ears, nose, mouth, throat, and face: negative Respiratory: positive for dyspnea on exertion Cardiovascular: negative Gastrointestinal: positive for constipation Genitourinary:negative Integument/breast: negative Hematologic/lymphatic: negative Musculoskeletal:negative Neurological: negative Behavioral/Psych: negative Endocrine: negative Allergic/Immunologic: negative   PHYSICAL EXAMINATION: General appearance: alert, cooperative and no distress Head: Normocephalic, without obvious abnormality, atraumatic Neck: no adenopathy, no JVD, supple, symmetrical, trachea midline and thyroid not enlarged,  symmetric, no tenderness/mass/nodules Lymph nodes: Cervical, supraclavicular, and axillary nodes normal. Resp: clear to auscultation bilaterally Back: symmetric, no curvature. ROM normal. No CVA tenderness. Cardio: regular rate and rhythm, S1, S2 normal, no murmur, click, rub or gallop GI: soft, non-tender; bowel sounds normal; no masses,  no organomegaly Extremities: extremities normal, atraumatic, no cyanosis or edema Neurologic: Alert and oriented X 3, normal strength and tone. Normal symmetric reflexes. Normal coordination and gait  ECOG PERFORMANCE STATUS: 1 - Symptomatic but completely ambulatory  Blood pressure 128/60, pulse 73, temperature 98.7 F (37.1 C), temperature source Oral, resp. rate 20, height 5\' 7"  (1.702 m), weight 124 lb 9.6 oz (56.518 kg).  LABORATORY DATA: Lab Results  Component Value Date   WBC 2.2* 07/26/2013   HGB 11.2* 07/26/2013   HCT 32.3* 07/26/2013   MCV 88.3 07/26/2013   PLT 88* 07/26/2013      Chemistry      Component Value Date/Time   NA 133* 07/26/2013 1336   NA 140 11/21/2012 0445   K 4.2 07/26/2013 1336   K 3.8 11/21/2012 0445   CL 102 03/30/2013 0951  CL 105 11/21/2012 0445   CO2 22 07/26/2013 1336   CO2 27 11/21/2012 0445   BUN 22.1 07/26/2013 1336   BUN 6 11/21/2012 0445   CREATININE 0.8 07/26/2013 1336   CREATININE 0.55 11/21/2012 0445      Component Value Date/Time   CALCIUM 9.3 07/26/2013 1336   CALCIUM 9.3 11/21/2012 0445   ALKPHOS 78 07/26/2013 1336   ALKPHOS 65 11/21/2012 0445   AST 15 07/26/2013 1336   AST 15 11/21/2012 0445   ALT 11 07/26/2013 1336   ALT 16 11/21/2012 0445   BILITOT 0.93 07/26/2013 1336   BILITOT 0.4 11/21/2012 0445       RADIOGRAPHIC STUDIES: Ct Chest W Contrast  06/29/2013   CLINICAL DATA:  Lung cancer status post partial lung resection. History of breast cancer with lumpectomy in 1995. Chemotherapy completed 4 months ago.  EXAM: CT CHEST WITH CONTRAST  TECHNIQUE: Multidetector CT imaging of the chest was performed  during intravenous contrast administration.  CONTRAST:  80mL OMNIPAQUE IOHEXOL 300 MG/ML  SOLN  COMPARISON:  Chest CT 03/26/2013 and 11/03/2012. PET-CT 11/06/2012.  FINDINGS: There are stable postsurgical findings status post left upper lobe resection. Best seen on the reformatted images is increased soft tissue in the AP window worrisome for local recurrence or AP window adenopathy. This measures 1.8 cm short axis on coronal image 38. No other enlarged mediastinal or hilar lymph nodes are demonstrated.  There is a stable small left pleural effusion without associated pleural nodularity. There is no right pleural effusion or pericardial effusion. There is stable atherosclerosis of the aorta, great vessels and coronary arteries. There are stable postsurgical changes within the left breast and left axilla.  There are stable emphysematous changes throughout the lungs with mild biapical scarring. No discrete nodules are demonstrated currently.  Images through the upper abdomen demonstrate the interval development of multiple peripherally enhancing liver lesions consistent with metastatic disease. Representative lesions measure 1.8 cm in the right lobe on image 57 and 1.7 cm in the left lobe on image 61. A left adrenal nodule is stable.  There are no worrisome osseous findings. There is a stable central calcified disc protrusion at T6-7.  IMPRESSION: 1. 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  2. Interval development of increased soft tissue in the AP window worrisome for nodal metastasis or local recurrence of lung cancer.  3. No evidence of pulmonary metastasis. Stable chronic lung disease.  4. Stable left adrenal adenoma.   Electronically Signed   By: Roxy Horseman   On: 06/29/2013 13:41   Mr Laqueta Jean Wo Contrast  07/10/2013   CLINICAL DATA:  67 year old female with lung cancer. Progressive disease on recent chest CT. Restaging.  EXAM: MRI HEAD WITHOUT AND WITH CONTRAST  TECHNIQUE: Multiplanar,  multiecho pulse sequences of the brain and surrounding structures were obtained according to standard protocol without and with intravenous contrast  CONTRAST:  12mL MULTIHANCE GADOBENATE DIMEGLUMINE 529 MG/ML IV SOLN  COMPARISON:  Brain MRI 01/09/2013.  FINDINGS: Stable cerebral volume. Stable post-contrast appearance of the brain, with no definite abnormal intracranial enhancement (ACA artery ectasia suspected on series 13, image 49 and unchanged).  No midline shift, mass effect, or evidence of intracranial mass lesion.  No restricted diffusion or evidence of acute infarction. No ventriculomegaly. No acute intracranial hemorrhage identified. Negative pituitary and cervicomedullary junction. Grossly negative visualized cervical spine. Bone marrow signal is stable and within normal limits.  Stable minimal to mild for age nonspecific occasional cerebral white matter T2  and FLAIR hyperintense foci. No new signal abnormality. Major intracranial vascular flow voids are stable.  Visualized orbit soft tissues are within normal limits. Improved paranasal sinus aeration. Mastoids are clear. Negative scalp soft tissues.  IMPRESSION: Stable and negative MRI appearance of the brain. No acute or metastatic intracranial abnormality.   Electronically Signed   By: Augusto Gamble M.D.   On: 07/10/2013 15:02   Nm Pet Image Restag (ps) Skull Base To Thigh  07/12/2013   ADDENDUM REPORT: 07/12/2013 10:18  ADDENDUM: Omitted from the body of the report: Stable 13 mm left adrenal adenoma (series 2/image 131), non-FDG-avid.  The remainder of the report, including the impression, is unchanged.   Electronically Signed   By: Charline Bills M.D.   On: 07/12/2013 10:18   07/12/2013   CLINICAL DATA:  Subsequent treatment strategy for left upper lobe lung cancer status post left lobectomy. New liver metastasis. Last chemotherapy 02/2013. History of left breast cancer status post lumpectomy.  EXAM: NUCLEAR MEDICINE PET SKULL BASE TO THIGH   FASTING BLOOD GLUCOSE:  Value:  98 mg/dl  TECHNIQUE: 16.1 mCi W-96 FDG was injected intravenously. CT data was obtained and used for attenuation correction and anatomic localization only. (This was not acquired as a diagnostic CT examination.) Additional exam technical data entered on technologist worksheet.  COMPARISON:  CT chest dated 06/29/2013. PET-CT dated 11/06/2012.  FINDINGS: NECK  No hypermetabolic lymph nodes in the neck.  CHEST  Status post left upper lobectomy with associated volume loss.  No new/suspicious pulmonary nodules. Moderate paraseptal emphysematous changes. Small left pleural effusion. No pneumothorax.  Nodal metastases in the left AP window (series 2/images 70 and 76), better visualized on prior CT chest, max SUV 9.7. 11 x 15 mm left prevascular node (series 2/ image 84), max SUV 6.7. Two extra-pleural lymph nodes at the left costovertebral junction measuring 12 mm (series 2/image 93) and 9 mm (series 2/image 118), max SUV 7.3.  Postsurgical changes in the lower left breast with surgical clips in the left axilla.  ABDOMEN/PELVIS  At least five hepatic metastases. Two representative lesions in the anterior segment right hepatic lobe measure 19 x 24 mm (series 2/image 129) and 25 x 22 mm (series 2/image 135), max SUV 9.7. Additional lesions are present in the lateral segment left hepatic lobe (series 2/image 118) and medial segment left hepatic lobe (series 2/image 133). Possible lesion in the right hepatic dome (series 2/image 116), although without definite hypermetabolism on the current study.  No abnormal hypermetabolic activity within the pancreas, adrenal glands, or spleen.  No hypermetabolic lymph nodes in the abdomen or pelvis.  Focal hypermetabolism in the region of the rectum (PET image 226), nonspecific.  SKELETON  10 mm lytic metastasis in the left iliac bone (series 2/image 195), max SUV 5.6.  IMPRESSION: Status post left upper lobectomy.  Prevascular, AP window, and extra-pleural  nodal metastases, max SUV 9.7.  Small left pleural effusion.  At least 5 hepatic metastases, as described above, max SUV 9.7.  Lytic metastasis in the left iliac bone, max SUV 5.6.  Electronically Signed: By: Charline Bills M.D. On: 07/12/2013 10:10   [place] Core Biopsy  07/09/2013   *RADIOLOGY REPORT*  Indication: History of lung and breast cancer, now with development of multiple hepatic lesions worrisome for metastatic disease  ULTRASOUND GUIDED LIVER LESION BIOPSY  Comparison: Chest CT - 06/29/2013; 03/26/2013  Intravenous medications: Fentanyl 100 mcg IV; Versed 2 mg IV  Total Moderate Sedation time: 20 minutes  Complications:  None immediate  Procedure:  Informed written consent was obtained from the patient after a discussion of the risks, benefits and alternatives to treatment. The patient understands and consents the procedure.  A timeout was performed prior to the initiation of the procedure.  Ultrasound scanning was performed of the right upper abdominal quadrant demonstrates several mixed echogenic nodules within the right lobe of the liver compatible with the findings on recently performed chest CT.  The dominant approximately 2.4 x 1.6 cm lesion adjacent to the gallbladder fossa was targeted for biopsy.  The right upper abdominal quadrant was prepped and draped in the usual sterile fashion. After the patient with sedated, initial trajectory was aborted secondary to interposition of the gallbladder.  As such, a more cranial, intercostal approach was necessary.  The overlying soft tissues were anesthetized with 1% lidocaine with epinephrine.  A 17 gauge, 6.8 cm co-axial needle was advanced into a peripheral aspect of the lesion and  3 core biopsies with an 18 gauge core device under direct ultrasound guidance.  The co-axial needle was removed and hemostasis was obtained with manual compression.  Post procedural scanning was negative for definitive area of hemorrhage or additional complication.  A  dressing was placed.  The patient tolerated the procedure well without immediate post procedural complication.  Impression:  Technically successful ultrasound guided core needle biopsy of dominant mixed echogenic solid nodule adjacent to the gallbladder fossa.   Original Report Authenticated By: Tacey Ruiz, MD    ASSESSMENT AND PLAN: This is a very pleasant 67 years old white female recently diagnosed with neuroendocrine carcinoma, small cell with multiple liver metastases as well as left AP window lymphadenopathy, extrapleural nodal metastasis and left iliac bone lesion. The patient has no evidence for metastatic disease to the brain.  She has a history of neuroendocrine carcinoma, large cell type diagnosed as stage II in February of 2014 status post left upper lobectomy followed by 4 cycles of adjuvant chemotherapy with cisplatin and etoposide. She's currently being treated with systemic chemotherapy in the form of carboplatin for an AUC of 5 given on day 1 and etoposide at 120 mg router squared on days 1, 2 and 3 with Neulasta support given on day 4, status post 1 cycle. This chemotherapy is given once every 3 weeks. Patient was discussed with an also seen by Dr. Arbutus Ped. Overall she tolerated his first cycle relatively well with the exception of some constipation. The constipation was ultimately resolved with magnesium citrate. She had a normal bowel movement this morning. For her shortness of breath and congestion was last patient take Mucinex and drink plenty of water to help thin his secretions so that when she does cough she is able to cough with secretions out. She'll continue with weekly labs and followup in 2 weeks prior to the start of cycle #2. She is slightly neutropenic today with ANC of 0.2 and a total white count of 2.2, platelets were 80,000 hemoglobin was in within normal range 11.2. Patient was a advised regarding neutropenic precautions and both she and her husband voiced  understanding.  Laural Benes, Deshae Dickison E, PA-C   She was advised to call immediately if she has any concerning symptoms in the interval.  The patient voices understanding of current disease status and treatment options and is in agreement with the current care plan.  All questions were answered. The patient knows to call the clinic with any problems, questions or concerns. We can certainly see the patient much sooner if necessary.  ADDENDUM: Hematology/Oncology Attending: I had a face to face encounter with the patient today. I recommended her care plan. She is a very pleasant 67 years old white female who was recently diagnosed with small cell lung cancer and currently undergoing systemic chemotherapy with carboplatin and etoposide status post 1 cycle. First cycle was given last week. The patient is feeling fine and tolerated the first week of her treatment fairly well except for pancytopenia. The patient received Neulasta injection after her chemotherapy. We advised her with neutropenic precautions and recommended for the patient to call immediately if she has any fever or chills. She would come back for follow up visit in 2 weeks with the start of cycle #2. Lajuana Matte., MD 07/26/2013

## 2013-07-26 NOTE — Telephone Encounter (Signed)
Gave pt appt for lab,md and chemo forOctober and November 2014 °

## 2013-07-26 NOTE — Patient Instructions (Signed)
Call immediately if you develop fever or chills Continue weekly labs as scheduled Follow up in 2 weeks, prior to the start of your next cycle of chemotherapy

## 2013-07-28 ENCOUNTER — Encounter (HOSPITAL_COMMUNITY): Payer: Self-pay | Admitting: Pharmacy Technician

## 2013-07-29 ENCOUNTER — Other Ambulatory Visit: Payer: Self-pay | Admitting: Radiology

## 2013-08-02 ENCOUNTER — Other Ambulatory Visit (HOSPITAL_BASED_OUTPATIENT_CLINIC_OR_DEPARTMENT_OTHER): Payer: Medicare Other

## 2013-08-02 DIAGNOSIS — C3492 Malignant neoplasm of unspecified part of left bronchus or lung: Secondary | ICD-10-CM

## 2013-08-02 DIAGNOSIS — C7A00 Malignant carcinoid tumor of unspecified site: Secondary | ICD-10-CM

## 2013-08-02 LAB — CBC WITH DIFFERENTIAL/PLATELET
BASO%: 1.1 % (ref 0.0–2.0)
EOS%: 0.5 % (ref 0.0–7.0)
Eosinophils Absolute: 0 10*3/uL (ref 0.0–0.5)
HGB: 10.6 g/dL — ABNORMAL LOW (ref 11.6–15.9)
LYMPH%: 24.8 % (ref 14.0–49.7)
MCH: 30.4 pg (ref 25.1–34.0)
MCHC: 34 g/dL (ref 31.5–36.0)
MCV: 89.2 fL (ref 79.5–101.0)
MONO%: 15.8 % — ABNORMAL HIGH (ref 0.0–14.0)
Platelets: 30 10*3/uL — ABNORMAL LOW (ref 145–400)
RBC: 3.5 10*6/uL — ABNORMAL LOW (ref 3.70–5.45)
lymph#: 2.3 10*3/uL (ref 0.9–3.3)

## 2013-08-02 LAB — COMPREHENSIVE METABOLIC PANEL (CC13)
ALT: 11 U/L (ref 0–55)
AST: 14 U/L (ref 5–34)
Anion Gap: 9 mEq/L (ref 3–11)
BUN: 9.2 mg/dL (ref 7.0–26.0)
CO2: 23 mEq/L (ref 22–29)
Calcium: 9.8 mg/dL (ref 8.4–10.4)
Chloride: 104 mEq/L (ref 98–109)
Creatinine: 0.9 mg/dL (ref 0.6–1.1)
Total Bilirubin: 0.27 mg/dL (ref 0.20–1.20)

## 2013-08-05 ENCOUNTER — Ambulatory Visit (HOSPITAL_COMMUNITY)
Admission: RE | Admit: 2013-08-05 | Discharge: 2013-08-05 | Disposition: A | Discharge status: Home / Self Care | Payer: Medicare Other | Source: Ambulatory Visit | Attending: Internal Medicine | Admitting: Internal Medicine

## 2013-08-05 ENCOUNTER — Other Ambulatory Visit: Payer: Self-pay | Admitting: Internal Medicine

## 2013-08-05 ENCOUNTER — Encounter (HOSPITAL_COMMUNITY): Payer: Self-pay

## 2013-08-05 DIAGNOSIS — J45909 Unspecified asthma, uncomplicated: Secondary | ICD-10-CM | POA: Insufficient documentation

## 2013-08-05 DIAGNOSIS — I10 Essential (primary) hypertension: Secondary | ICD-10-CM | POA: Insufficient documentation

## 2013-08-05 DIAGNOSIS — Z79899 Other long term (current) drug therapy: Secondary | ICD-10-CM | POA: Insufficient documentation

## 2013-08-05 DIAGNOSIS — E119 Type 2 diabetes mellitus without complications: Secondary | ICD-10-CM | POA: Insufficient documentation

## 2013-08-05 DIAGNOSIS — C7A1 Malignant poorly differentiated neuroendocrine tumors: Secondary | ICD-10-CM | POA: Insufficient documentation

## 2013-08-05 DIAGNOSIS — Z853 Personal history of malignant neoplasm of breast: Secondary | ICD-10-CM | POA: Insufficient documentation

## 2013-08-05 DIAGNOSIS — J438 Other emphysema: Secondary | ICD-10-CM | POA: Insufficient documentation

## 2013-08-05 DIAGNOSIS — C787 Secondary malignant neoplasm of liver and intrahepatic bile duct: Secondary | ICD-10-CM | POA: Insufficient documentation

## 2013-08-05 LAB — CBC
Hemoglobin: 11.3 g/dL — ABNORMAL LOW (ref 12.0–15.0)
MCH: 30.5 pg (ref 26.0–34.0)
MCHC: 34.7 g/dL (ref 30.0–36.0)
Platelets: 82 10*3/uL — ABNORMAL LOW (ref 150–400)

## 2013-08-05 LAB — BASIC METABOLIC PANEL
BUN: 13 mg/dL (ref 6–23)
Calcium: 10.8 mg/dL — ABNORMAL HIGH (ref 8.4–10.5)
GFR calc non Af Amer: 58 mL/min — ABNORMAL LOW (ref >90)
Glucose, Bld: 90 mg/dL (ref 70–99)

## 2013-08-05 LAB — GLUCOSE, CAPILLARY: Glucose-Capillary: 83 mg/dL (ref 70–99)

## 2013-08-05 LAB — PROTIME-INR: Prothrombin Time: 12.4 seconds (ref 11.6–15.2)

## 2013-08-05 MED ORDER — FENTANYL CITRATE 0.05 MG/ML IJ SOLN
INTRAMUSCULAR | Status: AC | PRN
  Administered 2013-08-05: 100 ug via INTRAVENOUS

## 2013-08-05 MED ORDER — FENTANYL CITRATE 0.05 MG/ML IJ SOLN
INTRAMUSCULAR | Status: AC
  Filled 2013-08-05: qty 4

## 2013-08-05 MED ORDER — CEFAZOLIN SODIUM-DEXTROSE 2-3 GM-% IV SOLR
INTRAVENOUS | Status: AC
  Filled 2013-08-05: qty 50

## 2013-08-05 MED ORDER — CEFAZOLIN SODIUM-DEXTROSE 2-3 GM-% IV SOLR
2.0000 g | Freq: Once | INTRAVENOUS | Status: AC
  Administered 2013-08-05: 2 g via INTRAVENOUS

## 2013-08-05 MED ORDER — MIDAZOLAM HCL 2 MG/2ML IJ SOLN
INTRAMUSCULAR | Status: AC | PRN
  Administered 2013-08-05: 2 mg via INTRAVENOUS

## 2013-08-05 MED ORDER — HEPARIN SOD (PORK) LOCK FLUSH 100 UNIT/ML IV SOLN
500.0000 [IU] | Freq: Once | INTRAVENOUS | Status: AC
  Administered 2013-08-05: 500 [IU] via INTRAVENOUS

## 2013-08-05 MED ORDER — MIDAZOLAM HCL 2 MG/2ML IJ SOLN
INTRAMUSCULAR | Status: AC
  Filled 2013-08-05: qty 4

## 2013-08-05 MED ORDER — SODIUM CHLORIDE 0.9 % IV SOLN
Freq: Once | INTRAVENOUS | Status: AC
  Administered 2013-08-05: 12:00:00 via INTRAVENOUS

## 2013-08-05 NOTE — Procedures (Signed)
Successful placement of right IJ approach port-a-cath with tip at the superior caval atrial junction. The catheter is ready for immediate use. No immediate post procedural complications. 

## 2013-08-05 NOTE — H&P (Signed)
Sara Mcclure is an 67 y.o. female.   Chief Complaint: "I'm here for a port" HPI: Patient with remote history of left breast carcinoma and most recently NSC lung carcinoma, high grade poorly diff neuroendocrine carcinoma, s/p LUL lobectomy with LN dissection 11/2012. Also s/p + liver lesion biopsy. Now scheduled for port placement. Has been receiving chemo, but having IV access trouble. Now needs port. PMhx and meds reviewed. PLTs and WBC dropped secondary to treatment.  Pt has received Neulasta and Decadron as well as part of her treatment.  Past Medical History  Diagnosis Date  . GERD (gastroesophageal reflux disease)   . Asthma   . Hypertension   . Diabetes mellitus   . Thyroid disease   . Anxiety   . Vertigo   . Breast cancer   . COPD (chronic obstructive pulmonary disease)   . PONV (postoperative nausea and vomiting)     Past Surgical History  Procedure Laterality Date  . Cardiac catherization    . Breast lumpectomy    . Tubal ligation    . Video assisted thoracoscopy (vats)/wedge resection Left 11/19/2012    Procedure: VIDEO ASSISTED THORACOSCOPY (VATS)/WEDGE RESECTION;  Surgeon: Loreli Slot, MD;  Location: [facility] OR;  Service: Thoracic;  Laterality: Left;  left upper lobe wedge resection  . Lobectomy Left 11/19/2012    Procedure: LOBECTOMY;  Surgeon: Loreli Slot, MD;  Location: [facility] OR;  Service: Thoracic;  Laterality: Left;  left upper lobe  . Lymph node dissection Left 11/19/2012    Procedure: LYMPH NODE DISSECTION;  Surgeon: Loreli Slot, MD;  Location: [facility] OR;  Service: Thoracic;  Laterality: Left;    Family History  Problem Relation Age of Onset  . Cancer Mother   . COPD Mother   . Hypertension Mother   . COPD Father   . COPD Sister   . Cancer Sister   . COPD Brother   . Diabetes Brother   . Hyperlipidemia Brother   . Hypertension Brother   . Stroke Brother    Social History:  reports that she has been smoking Cigarettes.  She has a 50  pack-year smoking history. She does not have any smokeless tobacco history on file. She reports that she does not drink alcohol or use illicit drugs.  Allergies:  Allergies  Allergen Reactions  . Metformin And Related Nausea Only    Current outpatient prescriptions:albuterol (PROVENTIL HFA;VENTOLIN HFA) 108 (90 BASE) MCG/ACT inhaler, Inhale 2 puffs into the lungs every 6 (six) hours as needed. For shortness of breath, Disp: , Rfl: ;  Alum Hydroxide-Mag Carbonate (GAVISCON PO), Take 1 tablet by mouth daily as needed (stomach)., Disp: , Rfl: ;  aspirin EC 81 MG tablet, Take 81 mg by mouth every morning. , Disp: , Rfl:  Cholecalciferol (VITAMIN D) 2000 UNITS tablet, Take 2,000 Units by mouth daily with lunch. , Disp: , Rfl: ;  Fluticasone-Salmeterol (ADVAIR) 250-50 MCG/DOSE AEPB, Inhale 1 puff into the lungs every 12 (twelve) hours., Disp: , Rfl: ;  fosinopril (MONOPRIL) 20 MG tablet, Take 20 mg by mouth daily after breakfast. , Disp: , Rfl: ;  levothyroxine (SYNTHROID, LEVOTHROID) 50 MCG tablet, Take 50 mcg by mouth daily before breakfast. , Disp: , Rfl:  metoprolol tartrate (LOPRESSOR) 25 MG tablet, Take 1 tablet (25 mg total) by mouth 2 (two) times daily., Disp: 60 tablet, Rfl: 1;  montelukast (SINGULAIR) 10 MG tablet, Take 10 mg by mouth at bedtime., Disp: , Rfl: ;  Polyvinyl Alcohol-Povidone (REFRESH OP), Place  2 drops into both eyes daily as needed (For watery eyes.)., Disp: , Rfl:  PRESCRIPTION MEDICATION, She receives her treatments at the [facility] with Dr. Arbutus Ped. She received a 3 day cycle from 07/19/13 to 07/21/13 of Etoposide 200mg  and then an injection of Neulasta 6mg  on 07/22/13., Disp: , Rfl: ;  albuterol (ACCUNEB) 1.25 MG/3ML nebulizer solution, Take 1 ampule by nebulization 2 (two) times daily as needed. For shortness of breath, Disp: , Rfl:  ALPRAZolam (XANAX) 0.25 MG tablet, Take 0.25 mg by mouth 3 (three) times daily as needed for anxiety. , Disp: , Rfl: ;  cetirizine  (ZYRTEC) 10 MG tablet, Take 10 mg by mouth daily as needed for allergies., Disp: , Rfl: ;  ibuprofen (ADVIL,MOTRIN) 200 MG tablet, Take 200 mg by mouth every 8 (eight) hours as needed for pain., Disp: , Rfl:  lidocaine-prilocaine (EMLA) cream, Apply topically as needed. Apply to port 1 hour before chemotherapy., Disp: 30 g, Rfl: 0;  ondansetron (ZOFRAN-ODT) 8 MG disintegrating tablet, Take 8 mg by mouth every 8 (eight) hours as needed for nausea. , Disp: , Rfl: ;  prochlorperazine (COMPAZINE) 10 MG tablet, Take 10 mg by mouth every 6 (six) hours as needed (For nausea.)., Disp: , Rfl:  Current facility-administered medications:ceFAZolin (ANCEF) IVPB 2 g/50 mL premix, 2 g, Intravenous, Once, Robet Leu, PA-C   Results for orders placed during the hospital encounter of 08/05/13 (from the past 48 hour(s))  APTT     Status: None   Collection Time    08/05/13 11:32 AM      Result Value Range   aPTT 31  24 - 37 seconds  BASIC METABOLIC PANEL     Status: Abnormal   Collection Time    08/05/13 11:32 AM      Result Value Range   Sodium 135  135 - 145 mEq/L   Potassium 4.0  3.5 - 5.1 mEq/L   Chloride 101  96 - 112 mEq/L   CO2 23  19 - 32 mEq/L   Glucose, Bld 90  70 - 99 mg/dL   BUN 13  6 - 23 mg/dL   Creatinine, Ser 1.61  0.50 - 1.10 mg/dL   Calcium 09.6 (*) 8.4 - 10.5 mg/dL   GFR calc non Af Amer 58 (*) >90 mL/min   GFR calc Af Amer 67 (*) >90 mL/min   Comment: (NOTE)     The eGFR has been calculated using the CKD EPI equation.     This calculation has not been validated in all clinical situations.     eGFR's persistently <90 mL/min signify possible Chronic Kidney     Disease.  CBC     Status: Abnormal   Collection Time    08/05/13 11:32 AM      Result Value Range   WBC 14.5 (*) 4.0 - 10.5 K/uL   RBC 3.70 (*) 3.87 - 5.11 MIL/uL   Hemoglobin 11.3 (*) 12.0 - 15.0 g/dL   HCT 04.5 (*) 40.9 - 81.1 %   MCV 88.1  78.0 - 100.0 fL   MCH 30.5  26.0 - 34.0 pg   MCHC 34.7  30.0 - 36.0 g/dL    RDW 91.4  78.2 - 95.6 %   Platelets 82 (*) 150 - 400 K/uL   Comment: SPECIMEN CHECKED FOR CLOTS     PLATELET COUNT CONFIRMED BY SMEAR  PROTIME-INR     Status: None   Collection Time    08/05/13 11:32 AM  Result Value Range   Prothrombin Time 12.4  11.6 - 15.2 seconds   INR 0.94  0.00 - 1.49   No results found.  Review of Systems  Constitutional: Positive for malaise/fatigue. Negative for fever and chills.  HENT: Positive for hearing loss.   Respiratory: Positive for cough and shortness of breath. Negative for hemoptysis.   Cardiovascular: Negative for chest pain.  Gastrointestinal: Negative for vomiting and abdominal pain.       Occ nausea  Musculoskeletal: Positive for back pain.  Neurological: Negative for headaches.   Vitals:  BP 159/77  HR 66  R 16  TEMP 98.4  O2 SATS 97% RA Physical Exam  Constitutional: She is oriented to person, place, and time. She appears well-developed and well-nourished.  Cardiovascular: Normal rate and regular rhythm.   Respiratory: Effort normal.  Few insp wheezes/rhonchi  GI: Soft. Bowel sounds are normal. There is no tenderness.  Musculoskeletal: Normal range of motion. She exhibits no edema.  Neurological: She is alert and oriented to person, place, and time.     Assessment/Plan Metastatic lung cancer For Port placement to cont chemotherapy. Explained procedure, risks, complications, use of sedation. Labs reviewed. WBC elevated likely form Neulasta and Decadron PLTs have rebounded to 82k, up from 30k a few days ago. Consent signed in chart  Brayton El 08/05/2013, 12:59 PM

## 2013-08-09 ENCOUNTER — Ambulatory Visit (HOSPITAL_BASED_OUTPATIENT_CLINIC_OR_DEPARTMENT_OTHER): Payer: Medicare Other

## 2013-08-09 ENCOUNTER — Telehealth: Payer: Self-pay | Admitting: Internal Medicine

## 2013-08-09 ENCOUNTER — Encounter: Payer: Self-pay | Admitting: Physician Assistant

## 2013-08-09 ENCOUNTER — Telehealth: Payer: Self-pay | Admitting: *Deleted

## 2013-08-09 ENCOUNTER — Other Ambulatory Visit (HOSPITAL_BASED_OUTPATIENT_CLINIC_OR_DEPARTMENT_OTHER): Payer: Medicare Other | Admitting: Lab

## 2013-08-09 ENCOUNTER — Ambulatory Visit (HOSPITAL_BASED_OUTPATIENT_CLINIC_OR_DEPARTMENT_OTHER): Payer: Medicare Other | Admitting: Physician Assistant

## 2013-08-09 VITALS — BP 134/49 | HR 71 | Temp 99.8°F | Resp 18 | Ht 67.0 in | Wt 126.5 lb

## 2013-08-09 DIAGNOSIS — C3492 Malignant neoplasm of unspecified part of left bronchus or lung: Secondary | ICD-10-CM

## 2013-08-09 DIAGNOSIS — C787 Secondary malignant neoplasm of liver and intrahepatic bile duct: Secondary | ICD-10-CM

## 2013-08-09 DIAGNOSIS — Z23 Encounter for immunization: Secondary | ICD-10-CM

## 2013-08-09 DIAGNOSIS — C7A098 Malignant carcinoid tumors of other sites: Secondary | ICD-10-CM

## 2013-08-09 DIAGNOSIS — C7A1 Malignant poorly differentiated neuroendocrine tumors: Secondary | ICD-10-CM

## 2013-08-09 DIAGNOSIS — C7B8 Other secondary neuroendocrine tumors: Secondary | ICD-10-CM

## 2013-08-09 DIAGNOSIS — C3490 Malignant neoplasm of unspecified part of unspecified bronchus or lung: Secondary | ICD-10-CM

## 2013-08-09 DIAGNOSIS — Z5111 Encounter for antineoplastic chemotherapy: Secondary | ICD-10-CM

## 2013-08-09 LAB — COMPREHENSIVE METABOLIC PANEL (CC13)
AST: 18 U/L (ref 5–34)
Anion Gap: 8 mEq/L (ref 3–11)
BUN: 10.3 mg/dL (ref 7.0–26.0)
CO2: 23 mEq/L (ref 22–29)
Calcium: 9.8 mg/dL (ref 8.4–10.4)
Chloride: 107 mEq/L (ref 98–109)
Creatinine: 1 mg/dL (ref 0.6–1.1)
Glucose: 83 mg/dl (ref 70–140)
Total Bilirubin: 0.29 mg/dL (ref 0.20–1.20)

## 2013-08-09 LAB — CBC WITH DIFFERENTIAL/PLATELET
BASO%: 0.5 % (ref 0.0–2.0)
Basophils Absolute: 0.1 10*3/uL (ref 0.0–0.1)
EOS%: 0.2 % (ref 0.0–7.0)
HCT: 28.8 % — ABNORMAL LOW (ref 34.8–46.6)
LYMPH%: 17.8 % (ref 14.0–49.7)
MCH: 29.8 pg (ref 25.1–34.0)
MCHC: 34 g/dL (ref 31.5–36.0)
MONO#: 1.6 10*3/uL — ABNORMAL HIGH (ref 0.1–0.9)
NEUT%: 69.6 % (ref 38.4–76.8)
Platelets: 217 10*3/uL (ref 145–400)
WBC: 13.3 10*3/uL — ABNORMAL HIGH (ref 3.9–10.3)

## 2013-08-09 MED ORDER — DEXAMETHASONE SODIUM PHOSPHATE 20 MG/5ML IJ SOLN
INTRAMUSCULAR | Status: AC
  Filled 2013-08-09: qty 5

## 2013-08-09 MED ORDER — SODIUM CHLORIDE 0.9 % IJ SOLN
10.0000 mL | INTRAMUSCULAR | Status: DC | PRN
  Administered 2013-08-09: 10 mL
  Filled 2013-08-09: qty 10

## 2013-08-09 MED ORDER — HEPARIN SOD (PORK) LOCK FLUSH 100 UNIT/ML IV SOLN
500.0000 [IU] | Freq: Once | INTRAVENOUS | Status: AC | PRN
  Administered 2013-08-09: 500 [IU]
  Filled 2013-08-09: qty 5

## 2013-08-09 MED ORDER — ONDANSETRON 16 MG/50ML IVPB (CHCC)
INTRAVENOUS | Status: AC
  Filled 2013-08-09: qty 16

## 2013-08-09 MED ORDER — INFLUENZA VAC SPLIT QUAD 0.5 ML IM SUSP
0.5000 mL | Freq: Once | INTRAMUSCULAR | Status: AC
  Administered 2013-08-09: 0.5 mL via INTRAMUSCULAR
  Filled 2013-08-09: qty 0.5

## 2013-08-09 MED ORDER — SODIUM CHLORIDE 0.9 % IV SOLN
Freq: Once | INTRAVENOUS | Status: AC
  Administered 2013-08-09: 14:00:00 via INTRAVENOUS

## 2013-08-09 MED ORDER — SODIUM CHLORIDE 0.9 % IV SOLN
120.0000 mg/m2 | Freq: Once | INTRAVENOUS | Status: AC
  Administered 2013-08-09: 200 mg via INTRAVENOUS
  Filled 2013-08-09: qty 10

## 2013-08-09 MED ORDER — SODIUM CHLORIDE 0.9 % IV SOLN
372.0000 mg | Freq: Once | INTRAVENOUS | Status: AC
  Administered 2013-08-09: 370 mg via INTRAVENOUS
  Filled 2013-08-09: qty 37

## 2013-08-09 MED ORDER — DEXAMETHASONE SODIUM PHOSPHATE 20 MG/5ML IJ SOLN
20.0000 mg | Freq: Once | INTRAMUSCULAR | Status: AC
  Administered 2013-08-09: 20 mg via INTRAVENOUS

## 2013-08-09 MED ORDER — ONDANSETRON 16 MG/50ML IVPB (CHCC)
16.0000 mg | Freq: Once | INTRAVENOUS | Status: AC
  Administered 2013-08-09: 16 mg via INTRAVENOUS

## 2013-08-09 NOTE — Telephone Encounter (Signed)
Gave pt appt for lab, md and chemo for November and October 2014

## 2013-08-09 NOTE — Progress Notes (Signed)
Discharged at 1711, ambulatory to home in no distress.  To return tomorrow for Day 2.

## 2013-08-09 NOTE — Patient Instructions (Signed)
[facility] Discharge Instructions for Patients Receiving Chemotherapy  Today you received the following chemotherapy agents Carboplatin, Etopiside (VP-16).  You also received a flu shot.  To help prevent nausea and vomiting after your treatment, we encourage you to take your nausea medication as ordered by Dr. Arbutus Ped.   If you develop nausea and vomiting that is not controlled by your nausea medication, call the clinic.   BELOW ARE SYMPTOMS THAT SHOULD BE REPORTED IMMEDIATELY:  *FEVER GREATER THAN 100.5 F  *CHILLS WITH OR WITHOUT FEVER  NAUSEA AND VOMITING THAT IS NOT CONTROLLED WITH YOUR NAUSEA MEDICATION  *UNUSUAL SHORTNESS OF BREATH  *UNUSUAL BRUISING OR BLEEDING  TENDERNESS IN MOUTH AND THROAT WITH OR WITHOUT PRESENCE OF ULCERS  *URINARY PROBLEMS  *BOWEL PROBLEMS  UNUSUAL RASH Items with * indicate a potential emergency and should be followed up as soon as possible.  Feel free to call the clinic you have any questions or concerns. The clinic phone number is [phone].

## 2013-08-09 NOTE — Telephone Encounter (Signed)
Per staff message and POF I have scheduled appts.  JMW  

## 2013-08-09 NOTE — Progress Notes (Signed)
Patient given 2014 vaccine information statement before today's flu vaccine.

## 2013-08-09 NOTE — Progress Notes (Addendum)
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   PRIOR THERAPY:  1) Status post left upper lobectomy with lymph node dissection.  2) Adjuvant systemic chemotherapy with cisplatin 75 mg region squared and Alimta 500 mg per meter squared given every 3 weeks for total of 4 cycles, status post 4 cycles. Last dose was given 03/02/2013.   CURRENT THERAPY: Systemic chemotherapy with carboplatin for AUC of 5 on day 1 and etoposide at 120 mg/M2 on days 1, 2 and 3 with Neulasta support on day 4 every 3 weeks. First dose of chemotherapy on 07/19/2013. Status post 1 cycle.  CHEMOTHERAPY INTENT: Palliative  CURRENT # OF CHEMOTHERAPY CYCLES: 2  CURRENT ANTIEMETICS: Zofran, dexamethasone and Compazine.  CURRENT SMOKING STATUS: Former smoker  ORAL CHEMOTHERAPY AND CONSENT: None  CURRENT BISPHOSPHONATES USE: None  PAIN MANAGEMENT: 0/10  NARCOTICS INDUCED CONSTIPATION: None  LIVING WILL AND CODE STATUS: ?    INTERVAL HISTORY: Sara Mcclure 67 y.o. female returns to the clinic today for followup visit accompanied by her husband. She presents to proceed with cycle #2 of systemic chemotherapy of carboplatin and etoposide with Neulasta support. She tolerated the first cycle relatively well with the exception of constipation. She is status post Port-A-Cath placement on 08/05/2013 due to poor peripheral venous access. She reports she'll nausea and vomiting after the Port-A-Cath insertion. She believes it may be related to the anesthesia used that she's  had similar experiences after other surgeries. Her appetite has improved. She denies any specific pain. She does have some congestion that seems to settle in her throat, she is feeling much better after taking some Mucinex and using her nebulizer treatments. She requests a flu vaccine today.  The patient is feeling fine today with no specific complaints except for shortness breath with exertion, which is improved lately. She denied having any significant chest pain, or hemoptysis.  She has no weight loss or night sweats. The patient denied having any nausea or vomiting. She has no fever or chills. She tolerated the first cycle relatively well with the exception of a mild sore throat and tongue lesion both of which have resolved. She requests a flu vaccine today.  MEDICAL HISTORY: Past Medical History  Diagnosis Date  . GERD (gastroesophageal reflux disease)   . Asthma   . Hypertension   . Diabetes mellitus   . Thyroid disease   . Anxiety   . Vertigo   . Breast cancer   . COPD (chronic obstructive pulmonary disease)   . PONV (postoperative nausea and vomiting)     ALLERGIES:  is allergic to metformin and related.  MEDICATIONS:  Current Outpatient Prescriptions  Medication Sig Dispense Refill  . dextromethorphan-guaiFENesin (MUCINEX DM) 30-600 MG per 12 hr tablet Take 1 tablet by mouth every 12 (twelve) hours.      Marland Kitchen albuterol (ACCUNEB) 1.25 MG/3ML nebulizer solution Take 1 ampule by nebulization 2 (two) times daily as needed. For shortness of breath      . albuterol (PROVENTIL HFA;VENTOLIN HFA) 108 (90 BASE) MCG/ACT inhaler Inhale 2 puffs into the lungs every 6 (six) hours as  needed. For shortness of breath      . ALPRAZolam (XANAX) 0.25 MG tablet Take 0.25 mg by mouth 3 (three) times daily as needed for anxiety.       . Alum Hydroxide-Mag Carbonate (GAVISCON PO) Take 1 tablet by mouth daily as needed (stomach).      Marland Kitchen aspirin EC 81 MG tablet Take 81 mg by mouth every morning.       .  cetirizine (ZYRTEC) 10 MG tablet Take 10 mg by mouth daily as needed for allergies.      . Cholecalciferol (VITAMIN D) 2000 UNITS tablet Take 2,000 Units by mouth daily with lunch.       . Fluticasone-Salmeterol (ADVAIR) 250-50 MCG/DOSE AEPB Inhale 1 puff into the lungs every 12 (twelve) hours.      . fosinopril (MONOPRIL) 20 MG tablet Take 20 mg by mouth daily after breakfast.       . ibuprofen (ADVIL,MOTRIN) 200 MG tablet Take 200 mg by mouth every 8 (eight) hours as needed for pain.      Marland Kitchen levothyroxine (SYNTHROID, LEVOTHROID) 50 MCG tablet Take 50 mcg by mouth daily before breakfast.       . lidocaine-prilocaine (EMLA) cream Apply topically as needed. Apply to port 1 hour before chemotherapy.  30 g  0  . metoprolol tartrate (LOPRESSOR) 25 MG tablet Take 1 tablet (25 mg total) by mouth 2 (two) times daily.  60 tablet  1  . montelukast (SINGULAIR) 10 MG tablet Take 10 mg by mouth at bedtime.      . ondansetron (ZOFRAN-ODT) 8 MG disintegrating tablet Take 8 mg by mouth every 8 (eight) hours as needed for nausea.       . Polyvinyl Alcohol-Povidone (REFRESH OP) Place 2 drops into both eyes daily as needed (For watery eyes.).      Marland Kitchen PRESCRIPTION MEDICATION She receives her treatments at [facility] with Dr. Arbutus Ped. She received a 3 day cycle from 07/19/13 to 07/21/13 of Etoposide 200mg  and then an injection of Neulasta 6mg  on 07/22/13.      Marland Kitchen prochlorperazine (COMPAZINE) 10 MG tablet Take 10 mg by mouth every 6 (six) hours as needed (For nausea.).       No current facility-administered medications for this visit.   Facility-Administered Medications Ordered in Other Visits  Medication Dose Route Frequency Provider Last Rate Last Dose  . sodium chloride 0.9 % injection 10 mL  10 mL Intracatheter PRN Si Gaul, MD   10 mL at 08/09/13 1711    SURGICAL HISTORY:  Past Surgical History  Procedure Laterality Date  . Cardiac catherization    . Breast lumpectomy    . Tubal  ligation    . Video assisted thoracoscopy (vats)/wedge resection Left 11/19/2012    Procedure: VIDEO ASSISTED THORACOSCOPY (VATS)/WEDGE RESECTION;  Surgeon: Loreli Slot, MD;  Location: [facility] OR;  Service: Thoracic;  Laterality: Left;  left upper lobe wedge resection  . Lobectomy Left 11/19/2012    Procedure: LOBECTOMY;  Surgeon: Loreli Slot, MD;  Location: [facility] OR;  Service: Thoracic;  Laterality: Left;  left upper lobe  . Lymph node dissection Left 11/19/2012    Procedure: LYMPH NODE DISSECTION;  Surgeon: Loreli Slot, MD;  Location: [facility] OR;  Service: Thoracic;  Laterality: Left;    REVIEW OF SYSTEMS:  Constitutional: negative Eyes: negative Ears, nose, mouth, throat, and face: negative Respiratory: positive for dyspnea on exertion Cardiovascular: negative Gastrointestinal: negative Genitourinary:negative Integument/breast: negative Hematologic/lymphatic: negative Musculoskeletal:negative Neurological: negative  Behavioral/Psych: negative Endocrine: negative Allergic/Immunologic: negative   PHYSICAL EXAMINATION: General appearance: alert, cooperative and no distress Head: Normocephalic, without obvious abnormality, atraumatic Neck: no adenopathy, no JVD, supple, symmetrical, trachea midline and thyroid not enlarged, symmetric, no tenderness/mass/nodules Lymph nodes: Cervical, supraclavicular, and axillary nodes normal. Resp: clear to auscultation bilaterally Back: symmetric, no curvature. ROM normal. No CVA tenderness. Cardio: regular rate and rhythm, S1, S2 normal, no murmur, click, rub or gallop GI: soft, non-tender; bowel sounds normal; no masses,  no organomegaly Extremities: extremities normal, atraumatic, no cyanosis or edema Neurologic: Alert and oriented X 3, normal strength and tone. Normal symmetric reflexes. Normal coordination and gait Skin: Right anterior chest Port-A-Cath, Steri-Strips in place on the incisions without any evidence of dehiscence  ECOG  PERFORMANCE STATUS: 1 - Symptomatic but completely ambulatory  Blood pressure 134/49, pulse 71, temperature 99.8 F (37.7 C), temperature source Oral, resp. rate 18, height 5\' 7"  (1.702 m), weight 126 lb 8 oz (57.38 kg).  LABORATORY DATA: Lab Results  Component Value Date   WBC 13.3* 08/09/2013   HGB 9.8* 08/09/2013   HCT 28.8* 08/09/2013   MCV 87.5 08/09/2013   PLT 217 08/09/2013      Chemistry      Component Value Date/Time   NA 138 08/09/2013 1148   NA 135 08/05/2013 1132   K 4.0 08/09/2013 1148   K 4.0 08/05/2013 1132   CL 101 08/05/2013 1132   CL 102 03/30/2013 0951   CO2 23 08/09/2013 1148   CO2 23 08/05/2013 1132   BUN 10.3 08/09/2013 1148   BUN 13 08/05/2013 1132   CREATININE 1.0 08/09/2013 1148   CREATININE 0.99 08/05/2013 1132      Component Value Date/Time   CALCIUM 9.8 08/09/2013 1148   CALCIUM 10.8* 08/05/2013 1132   ALKPHOS 84 08/09/2013 1148   ALKPHOS 65 11/21/2012 0445   AST 18 08/09/2013 1148   AST 15 11/21/2012 0445   ALT 10 08/09/2013 1148   ALT 16 11/21/2012 0445   BILITOT 0.29 08/09/2013 1148   BILITOT 0.4 11/21/2012 0445       RADIOGRAPHIC STUDIES: Ct Chest W Contrast  06/29/2013   CLINICAL DATA:  Lung cancer status post partial lung resection. History of breast cancer with lumpectomy in 1995. Chemotherapy completed 4 months ago.  EXAM: CT CHEST WITH CONTRAST  TECHNIQUE: Multidetector CT imaging of the chest was performed during intravenous contrast administration.  CONTRAST:  80mL OMNIPAQUE IOHEXOL 300 MG/ML  SOLN  COMPARISON:  Chest CT 03/26/2013 and 11/03/2012. PET-CT 11/06/2012.  FINDINGS: There are stable postsurgical findings status post left upper lobe resection. Best seen on the reformatted images is increased soft tissue in the AP window worrisome for local recurrence or AP window adenopathy. This measures 1.8 cm short axis on coronal image 38. No other enlarged mediastinal or hilar lymph nodes are demonstrated.  There is a stable small left  pleural effusion without associated pleural nodularity. There is no right pleural effusion or pericardial effusion. There is stable atherosclerosis of the aorta, great vessels and coronary arteries. There are stable postsurgical changes within the left breast and left axilla.  There are stable emphysematous changes throughout the lungs with mild biapical scarring. No discrete nodules are demonstrated currently.  Images through the upper abdomen demonstrate the interval development of multiple peripherally enhancing liver lesions consistent with metastatic disease. Representative lesions measure 1.8 cm in the right lobe on image 57 and 1.7 cm in the left lobe on image 61. A left adrenal  nodule is stable.  There are no worrisome osseous findings. There is a stable central calcified disc protrusion at T6-7.  IMPRESSION: 1. 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  2. Interval development of increased soft tissue in the AP window worrisome for nodal metastasis or local recurrence of lung cancer.  3. No evidence of pulmonary metastasis. Stable chronic lung disease.  4. Stable left adrenal adenoma.   Electronically Signed   By: Roxy Horseman   On: 06/29/2013 13:41   Mr Laqueta Jean Wo Contrast  07/10/2013   CLINICAL DATA:  67 year old female with lung cancer. Progressive disease on recent chest CT. Restaging.  EXAM: MRI HEAD WITHOUT AND WITH CONTRAST  TECHNIQUE: Multiplanar, multiecho pulse sequences of the brain and surrounding structures were obtained according to standard protocol without and with intravenous contrast  CONTRAST:  12mL MULTIHANCE GADOBENATE DIMEGLUMINE 529 MG/ML IV SOLN  COMPARISON:  Brain MRI 01/09/2013.  FINDINGS: Stable cerebral volume. Stable post-contrast appearance of the brain, with no definite abnormal intracranial enhancement (ACA artery ectasia suspected on series 13, image 49 and unchanged).  No midline shift, mass effect, or evidence of intracranial mass lesion.  No  restricted diffusion or evidence of acute infarction. No ventriculomegaly. No acute intracranial hemorrhage identified. Negative pituitary and cervicomedullary junction. Grossly negative visualized cervical spine. Bone marrow signal is stable and within normal limits.  Stable minimal to mild for age nonspecific occasional cerebral white matter T2 and FLAIR hyperintense foci. No new signal abnormality. Major intracranial vascular flow voids are stable.  Visualized orbit soft tissues are within normal limits. Improved paranasal sinus aeration. Mastoids are clear. Negative scalp soft tissues.  IMPRESSION: Stable and negative MRI appearance of the brain. No acute or metastatic intracranial abnormality.   Electronically Signed   By: Augusto Gamble M.D.   On: 07/10/2013 15:02   Nm Pet Image Restag (ps) Skull Base To Thigh  07/12/2013   ADDENDUM REPORT: 07/12/2013 10:18  ADDENDUM: Omitted from the body of the report: Stable 13 mm left adrenal adenoma (series 2/image 131), non-FDG-avid.  The remainder of the report, including the impression, is unchanged.   Electronically Signed   By: Charline Bills M.D.   On: 07/12/2013 10:18   07/12/2013   CLINICAL DATA:  Subsequent treatment strategy for left upper lobe lung cancer status post left lobectomy. New liver metastasis. Last chemotherapy 02/2013. History of left breast cancer status post lumpectomy.  EXAM: NUCLEAR MEDICINE PET SKULL BASE TO THIGH  FASTING BLOOD GLUCOSE:  Value:  98 mg/dl  TECHNIQUE: 16.1 mCi W-96 FDG was injected intravenously. CT data was obtained and used for attenuation correction and anatomic localization only. (This was not acquired as a diagnostic CT examination.) Additional exam technical data entered on technologist worksheet.  COMPARISON:  CT chest dated 06/29/2013. PET-CT dated 11/06/2012.  FINDINGS: NECK  No hypermetabolic lymph nodes in the neck.  CHEST  Status post left upper lobectomy with associated volume loss.  No new/suspicious pulmonary  nodules. Moderate paraseptal emphysematous changes. Small left pleural effusion. No pneumothorax.  Nodal metastases in the left AP window (series 2/images 70 and 76), better visualized on prior CT chest, max SUV 9.7. 11 x 15 mm left prevascular node (series 2/ image 84), max SUV 6.7. Two extra-pleural lymph nodes at the left costovertebral junction measuring 12 mm (series 2/image 93) and 9 mm (series 2/image 118), max SUV 7.3.  Postsurgical changes in the lower left breast with surgical clips in the left axilla.  ABDOMEN/PELVIS  At least  five hepatic metastases. Two representative lesions in the anterior segment right hepatic lobe measure 19 x 24 mm (series 2/image 129) and 25 x 22 mm (series 2/image 135), max SUV 9.7. Additional lesions are present in the lateral segment left hepatic lobe (series 2/image 118) and medial segment left hepatic lobe (series 2/image 133). Possible lesion in the right hepatic dome (series 2/image 116), although without definite hypermetabolism on the current study.  No abnormal hypermetabolic activity within the pancreas, adrenal glands, or spleen.  No hypermetabolic lymph nodes in the abdomen or pelvis.  Focal hypermetabolism in the region of the rectum (PET image 226), nonspecific.  SKELETON  10 mm lytic metastasis in the left iliac bone (series 2/image 195), max SUV 5.6.  IMPRESSION: Status post left upper lobectomy.  Prevascular, AP window, and extra-pleural nodal metastases, max SUV 9.7.  Small left pleural effusion.  At least 5 hepatic metastases, as described above, max SUV 9.7.  Lytic metastasis in the left iliac bone, max SUV 5.6.  Electronically Signed: By: Charline Bills M.D. On: 07/12/2013 10:10   [place] Core Biopsy  07/09/2013   *RADIOLOGY REPORT*  Indication: History of lung and breast cancer, now with development of multiple hepatic lesions worrisome for metastatic disease  ULTRASOUND GUIDED LIVER LESION BIOPSY  Comparison: Chest CT - 06/29/2013; 03/26/2013  Intravenous  medications: Fentanyl 100 mcg IV; Versed 2 mg IV  Total Moderate Sedation time: 20 minutes  Complications: None immediate  Procedure:  Informed written consent was obtained from the patient after a discussion of the risks, benefits and alternatives to treatment. The patient understands and consents the procedure.  A timeout was performed prior to the initiation of the procedure.  Ultrasound scanning was performed of the right upper abdominal quadrant demonstrates several mixed echogenic nodules within the right lobe of the liver compatible with the findings on recently performed chest CT.  The dominant approximately 2.4 x 1.6 cm lesion adjacent to the gallbladder fossa was targeted for biopsy.  The right upper abdominal quadrant was prepped and draped in the usual sterile fashion. After the patient with sedated, initial trajectory was aborted secondary to interposition of the gallbladder.  As such, a more cranial, intercostal approach was necessary.  The overlying soft tissues were anesthetized with 1% lidocaine with epinephrine.  A 17 gauge, 6.8 cm co-axial needle was advanced into a peripheral aspect of the lesion and  3 core biopsies with an 18 gauge core device under direct ultrasound guidance.  The co-axial needle was removed and hemostasis was obtained with manual compression.  Post procedural scanning was negative for definitive area of hemorrhage or additional complication.  A dressing was placed.  The patient tolerated the procedure well without immediate post procedural complication.  Impression:  Technically successful ultrasound guided core needle biopsy of dominant mixed echogenic solid nodule adjacent to the gallbladder fossa.   Original Report Authenticated By: Tacey Ruiz, MD    ASSESSMENT AND PLAN: This is a very pleasant 67 years old white female recently diagnosed with neuroendocrine carcinoma, small cell with multiple liver metastases as well as left AP window lymphadenopathy, extrapleural  nodal metastasis and left iliac bone lesion. The patient has no evidence for metastatic disease to the brain.  She has a history of neuroendocrine carcinoma, large cell type diagnosed as stage II in February of 2014 status post left upper lobectomy followed by 4 cycles of adjuvant chemotherapy with cisplatin and etoposide. She's currently being treated with systemic chemotherapy in the form of carboplatin  for an AUC of 5 given on day 1 and etoposide at 120 mg router squared on days 1, 2 and 3 with Neulasta support given on day 4 every 3 weeks, status post 1 cycle. TPatient was discussed with an also seen by Dr. Arbutus Ped. She'll proceed with cycle #2 today as scheduled. She will continue with weekly labs consisting of a CBC differential and C. met. She'll followup with Dr. Arbutus Ped in 3 weeks with a restaging CT scan of the chest, abdomen and pelvis with to reevaluate her disease. She will receive her flu vaccine today.   Sara Mcclure, Sara Apple Mcclure, Sara Mcclure   She was advised to call immediately if she has any concerning symptoms in the interval.  The patient voices understanding of current disease status and treatment options and is in agreement with the current care plan.  All questions were answered. The patient knows to call the clinic with any problems, questions or concerns. We can certainly see the patient much sooner if necessary.  ADDENDUM: Hematology/Oncology Attending:  I had a face to face encounter with the patient. I recommended her care plan. This is a very pleasant 67 years old white female with metastatic neuroendocrine carcinoma currently undergoing systemic chemotherapy with carboplatin and etoposide status post 1 cycle. The patient tolerated the first cycle of her treatment fairly well. She had only some nausea and vomiting after the Port-A-Cath placement most likely secondary to anesthesia. We will proceed with cycle #2 today as scheduled. The patient would come back for follow up visit in 3 weeks  with repeat CT scan of the chest, abdomen and pelvis for restaging of her disease. She was advised to call immediately if she has any concerning symptoms in the interval. Lajuana Matte., MD 08/15/2013

## 2013-08-09 NOTE — Patient Instructions (Signed)
Continue weekly labs as scheduled Follow with Dr. Arbutus Ped in 3 weeks with a restaging CT scan of the chest, abdomen and pelvis to reevaluate your disease

## 2013-08-10 ENCOUNTER — Ambulatory Visit (HOSPITAL_BASED_OUTPATIENT_CLINIC_OR_DEPARTMENT_OTHER): Payer: Medicare Other

## 2013-08-10 VITALS — BP 133/77 | HR 79 | Temp 98.5°F

## 2013-08-10 DIAGNOSIS — C7A1 Malignant poorly differentiated neuroendocrine tumors: Secondary | ICD-10-CM

## 2013-08-10 DIAGNOSIS — Z5111 Encounter for antineoplastic chemotherapy: Secondary | ICD-10-CM

## 2013-08-10 DIAGNOSIS — C3492 Malignant neoplasm of unspecified part of left bronchus or lung: Secondary | ICD-10-CM

## 2013-08-10 MED ORDER — DEXAMETHASONE SODIUM PHOSPHATE 10 MG/ML IJ SOLN
INTRAMUSCULAR | Status: AC
  Filled 2013-08-10: qty 1

## 2013-08-10 MED ORDER — ETOPOSIDE CHEMO INJECTION 1 GM/50ML
120.0000 mg/m2 | Freq: Once | INTRAVENOUS | Status: AC
  Administered 2013-08-10: 200 mg via INTRAVENOUS
  Filled 2013-08-10: qty 10

## 2013-08-10 MED ORDER — ONDANSETRON 8 MG/NS 50 ML IVPB
INTRAVENOUS | Status: AC
  Filled 2013-08-10: qty 8

## 2013-08-10 MED ORDER — SODIUM CHLORIDE 0.9 % IJ SOLN
10.0000 mL | INTRAMUSCULAR | Status: DC | PRN
  Administered 2013-08-10: 10 mL
  Filled 2013-08-10: qty 10

## 2013-08-10 MED ORDER — ONDANSETRON 8 MG/50ML IVPB (CHCC)
8.0000 mg | Freq: Once | INTRAVENOUS | Status: AC
  Administered 2013-08-10: 8 mg via INTRAVENOUS

## 2013-08-10 MED ORDER — SODIUM CHLORIDE 0.9 % IV SOLN
Freq: Once | INTRAVENOUS | Status: AC
  Administered 2013-08-10: 10:00:00 via INTRAVENOUS

## 2013-08-10 MED ORDER — HEPARIN SOD (PORK) LOCK FLUSH 100 UNIT/ML IV SOLN
500.0000 [IU] | Freq: Once | INTRAVENOUS | Status: AC | PRN
  Administered 2013-08-10: 500 [IU]
  Filled 2013-08-10: qty 5

## 2013-08-10 MED ORDER — DEXAMETHASONE SODIUM PHOSPHATE 10 MG/ML IJ SOLN
10.0000 mg | Freq: Once | INTRAMUSCULAR | Status: AC
  Administered 2013-08-10: 10 mg via INTRAVENOUS

## 2013-08-10 NOTE — Patient Instructions (Signed)
[facility] Discharge Instructions for Patients Receiving Chemotherapy  Today you received the following chemotherapy agents:  Etoposide  To help prevent nausea and vomiting after your treatment, we encourage you to take your nausea medication as ordered per MD.   If you develop nausea and vomiting that is not controlled by your nausea medication, call the clinic.   BELOW ARE SYMPTOMS THAT SHOULD BE REPORTED IMMEDIATELY:  *FEVER GREATER THAN 100.5 F  *CHILLS WITH OR WITHOUT FEVER  NAUSEA AND VOMITING THAT IS NOT CONTROLLED WITH YOUR NAUSEA MEDICATION  *UNUSUAL SHORTNESS OF BREATH  *UNUSUAL BRUISING OR BLEEDING  TENDERNESS IN MOUTH AND THROAT WITH OR WITHOUT PRESENCE OF ULCERS  *URINARY PROBLEMS  *BOWEL PROBLEMS  UNUSUAL RASH Items with * indicate a potential emergency and should be followed up as soon as possible.  Feel free to call the clinic you have any questions or concerns. The clinic phone number is [phone].    

## 2013-08-10 NOTE — Progress Notes (Signed)
DISCHARGED AT 11:45 WITH SPOUSE, AMBULATORY IN NO DISTRESS.

## 2013-08-11 ENCOUNTER — Ambulatory Visit (HOSPITAL_BASED_OUTPATIENT_CLINIC_OR_DEPARTMENT_OTHER): Payer: Medicare Other

## 2013-08-11 ENCOUNTER — Encounter (INDEPENDENT_AMBULATORY_CARE_PROVIDER_SITE_OTHER): Payer: Self-pay

## 2013-08-11 VITALS — BP 124/61 | HR 78 | Temp 98.3°F | Resp 20

## 2013-08-11 DIAGNOSIS — C7A00 Malignant carcinoid tumor of unspecified site: Secondary | ICD-10-CM

## 2013-08-11 DIAGNOSIS — C7B8 Other secondary neuroendocrine tumors: Secondary | ICD-10-CM

## 2013-08-11 DIAGNOSIS — Z5111 Encounter for antineoplastic chemotherapy: Secondary | ICD-10-CM

## 2013-08-11 DIAGNOSIS — C787 Secondary malignant neoplasm of liver and intrahepatic bile duct: Secondary | ICD-10-CM

## 2013-08-11 DIAGNOSIS — C3492 Malignant neoplasm of unspecified part of left bronchus or lung: Secondary | ICD-10-CM

## 2013-08-11 MED ORDER — SODIUM CHLORIDE 0.9 % IJ SOLN
10.0000 mL | INTRAMUSCULAR | Status: DC | PRN
  Administered 2013-08-11: 10 mL
  Filled 2013-08-11: qty 10

## 2013-08-11 MED ORDER — SODIUM CHLORIDE 0.9 % IV SOLN
Freq: Once | INTRAVENOUS | Status: AC
  Administered 2013-08-11: 10:00:00 via INTRAVENOUS

## 2013-08-11 MED ORDER — ETOPOSIDE CHEMO INJECTION 1 GM/50ML
120.0000 mg/m2 | Freq: Once | INTRAVENOUS | Status: AC
  Administered 2013-08-11: 200 mg via INTRAVENOUS
  Filled 2013-08-11: qty 10

## 2013-08-11 MED ORDER — DEXAMETHASONE SODIUM PHOSPHATE 10 MG/ML IJ SOLN
10.0000 mg | Freq: Once | INTRAMUSCULAR | Status: AC
  Administered 2013-08-11: 10 mg via INTRAVENOUS

## 2013-08-11 MED ORDER — HEPARIN SOD (PORK) LOCK FLUSH 100 UNIT/ML IV SOLN
500.0000 [IU] | Freq: Once | INTRAVENOUS | Status: AC | PRN
  Administered 2013-08-11: 500 [IU]
  Filled 2013-08-11: qty 5

## 2013-08-11 MED ORDER — DEXAMETHASONE SODIUM PHOSPHATE 10 MG/ML IJ SOLN
INTRAMUSCULAR | Status: AC
  Filled 2013-08-11: qty 1

## 2013-08-11 MED ORDER — ONDANSETRON 8 MG/50ML IVPB (CHCC)
8.0000 mg | Freq: Once | INTRAVENOUS | Status: AC
  Administered 2013-08-11: 8 mg via INTRAVENOUS

## 2013-08-11 MED ORDER — ONDANSETRON 8 MG/NS 50 ML IVPB
INTRAVENOUS | Status: AC
  Filled 2013-08-11: qty 8

## 2013-08-11 NOTE — Patient Instructions (Signed)
[facility] Discharge Instructions for Patients Receiving Chemotherapy  Today you received the following chemotherapy agents: Etoposide.  To help prevent nausea and vomiting after your treatment, we encourage you to take your nausea medication as prescribed.   If you develop nausea and vomiting that is not controlled by your nausea medication, call the clinic.   BELOW ARE SYMPTOMS THAT SHOULD BE REPORTED IMMEDIATELY:  *FEVER GREATER THAN 100.5 F  *CHILLS WITH OR WITHOUT FEVER  NAUSEA AND VOMITING THAT IS NOT CONTROLLED WITH YOUR NAUSEA MEDICATION  *UNUSUAL SHORTNESS OF BREATH  *UNUSUAL BRUISING OR BLEEDING  TENDERNESS IN MOUTH AND THROAT WITH OR WITHOUT PRESENCE OF ULCERS  *URINARY PROBLEMS  *BOWEL PROBLEMS  UNUSUAL RASH Items with * indicate a potential emergency and should be followed up as soon as possible.  Feel free to call the clinic you have any questions or concerns. The clinic phone number is [phone].    

## 2013-08-12 ENCOUNTER — Ambulatory Visit (HOSPITAL_BASED_OUTPATIENT_CLINIC_OR_DEPARTMENT_OTHER): Payer: Medicare Other

## 2013-08-12 ENCOUNTER — Telehealth: Payer: Self-pay | Admitting: Internal Medicine

## 2013-08-12 ENCOUNTER — Encounter (INDEPENDENT_AMBULATORY_CARE_PROVIDER_SITE_OTHER): Payer: Self-pay

## 2013-08-12 VITALS — BP 132/62 | HR 64 | Temp 98.2°F

## 2013-08-12 DIAGNOSIS — C787 Secondary malignant neoplasm of liver and intrahepatic bile duct: Secondary | ICD-10-CM

## 2013-08-12 DIAGNOSIS — C3492 Malignant neoplasm of unspecified part of left bronchus or lung: Secondary | ICD-10-CM

## 2013-08-12 DIAGNOSIS — C7B8 Other secondary neuroendocrine tumors: Secondary | ICD-10-CM

## 2013-08-12 DIAGNOSIS — Z5189 Encounter for other specified aftercare: Secondary | ICD-10-CM

## 2013-08-12 DIAGNOSIS — C7A00 Malignant carcinoid tumor of unspecified site: Secondary | ICD-10-CM

## 2013-08-12 MED ORDER — PEGFILGRASTIM INJECTION 6 MG/0.6ML
6.0000 mg | Freq: Once | SUBCUTANEOUS | Status: AC
  Administered 2013-08-12: 6 mg via SUBCUTANEOUS
  Filled 2013-08-12: qty 0.6

## 2013-08-12 NOTE — Telephone Encounter (Signed)
Talked to pt and gave her appt for November and December 2014 lab ,MD and chemo °

## 2013-08-16 ENCOUNTER — Other Ambulatory Visit (HOSPITAL_BASED_OUTPATIENT_CLINIC_OR_DEPARTMENT_OTHER): Payer: Medicare Other | Admitting: Lab

## 2013-08-16 DIAGNOSIS — C787 Secondary malignant neoplasm of liver and intrahepatic bile duct: Secondary | ICD-10-CM

## 2013-08-16 DIAGNOSIS — C3492 Malignant neoplasm of unspecified part of left bronchus or lung: Secondary | ICD-10-CM

## 2013-08-16 DIAGNOSIS — C7A1 Malignant poorly differentiated neuroendocrine tumors: Secondary | ICD-10-CM

## 2013-08-16 LAB — CBC WITH DIFFERENTIAL/PLATELET
BASO%: 0.8 % (ref 0.0–2.0)
Basophils Absolute: 0 10*3/uL (ref 0.0–0.1)
Eosinophils Absolute: 0 10*3/uL (ref 0.0–0.5)
HCT: 27 % — ABNORMAL LOW (ref 34.8–46.6)
HGB: 9.3 g/dL — ABNORMAL LOW (ref 11.6–15.9)
LYMPH%: 22.5 % (ref 14.0–49.7)
MCHC: 34.3 g/dL (ref 31.5–36.0)
MCV: 90.5 fL (ref 79.5–101.0)
MONO#: 0.4 10*3/uL (ref 0.1–0.9)
MONO%: 6.5 % (ref 0.0–14.0)
NEUT#: 4.2 10*3/uL (ref 1.5–6.5)
RBC: 2.98 10*6/uL — ABNORMAL LOW (ref 3.70–5.45)
RDW: 14 % (ref 11.2–14.5)
WBC: 6 10*3/uL (ref 3.9–10.3)
lymph#: 1.4 10*3/uL (ref 0.9–3.3)

## 2013-08-16 LAB — COMPREHENSIVE METABOLIC PANEL (CC13)
Albumin: 3.3 g/dL — ABNORMAL LOW (ref 3.5–5.0)
Anion Gap: 9 mEq/L (ref 3–11)
BUN: 21.3 mg/dL (ref 7.0–26.0)
CO2: 22 mEq/L (ref 22–29)
Calcium: 9.7 mg/dL (ref 8.4–10.4)
Creatinine: 0.8 mg/dL (ref 0.6–1.1)
Glucose: 87 mg/dl (ref 70–140)
Total Bilirubin: 0.62 mg/dL (ref 0.20–1.20)
Total Protein: 6.6 g/dL (ref 6.4–8.3)

## 2013-08-23 ENCOUNTER — Encounter (INDEPENDENT_AMBULATORY_CARE_PROVIDER_SITE_OTHER): Payer: Self-pay

## 2013-08-23 ENCOUNTER — Other Ambulatory Visit (HOSPITAL_BASED_OUTPATIENT_CLINIC_OR_DEPARTMENT_OTHER): Payer: Medicare Other | Admitting: Lab

## 2013-08-23 DIAGNOSIS — C3492 Malignant neoplasm of unspecified part of left bronchus or lung: Secondary | ICD-10-CM

## 2013-08-23 DIAGNOSIS — C7A00 Malignant carcinoid tumor of unspecified site: Secondary | ICD-10-CM

## 2013-08-23 DIAGNOSIS — C787 Secondary malignant neoplasm of liver and intrahepatic bile duct: Secondary | ICD-10-CM

## 2013-08-23 LAB — CBC WITH DIFFERENTIAL/PLATELET
BASO%: 0.7 % (ref 0.0–2.0)
Basophils Absolute: 0.1 10*3/uL (ref 0.0–0.1)
HCT: 25.7 % — ABNORMAL LOW (ref 34.8–46.6)
HGB: 8.7 g/dL — ABNORMAL LOW (ref 11.6–15.9)
MCHC: 33.9 g/dL (ref 31.5–36.0)
MONO#: 1.2 10*3/uL — ABNORMAL HIGH (ref 0.1–0.9)
NEUT%: 71.4 % (ref 38.4–76.8)
RDW: 13.8 % (ref 11.2–14.5)
WBC: 13.8 10*3/uL — ABNORMAL HIGH (ref 3.9–10.3)
lymph#: 2.5 10*3/uL (ref 0.9–3.3)

## 2013-08-23 LAB — COMPREHENSIVE METABOLIC PANEL (CC13)
ALT: 10 U/L (ref 0–55)
AST: 14 U/L (ref 5–34)
Albumin: 3.2 g/dL — ABNORMAL LOW (ref 3.5–5.0)
Anion Gap: 9 mEq/L (ref 3–11)
BUN: 9 mg/dL (ref 7.0–26.0)
CO2: 22 mEq/L (ref 22–29)
Calcium: 9.3 mg/dL (ref 8.4–10.4)
Chloride: 105 mEq/L (ref 98–109)
Creatinine: 0.9 mg/dL (ref 0.6–1.1)
Glucose: 111 mg/dl (ref 70–140)
Potassium: 3.8 mEq/L (ref 3.5–5.1)

## 2013-08-27 ENCOUNTER — Ambulatory Visit (HOSPITAL_COMMUNITY)
Admission: RE | Admit: 2013-08-27 | Discharge: 2013-08-27 | Disposition: A | Discharge status: Home / Self Care | Payer: Medicare Other | Source: Ambulatory Visit | Attending: Physician Assistant | Admitting: Physician Assistant

## 2013-08-27 DIAGNOSIS — M899 Disorder of bone, unspecified: Secondary | ICD-10-CM | POA: Insufficient documentation

## 2013-08-27 DIAGNOSIS — C787 Secondary malignant neoplasm of liver and intrahepatic bile duct: Secondary | ICD-10-CM | POA: Insufficient documentation

## 2013-08-27 DIAGNOSIS — M47814 Spondylosis without myelopathy or radiculopathy, thoracic region: Secondary | ICD-10-CM | POA: Insufficient documentation

## 2013-08-27 DIAGNOSIS — C3490 Malignant neoplasm of unspecified part of unspecified bronchus or lung: Secondary | ICD-10-CM

## 2013-08-27 DIAGNOSIS — I708 Atherosclerosis of other arteries: Secondary | ICD-10-CM | POA: Insufficient documentation

## 2013-08-27 DIAGNOSIS — J438 Other emphysema: Secondary | ICD-10-CM | POA: Insufficient documentation

## 2013-08-27 DIAGNOSIS — I700 Atherosclerosis of aorta: Secondary | ICD-10-CM | POA: Insufficient documentation

## 2013-08-27 DIAGNOSIS — E279 Disorder of adrenal gland, unspecified: Secondary | ICD-10-CM | POA: Insufficient documentation

## 2013-08-27 DIAGNOSIS — C7A1 Malignant poorly differentiated neuroendocrine tumors: Secondary | ICD-10-CM | POA: Insufficient documentation

## 2013-08-27 MED ORDER — IOHEXOL 300 MG/ML  SOLN
100.0000 mL | Freq: Once | INTRAMUSCULAR | Status: AC | PRN
  Administered 2013-08-27: 100 mL via INTRAVENOUS

## 2013-08-30 ENCOUNTER — Ambulatory Visit (HOSPITAL_BASED_OUTPATIENT_CLINIC_OR_DEPARTMENT_OTHER): Payer: Medicare Other

## 2013-08-30 ENCOUNTER — Encounter: Payer: Self-pay | Admitting: Internal Medicine

## 2013-08-30 ENCOUNTER — Other Ambulatory Visit (HOSPITAL_BASED_OUTPATIENT_CLINIC_OR_DEPARTMENT_OTHER): Payer: Medicare Other | Admitting: Lab

## 2013-08-30 ENCOUNTER — Encounter (INDEPENDENT_AMBULATORY_CARE_PROVIDER_SITE_OTHER): Payer: Self-pay

## 2013-08-30 ENCOUNTER — Encounter: Payer: Self-pay | Admitting: *Deleted

## 2013-08-30 ENCOUNTER — Ambulatory Visit (HOSPITAL_BASED_OUTPATIENT_CLINIC_OR_DEPARTMENT_OTHER): Payer: Medicare Other | Admitting: Internal Medicine

## 2013-08-30 ENCOUNTER — Telehealth: Payer: Self-pay | Admitting: Internal Medicine

## 2013-08-30 DIAGNOSIS — C7B8 Other secondary neuroendocrine tumors: Secondary | ICD-10-CM

## 2013-08-30 DIAGNOSIS — C3492 Malignant neoplasm of unspecified part of left bronchus or lung: Secondary | ICD-10-CM

## 2013-08-30 DIAGNOSIS — C7A092 Malignant carcinoid tumor of the stomach: Secondary | ICD-10-CM

## 2013-08-30 DIAGNOSIS — C787 Secondary malignant neoplasm of liver and intrahepatic bile duct: Secondary | ICD-10-CM

## 2013-08-30 DIAGNOSIS — C7A1 Malignant poorly differentiated neuroendocrine tumors: Secondary | ICD-10-CM

## 2013-08-30 DIAGNOSIS — Z5111 Encounter for antineoplastic chemotherapy: Secondary | ICD-10-CM

## 2013-08-30 LAB — COMPREHENSIVE METABOLIC PANEL (CC13)
ALT: 7 U/L (ref 0–55)
AST: 15 U/L (ref 5–34)
Anion Gap: 8 mEq/L (ref 3–11)
CO2: 21 mEq/L — ABNORMAL LOW (ref 22–29)
Calcium: 9.5 mg/dL (ref 8.4–10.4)
Chloride: 106 mEq/L (ref 98–109)
Sodium: 135 mEq/L — ABNORMAL LOW (ref 136–145)
Total Bilirubin: 0.24 mg/dL (ref 0.20–1.20)
Total Protein: 6.5 g/dL (ref 6.4–8.3)

## 2013-08-30 LAB — CBC WITH DIFFERENTIAL/PLATELET
BASO%: 0.4 % (ref 0.0–2.0)
Basophils Absolute: 0.1 10*3/uL (ref 0.0–0.1)
EOS%: 0.8 % (ref 0.0–7.0)
HGB: 8.1 g/dL — ABNORMAL LOW (ref 11.6–15.9)
MCH: 29.7 pg (ref 25.1–34.0)
MCV: 90.8 fL (ref 79.5–101.0)
MONO#: 1 10*3/uL — ABNORMAL HIGH (ref 0.1–0.9)
MONO%: 8.4 % (ref 0.0–14.0)
RBC: 2.73 10*6/uL — ABNORMAL LOW (ref 3.70–5.45)
RDW: 16.1 % — ABNORMAL HIGH (ref 11.2–14.5)
WBC: 12.3 10*3/uL — ABNORMAL HIGH (ref 3.9–10.3)
lymph#: 1.9 10*3/uL (ref 0.9–3.3)
nRBC: 0 % (ref 0–0)

## 2013-08-30 MED ORDER — ONDANSETRON 16 MG/50ML IVPB (CHCC)
INTRAVENOUS | Status: AC
  Filled 2013-08-30: qty 16

## 2013-08-30 MED ORDER — DEXAMETHASONE SODIUM PHOSPHATE 20 MG/5ML IJ SOLN
INTRAMUSCULAR | Status: AC
  Filled 2013-08-30: qty 5

## 2013-08-30 MED ORDER — SODIUM CHLORIDE 0.9 % IV SOLN
Freq: Once | INTRAVENOUS | Status: AC
  Administered 2013-08-30: 13:00:00 via INTRAVENOUS

## 2013-08-30 MED ORDER — SODIUM CHLORIDE 0.9 % IV SOLN
120.0000 mg/m2 | Freq: Once | INTRAVENOUS | Status: AC
  Administered 2013-08-30: 200 mg via INTRAVENOUS
  Filled 2013-08-30: qty 10

## 2013-08-30 MED ORDER — ONDANSETRON 16 MG/50ML IVPB (CHCC)
16.0000 mg | Freq: Once | INTRAVENOUS | Status: AC
  Administered 2013-08-30: 16 mg via INTRAVENOUS

## 2013-08-30 MED ORDER — SODIUM CHLORIDE 0.9 % IV SOLN
372.0000 mg | Freq: Once | INTRAVENOUS | Status: AC
  Administered 2013-08-30: 370 mg via INTRAVENOUS
  Filled 2013-08-30: qty 37

## 2013-08-30 MED ORDER — HEPARIN SOD (PORK) LOCK FLUSH 100 UNIT/ML IV SOLN
500.0000 [IU] | Freq: Once | INTRAVENOUS | Status: AC | PRN
  Administered 2013-08-30: 500 [IU]
  Filled 2013-08-30: qty 5

## 2013-08-30 MED ORDER — SODIUM CHLORIDE 0.9 % IJ SOLN
10.0000 mL | INTRAMUSCULAR | Status: DC | PRN
  Administered 2013-08-30: 10 mL
  Filled 2013-08-30: qty 10

## 2013-08-30 MED ORDER — DEXAMETHASONE SODIUM PHOSPHATE 20 MG/5ML IJ SOLN
20.0000 mg | Freq: Once | INTRAMUSCULAR | Status: AC
  Administered 2013-08-30: 20 mg via INTRAVENOUS

## 2013-08-30 NOTE — Progress Notes (Signed)
OK to treat patient today with chemotherapy with hgb 8.1 per Dr. Arbutus Ped.

## 2013-08-30 NOTE — Progress Notes (Signed)
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   PRIOR THERAPY:  1) Status post left upper lobectomy with lymph node dissection.  2) Adjuvant systemic chemotherapy with cisplatin 75 mg region squared and Alimta 500 mg per meter squared given every 3 weeks for total of 4 cycles, status post 4 cycles. Last dose was given 03/02/2013.   CURRENT THERAPY: Systemic chemotherapy with carboplatin for AUC of 5 on day 1 and etoposide at 120 mg/M2 on days 1, 2 and 3 with Neulasta support on day 4 every 3 weeks. First dose of chemotherapy on 07/19/2013. Status post 2 cycles.  CHEMOTHERAPY INTENT: Palliative  CURRENT # OF CHEMOTHERAPY CYCLES: 3  CURRENT ANTIEMETICS: Zofran, dexamethasone and Compazine.  CURRENT SMOKING STATUS: Former smoker  ORAL CHEMOTHERAPY AND CONSENT: None  CURRENT BISPHOSPHONATES USE: None  PAIN MANAGEMENT: 0/10  NARCOTICS INDUCED CONSTIPATION: None  LIVING WILL AND CODE STATUS: ?    INTERVAL HISTORY: Sara Mcclure 67 y.o. female returns to the clinic today for followup visit accompanied by her husband. She presents to proceed with cycle #3 of systemic chemotherapy of carboplatin and etoposide with Neulasta support. She tolerated the first 2 cycles relatively well with the exception of constipation.  She denied having any significant chest pain, shortness of breath, cough or hemoptysis.  She has no weight loss or night sweats. The patient denied having any nausea or vomiting. She has no fever or chills. She had repeat CT  scan of the chest, abdomen and pelvis performed recently and she is here for evaluation and discussion of her scan results.  MEDICAL HISTORY: Past Medical History  Diagnosis Date  . GERD (gastroesophageal reflux disease)   . Asthma   . Hypertension   . Diabetes mellitus   . Thyroid disease   . Anxiety   . Vertigo   . Breast cancer   . COPD (chronic obstructive pulmonary disease)   . PONV (postoperative nausea and vomiting)     ALLERGIES:  is allergic to metformin and related.  MEDICATIONS:  Current Outpatient Prescriptions  Medication Sig Dispense Refill  . albuterol (ACCUNEB) 1.25 MG/3ML nebulizer solution Take 1 ampule by nebulization 2 (two) times daily as needed. For shortness of breath      . albuterol (PROVENTIL HFA;VENTOLIN HFA) 108 (90 BASE) MCG/ACT inhaler Inhale 2 puffs into the lungs every 6 (six) hours as needed. For shortness of breath      . ALPRAZolam (XANAX) 0.25 MG tablet Take 0.25 mg by mouth 3 (three) times daily as needed for anxiety.       . Alum Hydroxide-Mag Carbonate (GAVISCON PO) Take 1 tablet by mouth daily as needed (stomach).      Marland Kitchen aspirin EC 81 MG tablet Take 81 mg by mouth every morning.       . cetirizine (ZYRTEC) 10 MG tablet Take 10 mg by mouth daily as needed for allergies.      . Cholecalciferol (VITAMIN D) 2000 UNITS tablet Take 2,000 Units by mouth daily with lunch.       . dextromethorphan-guaiFENesin (MUCINEX DM) 30-600 MG per 12 hr tablet  Take 1 tablet by mouth every 12 (twelve) hours.      . Fluticasone-Salmeterol (ADVAIR) 250-50 MCG/DOSE AEPB Inhale 1 puff into the lungs every 12 (twelve) hours.      . fosinopril (MONOPRIL) 20 MG tablet Take 20 mg by mouth daily after breakfast.       . ibuprofen (ADVIL,MOTRIN) 200 MG tablet Take 200 mg by mouth every 8 (eight) hours as needed for pain.      Marland Kitchen levothyroxine (SYNTHROID, LEVOTHROID) 50 MCG tablet Take 50 mcg by mouth daily before breakfast.       . lidocaine-prilocaine (EMLA) cream Apply  topically as needed. Apply to port 1 hour before chemotherapy.  30 g  0  . metoprolol tartrate (LOPRESSOR) 25 MG tablet Take 1 tablet (25 mg total) by mouth 2 (two) times daily.  60 tablet  1  . montelukast (SINGULAIR) 10 MG tablet Take 10 mg by mouth at bedtime.      . ondansetron (ZOFRAN-ODT) 8 MG disintegrating tablet Take 8 mg by mouth every 8 (eight) hours as needed for nausea.       . Polyvinyl Alcohol-Povidone (REFRESH OP) Place 2 drops into both eyes daily as needed (For watery eyes.).      Marland Kitchen PRESCRIPTION MEDICATION She receives her treatments at the [facility] with Dr. Arbutus Ped. She received a 3 day cycle from 07/19/13 to 07/21/13 of Etoposide 200mg  and then an injection of Neulasta 6mg  on 07/22/13.      Marland Kitchen prochlorperazine (COMPAZINE) 10 MG tablet Take 10 mg by mouth every 6 (six) hours as needed (For nausea.).       No current facility-administered medications for this visit.    SURGICAL HISTORY:  Past Surgical History  Procedure Laterality Date  . Cardiac catherization    . Breast lumpectomy    . Tubal ligation    . Video assisted thoracoscopy (vats)/wedge resection Left 11/19/2012    Procedure: VIDEO ASSISTED THORACOSCOPY (VATS)/WEDGE RESECTION;  Surgeon: Loreli Slot, MD;  Location: [facility] OR;  Service: Thoracic;  Laterality: Left;  left upper lobe wedge resection  . Lobectomy Left 11/19/2012    Procedure: LOBECTOMY;  Surgeon: Loreli Slot, MD;  Location: [facility] OR;  Service: Thoracic;  Laterality: Left;  left upper lobe  . Lymph node dissection Left 11/19/2012    Procedure: LYMPH NODE DISSECTION;  Surgeon: Loreli Slot, MD;  Location: [facility] OR;  Service: Thoracic;  Laterality: Left;    REVIEW OF SYSTEMS:  Constitutional: negative Eyes: negative Ears, nose, mouth, throat, and face: negative Respiratory: positive for dyspnea on exertion Cardiovascular: negative Gastrointestinal: negative Genitourinary:negative Integument/breast:  negative Hematologic/lymphatic: negative Musculoskeletal:negative Neurological: negative Behavioral/Psych: negative Endocrine: negative Allergic/Immunologic: negative   PHYSICAL EXAMINATION: General appearance: alert, cooperative and no distress Head: Normocephalic, without obvious abnormality, atraumatic Neck: no adenopathy, no JVD, supple, symmetrical, trachea midline and thyroid not enlarged, symmetric, no tenderness/mass/nodules Lymph nodes: Cervical, supraclavicular, and axillary nodes normal. Resp: clear to auscultation bilaterally Back: symmetric, no curvature. ROM normal. No CVA tenderness. Cardio: regular rate and rhythm, S1, S2 normal, no murmur, click, rub or gallop GI: soft, non-tender; bowel sounds normal; no masses,  no organomegaly Extremities: extremities normal, atraumatic, no cyanosis or edema Neurologic: Alert and oriented X 3, normal strength and tone. Normal symmetric reflexes. Normal coordination and gait   ECOG PERFORMANCE STATUS: 1 - Symptomatic but completely ambulatory  Blood pressure 137/63, pulse 80, temperature 98.4 F (36.9 C), temperature source Oral, resp. rate 18, height 5\' 7"  (1.702 m),  weight 126 lb (57.153 kg), SpO2 100.00%.  LABORATORY DATA: Lab Results  Component Value Date   WBC 12.3* 08/30/2013   HGB 8.1* 08/30/2013   HCT 24.8* 08/30/2013   MCV 90.8 08/30/2013   PLT 205 08/30/2013      Chemistry      Component Value Date/Time   NA 136 08/23/2013 1344   NA 135 08/05/2013 1132   K 3.8 08/23/2013 1344   K 4.0 08/05/2013 1132   CL 101 08/05/2013 1132   CL 102 03/30/2013 0951   CO2 22 08/23/2013 1344   CO2 23 08/05/2013 1132   BUN 9.0 08/23/2013 1344   BUN 13 08/05/2013 1132   CREATININE 0.9 08/23/2013 1344   CREATININE 0.99 08/05/2013 1132      Component Value Date/Time   CALCIUM 9.3 08/23/2013 1344   CALCIUM 10.8* 08/05/2013 1132   ALKPHOS 96 08/23/2013 1344   ALKPHOS 65 11/21/2012 0445   AST 14 08/23/2013 1344   AST 15  11/21/2012 0445   ALT 10 08/23/2013 1344   ALT 16 11/21/2012 0445   BILITOT <0.20 08/23/2013 1344   BILITOT 0.4 11/21/2012 0445       RADIOGRAPHIC STUDIES: Ct Chest W Contrast  08/27/2013   CLINICAL DATA:  Small cell lung cancer (neuroendocrine carcinoma), left upper lobe. Prior left upper lobectomy.  EXAM: CT CHEST, ABDOMEN, AND PELVIS WITH CONTRAST  TECHNIQUE: Multidetector CT imaging of the chest, abdomen and pelvis was performed following the standard protocol during bolus administration of intravenous contrast.  CONTRAST:  OMNIPAQUE IOHEXOL 300 MG/ML  SOLN  COMPARISON:  Multiple exams, including 07/12/2013  FINDINGS:   CT CHEST FINDINGS  AP window abnormal in previously hypermetabolic tissue 0.9 x 1.5, formerly 1.1 x 1.9 cm on 06/29/2013. Anterior mediastinal nodule adjacent the pleural margin, 0.9 by 1.1 cm, previously 1.1 x 1.6 cm on 07/12/2013. Previously seen left pleural/paraspinal nodule is no longer readily apparent or measurable. Small left pleural effusion persists with faint enhancement along pleural margin, but without obvious nodularity.  Atherosclerosis noted, particularly in the aortic arch and left subclavian artery. Postoperative findings inferiorly in the left breast.  Emphysema noted with interstitial accentuation in the apical segment of the right upper lobe. Left upper lobectomy.  Mild thoracic spondylosis is present.    CT ABDOMEN AND PELVIS FINDINGS  Hepatic metastatic lesions are observed in segments 4B, 5, 6, 7, and 8 are in generally reduced in size compared to prior PET-CT. For example, index lesion in segment 8 measures 2 1 x 1.7, formerly 2.4 x 1.9 cm.  1.1 x 1.3 cm left adrenal mass has a precontrast density prior PET-CT highly favoring adenoma  Pancreas, right adrenal gland, and gallbladder unremarkable aortoiliac atherosclerosis is present. Several tiny renal cysts are present.  No pathologic upper abdominal adenopathy is observed. No pathologic pelvic adenopathy  is observed. Chronic calcified uterine fibroids noted.  On prior PET-CT, there was a hypermetabolic lesion the left iliac bone. The corresponding lucent on today's CT Measures 1.1 x 1.0 cm, slightly larger than on the CT data the prior PET-CT or measure the lucency at 0.9 x 1.0 cm.    IMPRESSION: 1. Generalized reduction metastatic lesions in the chest and liver. 2. The left iliac bone lesion measures slightly larger than on the CT data from the prior PET-CT, although given the trend in the other lesions I suspect this is a reflection of response to treatment rather than true enlargement of the underlying lesion.   Electronically Signed  By: Herbie Baltimore M.D.   On: 08/27/2013 08:34   ASSESSMENT AND PLAN: This is a very pleasant 67 years old white female recently diagnosed with neuroendocrine carcinoma, small cell with multiple liver metastases as well as left AP window lymphadenopathy, extrapleural nodal metastasis and left iliac bone lesion. The patient has no evidence for metastatic disease to the brain.  She has a history of neuroendocrine carcinoma, large cell type diagnosed as stage IIA in February of 2014 status post left upper lobectomy followed by 4 cycles of adjuvant chemotherapy with cisplatin and Alimta. She's currently being treated with systemic chemotherapy in the form of carboplatin for an AUC of 5 given on day 1 and etoposide at 120 mg router squared on days 1, 2 and 3 with Neulasta support given on day 4 every 3 weeks, status post 2 cycles.  The patient is tolerating her treatment fairly well with no significant adverse effects. The recent CT scan of the chest, abdomen and pelvis showed improvement in her disease in the chest and liver. I discussed the scan results with the patient and her husband. I recommended for her to proceed with cycle #3 today as scheduled. The patient would come back for followup visit in 3 weeks with the start of cycle #4. She was advised to call immediately  if she has any concerning symptoms in the interval.  The patient voices understanding of current disease status and treatment options and is in agreement with the current care plan.  All questions were answered. The patient knows to call the clinic with any problems, questions or concerns. We can certainly see the patient much sooner if necessary. I spent 15 minutes counseling the patient face to face. The total time spent in the appointment was 25 minutes.   Lajuana Matte., MD 08/30/2013

## 2013-08-30 NOTE — Patient Instructions (Signed)
[facility] Discharge Instructions for Patients Receiving Chemotherapy  Today you received the following chemotherapy agents: Carboplatin and Etoposide   To help prevent nausea and vomiting after your treatment, we encourage you to take your nausea medication as prescribed.    If you develop nausea and vomiting that is not controlled by your nausea medication, call the clinic.   BELOW ARE SYMPTOMS THAT SHOULD BE REPORTED IMMEDIATELY:  *FEVER GREATER THAN 100.5 F  *CHILLS WITH OR WITHOUT FEVER  NAUSEA AND VOMITING THAT IS NOT CONTROLLED WITH YOUR NAUSEA MEDICATION  *UNUSUAL SHORTNESS OF BREATH  *UNUSUAL BRUISING OR BLEEDING  TENDERNESS IN MOUTH AND THROAT WITH OR WITHOUT PRESENCE OF ULCERS  *URINARY PROBLEMS  *BOWEL PROBLEMS  UNUSUAL RASH Items with * indicate a potential emergency and should be followed up as soon as possible.  Feel free to call the clinic you have any questions or concerns. The clinic phone number is [phone].    

## 2013-08-30 NOTE — Patient Instructions (Signed)
CURRENT THERAPY: Systemic chemotherapy with carboplatin for AUC of 5 on day 1 and etoposide at 120 mg/M2 on days 1, 2 and 3 with Neulasta support on day 4 every 3 weeks. First dose of chemotherapy on 07/19/2013. Status post 2 cycles.  CHEMOTHERAPY INTENT: Palliative  CURRENT # OF CHEMOTHERAPY CYCLES: 3  CURRENT ANTIEMETICS: Zofran, dexamethasone and Compazine.  CURRENT SMOKING STATUS: Former smoker  ORAL CHEMOTHERAPY AND CONSENT: None  CURRENT BISPHOSPHONATES USE: None  PAIN MANAGEMENT: 0/10  NARCOTICS INDUCED CONSTIPATION: None  LIVING WILL AND CODE STATUS: ?

## 2013-08-30 NOTE — Telephone Encounter (Signed)
gv and printed appt sched and avs for pt for NOV and DEC....sed added tx. °

## 2013-08-31 ENCOUNTER — Other Ambulatory Visit: Payer: Self-pay | Admitting: *Deleted

## 2013-08-31 ENCOUNTER — Ambulatory Visit (HOSPITAL_BASED_OUTPATIENT_CLINIC_OR_DEPARTMENT_OTHER): Payer: Medicare Other

## 2013-08-31 VITALS — BP 105/62 | HR 63 | Temp 98.1°F | Resp 19 | Ht 67.0 in

## 2013-08-31 DIAGNOSIS — C787 Secondary malignant neoplasm of liver and intrahepatic bile duct: Secondary | ICD-10-CM

## 2013-08-31 DIAGNOSIS — C7B8 Other secondary neuroendocrine tumors: Secondary | ICD-10-CM

## 2013-08-31 DIAGNOSIS — Z5111 Encounter for antineoplastic chemotherapy: Secondary | ICD-10-CM

## 2013-08-31 DIAGNOSIS — C3492 Malignant neoplasm of unspecified part of left bronchus or lung: Secondary | ICD-10-CM

## 2013-08-31 DIAGNOSIS — C7A1 Malignant poorly differentiated neuroendocrine tumors: Secondary | ICD-10-CM

## 2013-08-31 MED ORDER — SODIUM CHLORIDE 0.9 % IJ SOLN
10.0000 mL | INTRAMUSCULAR | Status: DC | PRN
  Administered 2013-08-31: 10 mL
  Filled 2013-08-31: qty 10

## 2013-08-31 MED ORDER — ONDANSETRON 8 MG/50ML IVPB (CHCC)
8.0000 mg | Freq: Once | INTRAVENOUS | Status: AC
  Administered 2013-08-31: 8 mg via INTRAVENOUS

## 2013-08-31 MED ORDER — ETOPOSIDE CHEMO INJECTION 1 GM/50ML
120.0000 mg/m2 | Freq: Once | INTRAVENOUS | Status: AC
  Administered 2013-08-31: 200 mg via INTRAVENOUS
  Filled 2013-08-31: qty 10

## 2013-08-31 MED ORDER — HEPARIN SOD (PORK) LOCK FLUSH 100 UNIT/ML IV SOLN
500.0000 [IU] | Freq: Once | INTRAVENOUS | Status: AC | PRN
  Administered 2013-08-31: 500 [IU]
  Filled 2013-08-31: qty 5

## 2013-08-31 MED ORDER — ONDANSETRON 8 MG/NS 50 ML IVPB
INTRAVENOUS | Status: AC
  Filled 2013-08-31: qty 8

## 2013-08-31 MED ORDER — DEXAMETHASONE SODIUM PHOSPHATE 10 MG/ML IJ SOLN
INTRAMUSCULAR | Status: AC
  Filled 2013-08-31: qty 1

## 2013-08-31 MED ORDER — DEXAMETHASONE SODIUM PHOSPHATE 10 MG/ML IJ SOLN
10.0000 mg | Freq: Once | INTRAMUSCULAR | Status: AC
  Administered 2013-08-31: 10 mg via INTRAVENOUS

## 2013-08-31 MED ORDER — SODIUM CHLORIDE 0.9 % IV SOLN
Freq: Once | INTRAVENOUS | Status: AC
  Administered 2013-08-31: 14:00:00 via INTRAVENOUS

## 2013-08-31 NOTE — Patient Instructions (Signed)
[facility] Discharge Instructions for Patients Receiving Chemotherapy  Today you received the following chemotherapy agents: Etoposide.  To help prevent nausea and vomiting after your treatment, we encourage you to take your nausea medication as prescribed.   If you develop nausea and vomiting that is not controlled by your nausea medication, call the clinic.   BELOW ARE SYMPTOMS THAT SHOULD BE REPORTED IMMEDIATELY:  *FEVER GREATER THAN 100.5 F  *CHILLS WITH OR WITHOUT FEVER  NAUSEA AND VOMITING THAT IS NOT CONTROLLED WITH YOUR NAUSEA MEDICATION  *UNUSUAL SHORTNESS OF BREATH  *UNUSUAL BRUISING OR BLEEDING  TENDERNESS IN MOUTH AND THROAT WITH OR WITHOUT PRESENCE OF ULCERS  *URINARY PROBLEMS  *BOWEL PROBLEMS  UNUSUAL RASH Items with * indicate a potential emergency and should be followed up as soon as possible.  Feel free to call the clinic you have any questions or concerns. The clinic phone number is [phone].    

## 2013-09-01 ENCOUNTER — Ambulatory Visit (HOSPITAL_BASED_OUTPATIENT_CLINIC_OR_DEPARTMENT_OTHER): Payer: Medicare Other

## 2013-09-01 VITALS — BP 143/75 | HR 67 | Temp 97.1°F | Resp 18

## 2013-09-01 DIAGNOSIS — C3492 Malignant neoplasm of unspecified part of left bronchus or lung: Secondary | ICD-10-CM

## 2013-09-01 DIAGNOSIS — Z5111 Encounter for antineoplastic chemotherapy: Secondary | ICD-10-CM

## 2013-09-01 DIAGNOSIS — C7A1 Malignant poorly differentiated neuroendocrine tumors: Secondary | ICD-10-CM

## 2013-09-01 DIAGNOSIS — C7B8 Other secondary neuroendocrine tumors: Secondary | ICD-10-CM

## 2013-09-01 DIAGNOSIS — C787 Secondary malignant neoplasm of liver and intrahepatic bile duct: Secondary | ICD-10-CM

## 2013-09-01 MED ORDER — ONDANSETRON 8 MG/50ML IVPB (CHCC)
8.0000 mg | Freq: Once | INTRAVENOUS | Status: AC
  Administered 2013-09-01: 8 mg via INTRAVENOUS

## 2013-09-01 MED ORDER — HEPARIN SOD (PORK) LOCK FLUSH 100 UNIT/ML IV SOLN
500.0000 [IU] | Freq: Once | INTRAVENOUS | Status: AC | PRN
  Administered 2013-09-01: 500 [IU]
  Filled 2013-09-01: qty 5

## 2013-09-01 MED ORDER — SODIUM CHLORIDE 0.9 % IV SOLN
Freq: Once | INTRAVENOUS | Status: AC
  Administered 2013-09-01: 14:00:00 via INTRAVENOUS

## 2013-09-01 MED ORDER — DEXAMETHASONE SODIUM PHOSPHATE 10 MG/ML IJ SOLN
INTRAMUSCULAR | Status: AC
  Filled 2013-09-01: qty 1

## 2013-09-01 MED ORDER — SODIUM CHLORIDE 0.9 % IV SOLN
120.0000 mg/m2 | Freq: Once | INTRAVENOUS | Status: AC
  Administered 2013-09-01: 200 mg via INTRAVENOUS
  Filled 2013-09-01: qty 10

## 2013-09-01 MED ORDER — ONDANSETRON 8 MG/NS 50 ML IVPB
INTRAVENOUS | Status: AC
  Filled 2013-09-01: qty 8

## 2013-09-01 MED ORDER — DEXAMETHASONE SODIUM PHOSPHATE 10 MG/ML IJ SOLN
10.0000 mg | Freq: Once | INTRAMUSCULAR | Status: AC
  Administered 2013-09-01: 10 mg via INTRAVENOUS

## 2013-09-01 MED ORDER — SODIUM CHLORIDE 0.9 % IJ SOLN
10.0000 mL | INTRAMUSCULAR | Status: DC | PRN
  Administered 2013-09-01: 10 mL
  Filled 2013-09-01: qty 10

## 2013-09-01 NOTE — Patient Instructions (Signed)
[facility] Discharge Instructions for Patients Receiving Chemotherapy  Today you received the following chemotherapy agent: Etoposide   To help prevent nausea and vomiting after your treatment, we encourage you to take your nausea medication as prescribed.   If you develop nausea and vomiting that is not controlled by your nausea medication, call the clinic.   BELOW ARE SYMPTOMS THAT SHOULD BE REPORTED IMMEDIATELY:  *FEVER GREATER THAN 100.5 F  *CHILLS WITH OR WITHOUT FEVER  NAUSEA AND VOMITING THAT IS NOT CONTROLLED WITH YOUR NAUSEA MEDICATION  *UNUSUAL SHORTNESS OF BREATH  *UNUSUAL BRUISING OR BLEEDING  TENDERNESS IN MOUTH AND THROAT WITH OR WITHOUT PRESENCE OF ULCERS  *URINARY PROBLEMS  *BOWEL PROBLEMS  UNUSUAL RASH Items with * indicate a potential emergency and should be followed up as soon as possible.  Feel free to call the clinic you have any questions or concerns. The clinic phone number is [phone].    

## 2013-09-02 ENCOUNTER — Ambulatory Visit (HOSPITAL_BASED_OUTPATIENT_CLINIC_OR_DEPARTMENT_OTHER): Payer: Medicare Other

## 2013-09-02 VITALS — BP 152/61 | HR 66 | Temp 97.4°F | Resp 18

## 2013-09-02 DIAGNOSIS — C787 Secondary malignant neoplasm of liver and intrahepatic bile duct: Secondary | ICD-10-CM

## 2013-09-02 DIAGNOSIS — C3492 Malignant neoplasm of unspecified part of left bronchus or lung: Secondary | ICD-10-CM

## 2013-09-02 DIAGNOSIS — C7B8 Other secondary neuroendocrine tumors: Secondary | ICD-10-CM

## 2013-09-02 DIAGNOSIS — C7A1 Malignant poorly differentiated neuroendocrine tumors: Secondary | ICD-10-CM

## 2013-09-02 MED ORDER — PEGFILGRASTIM INJECTION 6 MG/0.6ML
6.0000 mg | Freq: Once | SUBCUTANEOUS | Status: AC
  Administered 2013-09-02: 6 mg via SUBCUTANEOUS
  Filled 2013-09-02: qty 0.6

## 2013-09-06 ENCOUNTER — Other Ambulatory Visit (HOSPITAL_BASED_OUTPATIENT_CLINIC_OR_DEPARTMENT_OTHER): Payer: Medicare Other | Admitting: Lab

## 2013-09-06 DIAGNOSIS — C3492 Malignant neoplasm of unspecified part of left bronchus or lung: Secondary | ICD-10-CM

## 2013-09-06 DIAGNOSIS — C7A1 Malignant poorly differentiated neuroendocrine tumors: Secondary | ICD-10-CM

## 2013-09-06 DIAGNOSIS — C787 Secondary malignant neoplasm of liver and intrahepatic bile duct: Secondary | ICD-10-CM

## 2013-09-06 LAB — COMPREHENSIVE METABOLIC PANEL (CC13)
ALT: 10 U/L (ref 0–55)
AST: 14 U/L (ref 5–34)
Albumin: 3.2 g/dL — ABNORMAL LOW (ref 3.5–5.0)
Alkaline Phosphatase: 110 U/L (ref 40–150)
Anion Gap: 6 mEq/L (ref 3–11)
BUN: 24.9 mg/dL (ref 7.0–26.0)
CO2: 21 mEq/L — ABNORMAL LOW (ref 22–29)
Calcium: 9.1 mg/dL (ref 8.4–10.4)
Chloride: 103 mEq/L (ref 98–109)
Potassium: 4.3 mEq/L (ref 3.5–5.1)
Sodium: 131 mEq/L — ABNORMAL LOW (ref 136–145)
Total Protein: 6.2 g/dL — ABNORMAL LOW (ref 6.4–8.3)

## 2013-09-06 LAB — CBC WITH DIFFERENTIAL/PLATELET
BASO%: 0.6 % (ref 0.0–2.0)
Basophils Absolute: 0.1 10*3/uL (ref 0.0–0.1)
EOS%: 0.6 % (ref 0.0–7.0)
HCT: 21.5 % — ABNORMAL LOW (ref 34.8–46.6)
MCHC: 33.6 g/dL (ref 31.5–36.0)
MONO#: 0.3 10*3/uL (ref 0.1–0.9)
NEUT#: 7.6 10*3/uL — ABNORMAL HIGH (ref 1.5–6.5)
NEUT%: 82.8 % — ABNORMAL HIGH (ref 38.4–76.8)
RBC: 2.35 10*6/uL — ABNORMAL LOW (ref 3.70–5.45)
RDW: 15.7 % — ABNORMAL HIGH (ref 11.2–14.5)
WBC: 9.2 10*3/uL (ref 3.9–10.3)
lymph#: 1.2 10*3/uL (ref 0.9–3.3)

## 2013-09-06 NOTE — Progress Notes (Signed)
Quick Note:  Call patient with the result and arrange for 2 units of PRBCs transfusion. ______ 

## 2013-09-07 ENCOUNTER — Telehealth: Payer: Self-pay | Admitting: Internal Medicine

## 2013-09-07 ENCOUNTER — Other Ambulatory Visit: Payer: Medicare Other | Admitting: Lab

## 2013-09-07 ENCOUNTER — Telehealth: Payer: Self-pay | Admitting: *Deleted

## 2013-09-07 ENCOUNTER — Ambulatory Visit (HOSPITAL_COMMUNITY)
Admission: RE | Admit: 2013-09-07 | Discharge: 2013-09-07 | Disposition: A | Discharge status: Home / Self Care | Payer: Medicare Other | Source: Ambulatory Visit | Attending: Internal Medicine | Admitting: Internal Medicine

## 2013-09-07 ENCOUNTER — Other Ambulatory Visit: Payer: Self-pay | Admitting: *Deleted

## 2013-09-07 DIAGNOSIS — D649 Anemia, unspecified: Secondary | ICD-10-CM

## 2013-09-07 NOTE — Telephone Encounter (Signed)
Message copied by Caren Griffins on Tue Sep 07, 2013 11:31 AM ------      Message from: Si Gaul      Created: Mon Sep 06, 2013  5:35 PM       Call patient with the result and arrange for 2 units of PRBCs transfusion. ------

## 2013-09-07 NOTE — Telephone Encounter (Signed)
Pt is aware of transfusion appt this Friday 11/28.

## 2013-09-07 NOTE — Telephone Encounter (Signed)
added pt on for 11.28.14.Marland KitchenMarland KitchenStephanie contacted pt with appt d/t..pt ok and awre

## 2013-09-10 ENCOUNTER — Ambulatory Visit: Payer: Medicare Other | Admitting: Lab

## 2013-09-10 ENCOUNTER — Ambulatory Visit (HOSPITAL_BASED_OUTPATIENT_CLINIC_OR_DEPARTMENT_OTHER): Payer: Medicare Other

## 2013-09-10 VITALS — BP 143/64 | HR 64 | Temp 98.1°F | Resp 18

## 2013-09-10 DIAGNOSIS — C3492 Malignant neoplasm of unspecified part of left bronchus or lung: Secondary | ICD-10-CM

## 2013-09-10 DIAGNOSIS — D649 Anemia, unspecified: Secondary | ICD-10-CM

## 2013-09-10 LAB — PREPARE RBC (CROSSMATCH)

## 2013-09-10 LAB — ABO/RH: ABO/RH(D): A POS

## 2013-09-10 MED ORDER — SODIUM CHLORIDE 0.9 % IV SOLN
250.0000 mL | Freq: Once | INTRAVENOUS | Status: AC
  Administered 2013-09-10: 250 mL via INTRAVENOUS

## 2013-09-10 MED ORDER — SODIUM CHLORIDE 0.9 % IJ SOLN
10.0000 mL | INTRAMUSCULAR | Status: AC | PRN
  Administered 2013-09-10: 10 mL
  Filled 2013-09-10: qty 10

## 2013-09-10 MED ORDER — DIPHENHYDRAMINE HCL 25 MG PO CAPS
25.0000 mg | ORAL_CAPSULE | Freq: Once | ORAL | Status: AC
  Administered 2013-09-10: 25 mg via ORAL

## 2013-09-10 MED ORDER — ACETAMINOPHEN 325 MG PO TABS
ORAL_TABLET | ORAL | Status: AC
  Filled 2013-09-10: qty 2

## 2013-09-10 MED ORDER — HEPARIN SOD (PORK) LOCK FLUSH 100 UNIT/ML IV SOLN
500.0000 [IU] | Freq: Every day | INTRAVENOUS | Status: AC | PRN
  Administered 2013-09-10: 500 [IU]
  Filled 2013-09-10: qty 5

## 2013-09-10 MED ORDER — ACETAMINOPHEN 325 MG PO TABS
650.0000 mg | ORAL_TABLET | Freq: Once | ORAL | Status: AC
  Administered 2013-09-10: 650 mg via ORAL

## 2013-09-10 MED ORDER — DIPHENHYDRAMINE HCL 25 MG PO CAPS
ORAL_CAPSULE | ORAL | Status: AC
  Filled 2013-09-10: qty 1

## 2013-09-10 NOTE — Patient Instructions (Addendum)
Blood Transfusion Information WHAT IS A BLOOD TRANSFUSION? A transfusion is the replacement of blood or some of its parts. Blood is made up of multiple cells which provide different functions.  Red blood cells carry oxygen and are used for blood loss replacement.  White blood cells fight against infection.  Platelets control bleeding.  Plasma helps clot blood.  Other blood products are available for specialized needs, such as hemophilia or other clotting disorders. BEFORE THE TRANSFUSION  Who gives blood for transfusions?   You may be able to donate blood to be used at a later date on yourself (autologous donation).  Relatives can be asked to donate blood. This is generally not any safer than if you have received blood from a stranger. The same precautions are taken to ensure safety when a relative's blood is donated.  Healthy volunteers who are fully evaluated to make sure their blood is safe. This is blood bank blood. Transfusion therapy is the safest it has ever been in the practice of medicine. Before blood is taken from a donor, a complete history is taken to make sure that person has no history of diseases nor engages in risky social behavior (examples are intravenous drug use or sexual activity with multiple partners). The donor's travel history is screened to minimize risk of transmitting infections, such as malaria. The donated blood is tested for signs of infectious diseases, such as HIV and hepatitis. The blood is then tested to be sure it is compatible with you in order to minimize the chance of a transfusion reaction. If you or a relative donates blood, this is often done in anticipation of surgery and is not appropriate for emergency situations. It takes many days to process the donated blood. RISKS AND COMPLICATIONS Although transfusion therapy is very safe and saves many lives, the main dangers of transfusion include:   Getting an infectious disease.  Developing a  transfusion reaction. This is an allergic reaction to something in the blood you were given. Every precaution is taken to prevent this. The decision to have a blood transfusion has been considered carefully by your caregiver before blood is given. Blood is not given unless the benefits outweigh the risks. AFTER THE TRANSFUSION  Right after receiving a blood transfusion, you will usually feel much better and more energetic. This is especially true if your red blood cells have gotten low (anemic). The transfusion raises the level of the red blood cells which carry oxygen, and this usually causes an energy increase.  The nurse administering the transfusion will monitor you carefully for complications. HOME CARE INSTRUCTIONS  No special instructions are needed after a transfusion. You may find your energy is better. Speak with your caregiver about any limitations on activity for underlying diseases you may have. SEEK MEDICAL CARE IF:   Your condition is not improving after your transfusion.  You develop redness or irritation at the intravenous (IV) site. SEEK IMMEDIATE MEDICAL CARE IF:  Any of the following symptoms occur over the next 12 hours:  Shaking chills.  You have a temperature by mouth above 102 F (38.9 C), not controlled by medicine.  Chest, back, or muscle pain.  People around you feel you are not acting correctly or are confused.  Shortness of breath or difficulty breathing.  Dizziness and fainting.  You get a rash or develop hives.  You have a decrease in urine output.  Your urine turns a dark color or changes to pink, red, or brown. Any of the following   symptoms occur over the next 10 days:  You have a temperature by mouth above 102 F (38.9 C), not controlled by medicine.  Shortness of breath.  Weakness after normal activity.  The white part of the eye turns yellow (jaundice).  You have a decrease in the amount of urine or are urinating less often.  Your  urine turns a dark color or changes to pink, red, or brown. Document Released: 09/27/2000 Document Revised: 12/23/2011 Document Reviewed: 05/16/2008 Exit[employer] Patient Information 2014 [employer].  

## 2013-09-11 LAB — TYPE AND SCREEN
ABO/RH(D): A POS
Antibody Screen: NEGATIVE
Unit division: 0

## 2013-09-13 ENCOUNTER — Other Ambulatory Visit (HOSPITAL_BASED_OUTPATIENT_CLINIC_OR_DEPARTMENT_OTHER): Payer: Medicare Other

## 2013-09-13 DIAGNOSIS — C7A1 Malignant poorly differentiated neuroendocrine tumors: Secondary | ICD-10-CM

## 2013-09-13 DIAGNOSIS — C3492 Malignant neoplasm of unspecified part of left bronchus or lung: Secondary | ICD-10-CM

## 2013-09-13 DIAGNOSIS — C7B8 Other secondary neuroendocrine tumors: Secondary | ICD-10-CM

## 2013-09-13 DIAGNOSIS — C787 Secondary malignant neoplasm of liver and intrahepatic bile duct: Secondary | ICD-10-CM

## 2013-09-13 LAB — CBC WITH DIFFERENTIAL/PLATELET
BASO%: 0.3 % (ref 0.0–2.0)
Basophils Absolute: 0 10*3/uL (ref 0.0–0.1)
EOS%: 1.7 % (ref 0.0–7.0)
Eosinophils Absolute: 0.1 10*3/uL (ref 0.0–0.5)
HCT: 27.4 % — ABNORMAL LOW (ref 34.8–46.6)
LYMPH%: 20 % (ref 14.0–49.7)
MCH: 29.8 pg (ref 25.1–34.0)
MCHC: 33.9 g/dL (ref 31.5–36.0)
MONO#: 0.6 10*3/uL (ref 0.1–0.9)
NEUT#: 4.4 10*3/uL (ref 1.5–6.5)
NEUT%: 68.4 % (ref 38.4–76.8)
Platelets: 17 10*3/uL — ABNORMAL LOW (ref 145–400)
RBC: 3.12 10*6/uL — ABNORMAL LOW (ref 3.70–5.45)
WBC: 6.5 10*3/uL (ref 3.9–10.3)
lymph#: 1.3 10*3/uL (ref 0.9–3.3)
nRBC: 0 % (ref 0–0)

## 2013-09-13 LAB — COMPREHENSIVE METABOLIC PANEL (CC13)
ALT: 10 U/L (ref 0–55)
AST: 12 U/L (ref 5–34)
Alkaline Phosphatase: 97 U/L (ref 40–150)
Anion Gap: 8 mEq/L (ref 3–11)
BUN: 12.8 mg/dL (ref 7.0–26.0)
Chloride: 103 mEq/L (ref 98–109)
Creatinine: 0.9 mg/dL (ref 0.6–1.1)
Glucose: 157 mg/dl — ABNORMAL HIGH (ref 70–140)
Sodium: 136 mEq/L (ref 136–145)

## 2013-09-20 ENCOUNTER — Ambulatory Visit (HOSPITAL_BASED_OUTPATIENT_CLINIC_OR_DEPARTMENT_OTHER): Payer: Medicare Other | Admitting: Physician Assistant

## 2013-09-20 ENCOUNTER — Other Ambulatory Visit: Payer: Medicare Other

## 2013-09-20 ENCOUNTER — Ambulatory Visit: Payer: Medicare Other | Admitting: Internal Medicine

## 2013-09-20 ENCOUNTER — Telehealth: Payer: Self-pay | Admitting: Internal Medicine

## 2013-09-20 ENCOUNTER — Telehealth: Payer: Self-pay | Admitting: *Deleted

## 2013-09-20 ENCOUNTER — Ambulatory Visit (HOSPITAL_BASED_OUTPATIENT_CLINIC_OR_DEPARTMENT_OTHER): Payer: Medicare Other

## 2013-09-20 ENCOUNTER — Other Ambulatory Visit: Payer: Medicare Other | Admitting: Lab

## 2013-09-20 ENCOUNTER — Encounter: Payer: Self-pay | Admitting: Physician Assistant

## 2013-09-20 ENCOUNTER — Other Ambulatory Visit (HOSPITAL_BASED_OUTPATIENT_CLINIC_OR_DEPARTMENT_OTHER): Payer: Medicare Other | Admitting: Lab

## 2013-09-20 VITALS — BP 128/65 | HR 74 | Temp 97.8°F | Resp 17 | Ht 67.0 in | Wt 126.2 lb

## 2013-09-20 DIAGNOSIS — C7A1 Malignant poorly differentiated neuroendocrine tumors: Secondary | ICD-10-CM

## 2013-09-20 DIAGNOSIS — C787 Secondary malignant neoplasm of liver and intrahepatic bile duct: Secondary | ICD-10-CM

## 2013-09-20 DIAGNOSIS — C3490 Malignant neoplasm of unspecified part of unspecified bronchus or lung: Secondary | ICD-10-CM

## 2013-09-20 DIAGNOSIS — C3492 Malignant neoplasm of unspecified part of left bronchus or lung: Secondary | ICD-10-CM

## 2013-09-20 DIAGNOSIS — K5900 Constipation, unspecified: Secondary | ICD-10-CM

## 2013-09-20 DIAGNOSIS — Z5111 Encounter for antineoplastic chemotherapy: Secondary | ICD-10-CM

## 2013-09-20 DIAGNOSIS — C7B8 Other secondary neuroendocrine tumors: Secondary | ICD-10-CM

## 2013-09-20 LAB — COMPREHENSIVE METABOLIC PANEL (CC13)
AST: 15 U/L (ref 5–34)
Albumin: 3.2 g/dL — ABNORMAL LOW (ref 3.5–5.0)
Alkaline Phosphatase: 89 U/L (ref 40–150)
BUN: 13.2 mg/dL (ref 7.0–26.0)
CO2: 24 mEq/L (ref 22–29)
Creatinine: 1 mg/dL (ref 0.6–1.1)
Glucose: 81 mg/dl (ref 70–140)
Potassium: 4.2 mEq/L (ref 3.5–5.1)
Total Bilirubin: 0.34 mg/dL (ref 0.20–1.20)
Total Protein: 6.5 g/dL (ref 6.4–8.3)

## 2013-09-20 LAB — CBC WITH DIFFERENTIAL/PLATELET
Basophils Absolute: 0 10*3/uL (ref 0.0–0.1)
EOS%: 1.1 % (ref 0.0–7.0)
Eosinophils Absolute: 0.1 10*3/uL (ref 0.0–0.5)
HCT: 27.5 % — ABNORMAL LOW (ref 34.8–46.6)
HGB: 9.2 g/dL — ABNORMAL LOW (ref 11.6–15.9)
LYMPH%: 21 % (ref 14.0–49.7)
MCH: 29.8 pg (ref 25.1–34.0)
MCV: 89 fL (ref 79.5–101.0)
MONO#: 0.7 10*3/uL (ref 0.1–0.9)
MONO%: 8 % (ref 0.0–14.0)
NEUT#: 6.3 10*3/uL (ref 1.5–6.5)
NEUT%: 69.5 % (ref 38.4–76.8)
Platelets: 94 10*3/uL — ABNORMAL LOW (ref 145–400)
RDW: 16.1 % — ABNORMAL HIGH (ref 11.2–14.5)

## 2013-09-20 MED ORDER — SODIUM CHLORIDE 0.9 % IV SOLN
100.0000 mg/m2 | Freq: Once | INTRAVENOUS | Status: AC
  Administered 2013-09-20: 160 mg via INTRAVENOUS
  Filled 2013-09-20: qty 8

## 2013-09-20 MED ORDER — SODIUM CHLORIDE 0.9 % IV SOLN
Freq: Once | INTRAVENOUS | Status: AC
  Administered 2013-09-20: 13:00:00 via INTRAVENOUS

## 2013-09-20 MED ORDER — SODIUM CHLORIDE 0.9 % IV SOLN
297.6000 mg | Freq: Once | INTRAVENOUS | Status: AC
  Administered 2013-09-20: 300 mg via INTRAVENOUS
  Filled 2013-09-20: qty 30

## 2013-09-20 MED ORDER — DEXAMETHASONE SODIUM PHOSPHATE 20 MG/5ML IJ SOLN
20.0000 mg | Freq: Once | INTRAMUSCULAR | Status: AC
  Administered 2013-09-20: 20 mg via INTRAVENOUS

## 2013-09-20 MED ORDER — ONDANSETRON 16 MG/50ML IVPB (CHCC)
16.0000 mg | Freq: Once | INTRAVENOUS | Status: AC
  Administered 2013-09-20: 16 mg via INTRAVENOUS

## 2013-09-20 MED ORDER — SODIUM CHLORIDE 0.9 % IJ SOLN
10.0000 mL | INTRAMUSCULAR | Status: DC | PRN
  Administered 2013-09-20: 10 mL
  Filled 2013-09-20: qty 10

## 2013-09-20 MED ORDER — DEXAMETHASONE SODIUM PHOSPHATE 20 MG/5ML IJ SOLN
INTRAMUSCULAR | Status: AC
  Filled 2013-09-20: qty 5

## 2013-09-20 MED ORDER — HEPARIN SOD (PORK) LOCK FLUSH 100 UNIT/ML IV SOLN
500.0000 [IU] | Freq: Once | INTRAVENOUS | Status: AC | PRN
  Administered 2013-09-20: 500 [IU]
  Filled 2013-09-20: qty 5

## 2013-09-20 NOTE — Patient Instructions (Signed)
[facility] Discharge Instructions for Patients Receiving Chemotherapy  Today you received the following chemotherapy agents; Carboplatin and Etoposide (VP-16).  To help prevent nausea and vomiting after your treatment, we encourage you to take your nausea medication as directed.    If you develop nausea and vomiting that is not controlled by your nausea medication, call the clinic.   BELOW ARE SYMPTOMS THAT SHOULD BE REPORTED IMMEDIATELY:  *FEVER GREATER THAN 100.5 F  *CHILLS WITH OR WITHOUT FEVER  NAUSEA AND VOMITING THAT IS NOT CONTROLLED WITH YOUR NAUSEA MEDICATION  *UNUSUAL SHORTNESS OF BREATH  *UNUSUAL BRUISING OR BLEEDING  TENDERNESS IN MOUTH AND THROAT WITH OR WITHOUT PRESENCE OF ULCERS  *URINARY PROBLEMS  *BOWEL PROBLEMS  UNUSUAL RASH Items with * indicate a potential emergency and should be followed up as soon as possible.  Feel free to call the clinic you have any questions or concerns. The clinic phone number is [phone].    

## 2013-09-20 NOTE — Telephone Encounter (Signed)
Per staff message and POF I have scheduled appts. Appt scheduled for 12/29, but first available is 3pm, scheduler notified. JMW

## 2013-09-20 NOTE — Telephone Encounter (Signed)
appts made per 12/8 POF Email to MW to add 12/29 TX Barium given AVS and CAL given shh

## 2013-09-20 NOTE — Patient Instructions (Signed)
Continue weekly labs as scheduled Follow Dr. Arbutus Ped in 3 weeks with restaging CT scan of your chest, abdomen and pelvis to reevaluate your disease

## 2013-09-20 NOTE — Progress Notes (Signed)
Per Tiana Loft, PA-C: OK to treat with PLT 94k. Doses are being reduced.

## 2013-09-20 NOTE — Progress Notes (Addendum)
[facility] [phone]   [phone]  SHARED VISIT PROGRESS NOTE  Juline Patch, MD 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   PRIOR THERAPY:  1) Status post left upper lobectomy with lymph node dissection.  2) Adjuvant systemic chemotherapy with cisplatin 75 mg region squared and Alimta 500 mg per meter squared given every 3 weeks for total of 4 cycles, status post 4 cycles. Last dose was given 03/02/2013.   CURRENT THERAPY: Systemic chemotherapy with carboplatin for AUC of 5 on day 1 and etoposide at 120 mg/M2 on days 1, 2 and 3 with Neulasta support on day 4 every 3 weeks. First dose of chemotherapy on 07/19/2013. Status post 3 cycles. 4 seek for cycle 4 forward the carboplatin will be reduced for an AUC of 5 and etoposide at reduced to 100 mg router squared given on days 1, 2 and 3 with Neulasta support given on day 4  CHEMOTHERAPY INTENT: Palliative  CURRENT # OF CHEMOTHERAPY CYCLES: 4  CURRENT ANTIEMETICS: Zofran, dexamethasone and Compazine.  CURRENT SMOKING STATUS: Former smoker  ORAL CHEMOTHERAPY AND CONSENT: None  CURRENT BISPHOSPHONATES USE: None  PAIN MANAGEMENT: 0/10  NARCOTICS INDUCED CONSTIPATION: None  LIVING WILL AND CODE STATUS: ?    INTERVAL HISTORY: Sara Mcclure 67 y.o. female returns to the clinic today for followup visit accompanied by her husband. She presents to proceed with cycle #3 of systemic chemotherapy of carboplatin and etoposide with Neulasta support. She tolerated the first 3 cycles relatively well with the exception of constipation.  She denied having any significant chest  pain, shortness of breath, or hemoptysis. She reports increased cough with eating. This seems to be a bit better with the Mucinex that she's been taking. She also notes some mild bruising but this is at a baseline level for her.  She has no weight loss or night sweats. The patient denied having any nausea or vomiting. She has no fever or chills.   MEDICAL HISTORY: Past Medical History  Diagnosis Date  . GERD (gastroesophageal reflux disease)   . Asthma   . Hypertension   . Diabetes mellitus   . Thyroid disease   . Anxiety   . Vertigo   . Breast cancer   . COPD (chronic obstructive pulmonary disease)   . PONV (postoperative nausea and vomiting)     ALLERGIES:  is allergic to metformin and related.  MEDICATIONS:  Current Outpatient Prescriptions  Medication Sig Dispense Refill  . albuterol (ACCUNEB) 1.25 MG/3ML nebulizer solution Take 1 ampule by nebulization 2 (two) times daily as needed. For shortness of breath      . albuterol (PROVENTIL HFA;VENTOLIN HFA) 108 (90 BASE) MCG/ACT inhaler Inhale 2 puffs into the lungs every 6 (six) hours as needed. For shortness of breath      . ALPRAZolam (XANAX) 0.25 MG tablet Take 0.25 mg by mouth 3 (three) times daily as needed for anxiety.       . Alum Hydroxide-Mag Carbonate (GAVISCON PO) Take 1 tablet by mouth daily as needed (stomach).      Marland Kitchen aspirin EC 81 MG tablet Take 81 mg by mouth every morning.       . Cholecalciferol (VITAMIN  D) 2000 UNITS tablet Take 2,000 Units by mouth daily with lunch.       . dextromethorphan-guaiFENesin (MUCINEX DM) 30-600 MG per 12 hr tablet Take 1 tablet by mouth every 12 (twelve) hours.      . Fluticasone-Salmeterol (ADVAIR) 250-50 MCG/DOSE AEPB Inhale 1 puff into the lungs every 12 (twelve) hours.      . fosinopril (MONOPRIL) 20 MG tablet Take 20 mg by mouth daily after breakfast.       . levothyroxine (SYNTHROID, LEVOTHROID) 50 MCG tablet Take 50 mcg by mouth daily before breakfast.       . lidocaine-prilocaine  (EMLA) cream Apply topically as needed. Apply to port 1 hour before chemotherapy.  30 g  0  . metoprolol tartrate (LOPRESSOR) 25 MG tablet Take 1 tablet (25 mg total) by mouth 2 (two) times daily.  60 tablet  1  . montelukast (SINGULAIR) 10 MG tablet Take 10 mg by mouth at bedtime.      Marland Kitchen PRESCRIPTION MEDICATION She receives her treatments at the [facility] with Dr. Arbutus Ped. She received a 3 day cycle from 07/19/13 to 07/21/13 of Etoposide 200mg  and then an injection of Neulasta 6mg  on 07/22/13.      . cetirizine (ZYRTEC) 10 MG tablet Take 10 mg by mouth daily as needed for allergies.      Marland Kitchen ibuprofen (ADVIL,MOTRIN) 200 MG tablet Take 200 mg by mouth every 8 (eight) hours as needed for pain.      Marland Kitchen ondansetron (ZOFRAN-ODT) 8 MG disintegrating tablet Take 8 mg by mouth every 8 (eight) hours as needed for nausea.       . Polyvinyl Alcohol-Povidone (REFRESH OP) Place 2 drops into both eyes daily as needed (For watery eyes.).      Marland Kitchen prochlorperazine (COMPAZINE) 10 MG tablet Take 10 mg by mouth every 6 (six) hours as needed (For nausea.).       No current facility-administered medications for this visit.    SURGICAL HISTORY:  Past Surgical History  Procedure Laterality Date  . Cardiac catherization    . Breast lumpectomy    . Tubal ligation    . Video assisted thoracoscopy (vats)/wedge resection Left 11/19/2012    Procedure: VIDEO ASSISTED THORACOSCOPY (VATS)/WEDGE RESECTION;  Surgeon: Loreli Slot, MD;  Location: [facility] OR;  Service: Thoracic;  Laterality: Left;  left upper lobe wedge resection  . Lobectomy Left 11/19/2012    Procedure: LOBECTOMY;  Surgeon: Loreli Slot, MD;  Location: [facility] OR;  Service: Thoracic;  Laterality: Left;  left upper lobe  . Lymph node dissection Left 11/19/2012    Procedure: LYMPH NODE DISSECTION;  Surgeon: Loreli Slot, MD;  Location: [facility] OR;  Service: Thoracic;  Laterality: Left;    REVIEW OF SYSTEMS:  Constitutional: negative Eyes:  negative Ears, nose, mouth, throat, and face: negative Respiratory: positive for cough and dyspnea on exertion Cardiovascular: negative Gastrointestinal: negative Genitourinary:negative Integument/breast: negative Hematologic/lymphatic: negative Musculoskeletal:negative Neurological: negative Behavioral/Psych: negative Endocrine: negative Allergic/Immunologic: negative   PHYSICAL EXAMINATION: General appearance: alert, cooperative and no distress Head: Normocephalic, without obvious abnormality, atraumatic Neck: no adenopathy, no JVD, supple, symmetrical, trachea midline and thyroid not enlarged, symmetric, no tenderness/mass/nodules Lymph nodes: Cervical, supraclavicular, and axillary nodes normal. Resp: clear to auscultation bilaterally Back: symmetric, no curvature. ROM normal. No CVA tenderness. Cardio: regular rate and rhythm, S1, S2 normal, no murmur, click, rub or gallop GI: soft, non-tender; bowel sounds normal; no masses,  no organomegaly Extremities: extremities normal, atraumatic, no cyanosis or  edema Neurologic: Alert and oriented X 3, normal strength and tone. Normal symmetric reflexes. Normal coordination and gait Skin: Scattered small areas of ecchymosis on the forearms with varying degrees of resolution  ECOG PERFORMANCE STATUS: 1 - Symptomatic but completely ambulatory  Blood pressure 128/65, pulse 74, temperature 97.8 F (36.6 C), temperature source Oral, resp. rate 17, height 5\' 7"  (1.702 m), weight 126 lb 3.2 oz (57.244 kg), SpO2 96.00%.  LABORATORY DATA: Lab Results  Component Value Date   WBC 9.1 09/20/2013   HGB 9.2* 09/20/2013   HCT 27.5* 09/20/2013   MCV 89.0 09/20/2013   PLT 94* 09/20/2013      Chemistry      Component Value Date/Time   NA 137 09/20/2013 1104   NA 135 08/05/2013 1132   K 4.2 09/20/2013 1104   K 4.0 08/05/2013 1132   CL 101 08/05/2013 1132   CL 102 03/30/2013 0951   CO2 24 09/20/2013 1104   CO2 23 08/05/2013 1132   BUN 13.2  09/20/2013 1104   BUN 13 08/05/2013 1132   CREATININE 1.0 09/20/2013 1104   CREATININE 0.99 08/05/2013 1132      Component Value Date/Time   CALCIUM 9.7 09/20/2013 1104   CALCIUM 10.8* 08/05/2013 1132   ALKPHOS 89 09/20/2013 1104   ALKPHOS 65 11/21/2012 0445   AST 15 09/20/2013 1104   AST 15 11/21/2012 0445   ALT 11 09/20/2013 1104   ALT 16 11/21/2012 0445   BILITOT 0.34 09/20/2013 1104   BILITOT 0.4 11/21/2012 0445       RADIOGRAPHIC STUDIES: Ct Chest W Contrast  08/27/2013   CLINICAL DATA:  Small cell lung cancer (neuroendocrine carcinoma), left upper lobe. Prior left upper lobectomy.  EXAM: CT CHEST, ABDOMEN, AND PELVIS WITH CONTRAST  TECHNIQUE: Multidetector CT imaging of the chest, abdomen and pelvis was performed following the standard protocol during bolus administration of intravenous contrast.  CONTRAST:  OMNIPAQUE IOHEXOL 300 MG/ML  SOLN  COMPARISON:  Multiple exams, including 07/12/2013  FINDINGS:   CT CHEST FINDINGS  AP window abnormal in previously hypermetabolic tissue 0.9 x 1.5, formerly 1.1 x 1.9 cm on 06/29/2013. Anterior mediastinal nodule adjacent the pleural margin, 0.9 by 1.1 cm, previously 1.1 x 1.6 cm on 07/12/2013. Previously seen left pleural/paraspinal nodule is no longer readily apparent or measurable. Small left pleural effusion persists with faint enhancement along pleural margin, but without obvious nodularity.  Atherosclerosis noted, particularly in the aortic arch and left subclavian artery. Postoperative findings inferiorly in the left breast.  Emphysema noted with interstitial accentuation in the apical segment of the right upper lobe. Left upper lobectomy.  Mild thoracic spondylosis is present.    CT ABDOMEN AND PELVIS FINDINGS  Hepatic metastatic lesions are observed in segments 4B, 5, 6, 7, and 8 are in generally reduced in size compared to prior PET-CT. For example, index lesion in segment 8 measures 2 1 x 1.7, formerly 2.4 x 1.9 cm.  1.1 x 1.3 cm left adrenal  mass has a precontrast density prior PET-CT highly favoring adenoma  Pancreas, right adrenal gland, and gallbladder unremarkable aortoiliac atherosclerosis is present. Several tiny renal cysts are present.  No pathologic upper abdominal adenopathy is observed. No pathologic pelvic adenopathy is observed. Chronic calcified uterine fibroids noted.  On prior PET-CT, there was a hypermetabolic lesion the left iliac bone. The corresponding lucent on today's CT Measures 1.1 x 1.0 cm, slightly larger than on the CT data the prior PET-CT or measure the lucency at 0.9  x 1.0 cm.    IMPRESSION: 1. Generalized reduction metastatic lesions in the chest and liver. 2. The left iliac bone lesion measures slightly larger than on the CT data from the prior PET-CT, although given the trend in the other lesions I suspect this is a reflection of response to treatment rather than true enlargement of the underlying lesion.   Electronically Signed   By: Herbie Baltimore M.D.   On: 08/27/2013 08:34   ASSESSMENT AND PLAN: This is a very pleasant 67 years old white female recently diagnosed with neuroendocrine carcinoma, small cell with multiple liver metastases as well as left AP window lymphadenopathy, extrapleural nodal metastasis and left iliac bone lesion. The patient has no evidence for metastatic disease to the brain.  She has a history of neuroendocrine carcinoma, large cell type diagnosed as stage IIA in February of 2014 status post left upper lobectomy followed by 4 cycles of adjuvant chemotherapy with cisplatin and Alimta. She's currently being treated with systemic chemotherapy in the form of carboplatin for an AUC of 5 given on day 1 and etoposide at 120 mg router squared on days 1, 2 and 3 with Neulasta support given on day 4 every 3 weeks, status post 3 cycles. Patient was discussed with also seen by Dr. Arbutus Ped. Her platelet count is 94,000. We will dose reduce cycle 4 likely going forward with the carboplatin dose to an  AUC of 5 given on day 1 and etoposide at 100 mg per meter square given on days 1, 2 and 3 with Neulasta support given on day 4 every 3 weeks. She'll proceed with cycle #4 today with the reduced doses of carboplatin and etoposide as outlined. She will continue with weekly labs as scheduled. She'll followup with Dr. Arbutus Ped in 3 weeks with a restaging CT scan of the chest, abdomen and pelvis with contrast to reevaluate her disease. She was advised to call immediately if she has any concerning symptoms in the interval.  The patient voices understanding of current disease status and treatment options and is in agreement with the current care plan.  All questions were answered. The patient knows to call the clinic with any problems, questions or concerns. We can certainly see the patient much sooner if necessary.    Conni Slipper, PA-C 09/20/2013  ADDENDUM: Hematology/Oncology Attending: I had a face to face encounter with the patient. I recommended her care plan. The patient is a very pleasant 67 years old white female with neuroendocrine carcinoma, small cell lung cancer. She is currently undergoing systemic chemotherapy with carboplatin and etoposide status post 3 cycles. Her platelets count of 94,000 today. The patient will continue with cycle #4 of her chemotherapy today as scheduled but I would reduce the dose of carboplatin to AUC of 4 and etoposide 2 100 mg/M2. She would come back for follow up visit in 3 weeks with repeat CT scan of the chest, abdomen and pelvis for restaging of her disease. She was advised to call immediately if she has any concerning symptoms in the interval. Lajuana Matte., MD. 09/21/2013

## 2013-09-21 ENCOUNTER — Ambulatory Visit (HOSPITAL_BASED_OUTPATIENT_CLINIC_OR_DEPARTMENT_OTHER): Payer: Medicare Other

## 2013-09-21 VITALS — BP 116/54 | HR 66 | Temp 98.3°F

## 2013-09-21 DIAGNOSIS — C7B8 Other secondary neuroendocrine tumors: Secondary | ICD-10-CM

## 2013-09-21 DIAGNOSIS — C7A1 Malignant poorly differentiated neuroendocrine tumors: Secondary | ICD-10-CM

## 2013-09-21 DIAGNOSIS — C3492 Malignant neoplasm of unspecified part of left bronchus or lung: Secondary | ICD-10-CM

## 2013-09-21 DIAGNOSIS — Z5111 Encounter for antineoplastic chemotherapy: Secondary | ICD-10-CM

## 2013-09-21 DIAGNOSIS — C787 Secondary malignant neoplasm of liver and intrahepatic bile duct: Secondary | ICD-10-CM

## 2013-09-21 MED ORDER — DEXAMETHASONE SODIUM PHOSPHATE 10 MG/ML IJ SOLN
10.0000 mg | Freq: Once | INTRAMUSCULAR | Status: AC
  Administered 2013-09-21: 10 mg via INTRAVENOUS

## 2013-09-21 MED ORDER — ONDANSETRON 8 MG/NS 50 ML IVPB
INTRAVENOUS | Status: AC
  Filled 2013-09-21: qty 8

## 2013-09-21 MED ORDER — SODIUM CHLORIDE 0.9 % IJ SOLN
10.0000 mL | INTRAMUSCULAR | Status: DC | PRN
  Administered 2013-09-21: 10 mL
  Filled 2013-09-21: qty 10

## 2013-09-21 MED ORDER — SODIUM CHLORIDE 0.9 % IV SOLN
100.0000 mg/m2 | Freq: Once | INTRAVENOUS | Status: AC
  Administered 2013-09-21: 160 mg via INTRAVENOUS
  Filled 2013-09-21: qty 8

## 2013-09-21 MED ORDER — DEXAMETHASONE SODIUM PHOSPHATE 10 MG/ML IJ SOLN
INTRAMUSCULAR | Status: AC
  Filled 2013-09-21: qty 1

## 2013-09-21 MED ORDER — ONDANSETRON 8 MG/50ML IVPB (CHCC)
8.0000 mg | Freq: Once | INTRAVENOUS | Status: AC
  Administered 2013-09-21: 8 mg via INTRAVENOUS

## 2013-09-21 MED ORDER — SODIUM CHLORIDE 0.9 % IV SOLN
Freq: Once | INTRAVENOUS | Status: AC
  Administered 2013-09-21: 11:00:00 via INTRAVENOUS

## 2013-09-21 MED ORDER — HEPARIN SOD (PORK) LOCK FLUSH 100 UNIT/ML IV SOLN
500.0000 [IU] | Freq: Once | INTRAVENOUS | Status: AC | PRN
  Administered 2013-09-21: 500 [IU]
  Filled 2013-09-21: qty 5

## 2013-09-21 NOTE — Patient Instructions (Signed)
[facility] Discharge Instructions for Patients Receiving Chemotherapy  Today you received the following chemotherapy agents: Etoposide.  To help prevent nausea and vomiting after your treatment, we encourage you to take your nausea medication as prescribed.   If you develop nausea and vomiting that is not controlled by your nausea medication, call the clinic.   BELOW ARE SYMPTOMS THAT SHOULD BE REPORTED IMMEDIATELY:  *FEVER GREATER THAN 100.5 F  *CHILLS WITH OR WITHOUT FEVER  NAUSEA AND VOMITING THAT IS NOT CONTROLLED WITH YOUR NAUSEA MEDICATION  *UNUSUAL SHORTNESS OF BREATH  *UNUSUAL BRUISING OR BLEEDING  TENDERNESS IN MOUTH AND THROAT WITH OR WITHOUT PRESENCE OF ULCERS  *URINARY PROBLEMS  *BOWEL PROBLEMS  UNUSUAL RASH Items with * indicate a potential emergency and should be followed up as soon as possible.  Feel free to call the clinic you have any questions or concerns. The clinic phone number is [phone].    

## 2013-09-22 ENCOUNTER — Ambulatory Visit (HOSPITAL_BASED_OUTPATIENT_CLINIC_OR_DEPARTMENT_OTHER): Payer: Medicare Other

## 2013-09-22 VITALS — BP 114/93 | HR 90 | Temp 97.7°F | Resp 18

## 2013-09-22 DIAGNOSIS — C3492 Malignant neoplasm of unspecified part of left bronchus or lung: Secondary | ICD-10-CM

## 2013-09-22 DIAGNOSIS — C7A1 Malignant poorly differentiated neuroendocrine tumors: Secondary | ICD-10-CM

## 2013-09-22 DIAGNOSIS — C787 Secondary malignant neoplasm of liver and intrahepatic bile duct: Secondary | ICD-10-CM

## 2013-09-22 DIAGNOSIS — C7B8 Other secondary neuroendocrine tumors: Secondary | ICD-10-CM

## 2013-09-22 DIAGNOSIS — Z5111 Encounter for antineoplastic chemotherapy: Secondary | ICD-10-CM

## 2013-09-22 MED ORDER — ONDANSETRON 8 MG/NS 50 ML IVPB
INTRAVENOUS | Status: AC
  Filled 2013-09-22: qty 8

## 2013-09-22 MED ORDER — SODIUM CHLORIDE 0.9 % IV SOLN
Freq: Once | INTRAVENOUS | Status: AC
  Administered 2013-09-22: 11:00:00 via INTRAVENOUS

## 2013-09-22 MED ORDER — DEXAMETHASONE SODIUM PHOSPHATE 10 MG/ML IJ SOLN
INTRAMUSCULAR | Status: AC
  Filled 2013-09-22: qty 1

## 2013-09-22 MED ORDER — HEPARIN SOD (PORK) LOCK FLUSH 100 UNIT/ML IV SOLN
500.0000 [IU] | Freq: Once | INTRAVENOUS | Status: AC | PRN
  Administered 2013-09-22: 500 [IU]
  Filled 2013-09-22: qty 5

## 2013-09-22 MED ORDER — SODIUM CHLORIDE 0.9 % IV SOLN
100.0000 mg/m2 | Freq: Once | INTRAVENOUS | Status: AC
  Administered 2013-09-22: 160 mg via INTRAVENOUS
  Filled 2013-09-22: qty 8

## 2013-09-22 MED ORDER — SODIUM CHLORIDE 0.9 % IJ SOLN
10.0000 mL | INTRAMUSCULAR | Status: DC | PRN
  Administered 2013-09-22: 10 mL
  Filled 2013-09-22: qty 10

## 2013-09-22 MED ORDER — DEXAMETHASONE SODIUM PHOSPHATE 10 MG/ML IJ SOLN
10.0000 mg | Freq: Once | INTRAMUSCULAR | Status: AC
  Administered 2013-09-22: 10 mg via INTRAVENOUS

## 2013-09-22 MED ORDER — ONDANSETRON 8 MG/50ML IVPB (CHCC)
8.0000 mg | Freq: Once | INTRAVENOUS | Status: AC
  Administered 2013-09-22: 8 mg via INTRAVENOUS

## 2013-09-22 NOTE — Patient Instructions (Signed)
[facility] Discharge Instructions for Patients Receiving Chemotherapy  Today you received the following chemotherapy agents: Etoposide.  To help prevent nausea and vomiting after your treatment, we encourage you to take your nausea medication as prescribed.   If you develop nausea and vomiting that is not controlled by your nausea medication, call the clinic.   BELOW ARE SYMPTOMS THAT SHOULD BE REPORTED IMMEDIATELY:  *FEVER GREATER THAN 100.5 F  *CHILLS WITH OR WITHOUT FEVER  NAUSEA AND VOMITING THAT IS NOT CONTROLLED WITH YOUR NAUSEA MEDICATION  *UNUSUAL SHORTNESS OF BREATH  *UNUSUAL BRUISING OR BLEEDING  TENDERNESS IN MOUTH AND THROAT WITH OR WITHOUT PRESENCE OF ULCERS  *URINARY PROBLEMS  *BOWEL PROBLEMS  UNUSUAL RASH Items with * indicate a potential emergency and should be followed up as soon as possible.  Feel free to call the clinic you have any questions or concerns. The clinic phone number is [phone].    

## 2013-09-23 ENCOUNTER — Ambulatory Visit (HOSPITAL_BASED_OUTPATIENT_CLINIC_OR_DEPARTMENT_OTHER): Payer: Medicare Other

## 2013-09-23 VITALS — BP 137/53 | Temp 97.1°F

## 2013-09-23 DIAGNOSIS — C3492 Malignant neoplasm of unspecified part of left bronchus or lung: Secondary | ICD-10-CM

## 2013-09-23 DIAGNOSIS — C7B8 Other secondary neuroendocrine tumors: Secondary | ICD-10-CM

## 2013-09-23 DIAGNOSIS — C7A1 Malignant poorly differentiated neuroendocrine tumors: Secondary | ICD-10-CM

## 2013-09-23 DIAGNOSIS — C787 Secondary malignant neoplasm of liver and intrahepatic bile duct: Secondary | ICD-10-CM

## 2013-09-23 MED ORDER — PEGFILGRASTIM INJECTION 6 MG/0.6ML
6.0000 mg | Freq: Once | SUBCUTANEOUS | Status: AC
  Administered 2013-09-23: 6 mg via SUBCUTANEOUS
  Filled 2013-09-23: qty 0.6

## 2013-09-23 NOTE — Patient Instructions (Signed)

## 2013-09-27 ENCOUNTER — Other Ambulatory Visit (HOSPITAL_BASED_OUTPATIENT_CLINIC_OR_DEPARTMENT_OTHER): Payer: Medicare Other

## 2013-09-27 DIAGNOSIS — C7A1 Malignant poorly differentiated neuroendocrine tumors: Secondary | ICD-10-CM

## 2013-09-27 DIAGNOSIS — C3492 Malignant neoplasm of unspecified part of left bronchus or lung: Secondary | ICD-10-CM

## 2013-09-27 LAB — COMPREHENSIVE METABOLIC PANEL (CC13)
AST: 13 U/L (ref 5–34)
Albumin: 3.4 g/dL — ABNORMAL LOW (ref 3.5–5.0)
Alkaline Phosphatase: 116 U/L (ref 40–150)
BUN: 18.7 mg/dL (ref 7.0–26.0)
Calcium: 9.7 mg/dL (ref 8.4–10.4)
Chloride: 103 mEq/L (ref 98–109)
Glucose: 89 mg/dl (ref 70–140)
Potassium: 4.1 mEq/L (ref 3.5–5.1)
Sodium: 132 mEq/L — ABNORMAL LOW (ref 136–145)
Total Protein: 6.7 g/dL (ref 6.4–8.3)

## 2013-09-27 LAB — CBC WITH DIFFERENTIAL/PLATELET
Basophils Absolute: 0.1 10*3/uL (ref 0.0–0.1)
Eosinophils Absolute: 0 10*3/uL (ref 0.0–0.5)
HCT: 25.9 % — ABNORMAL LOW (ref 34.8–46.6)
HGB: 9 g/dL — ABNORMAL LOW (ref 11.6–15.9)
MCV: 91.7 fL (ref 79.5–101.0)
NEUT#: 5.5 10*3/uL (ref 1.5–6.5)
RBC: 2.82 10*6/uL — ABNORMAL LOW (ref 3.70–5.45)
RDW: 16.6 % — ABNORMAL HIGH (ref 11.2–14.5)
WBC: 7.4 10*3/uL (ref 3.9–10.3)
lymph#: 1.4 10*3/uL (ref 0.9–3.3)

## 2013-10-04 ENCOUNTER — Other Ambulatory Visit (HOSPITAL_BASED_OUTPATIENT_CLINIC_OR_DEPARTMENT_OTHER): Payer: Medicare Other

## 2013-10-04 DIAGNOSIS — C3490 Malignant neoplasm of unspecified part of unspecified bronchus or lung: Secondary | ICD-10-CM

## 2013-10-04 DIAGNOSIS — C3492 Malignant neoplasm of unspecified part of left bronchus or lung: Secondary | ICD-10-CM

## 2013-10-04 LAB — CBC WITH DIFFERENTIAL/PLATELET
BASO%: 0.8 % (ref 0.0–2.0)
Basophils Absolute: 0.1 10e3/uL (ref 0.0–0.1)
EOS%: 0.7 % (ref 0.0–7.0)
Eosinophils Absolute: 0.1 10e3/uL (ref 0.0–0.5)
HCT: 23.8 % — ABNORMAL LOW (ref 34.8–46.6)
HGB: 8.1 g/dL — ABNORMAL LOW (ref 11.6–15.9)
LYMPH%: 22 % (ref 14.0–49.7)
MCH: 31.6 pg (ref 25.1–34.0)
MCHC: 34.1 g/dL (ref 31.5–36.0)
MCV: 92.6 fL (ref 79.5–101.0)
MONO#: 0.7 10e3/uL (ref 0.1–0.9)
MONO%: 8 % (ref 0.0–14.0)
NEUT#: 6.2 10e3/uL (ref 1.5–6.5)
NEUT%: 68.5 % (ref 38.4–76.8)
Platelets: 38 10e3/uL — ABNORMAL LOW (ref 145–400)
RBC: 2.57 10e6/uL — ABNORMAL LOW (ref 3.70–5.45)
RDW: 17.5 % — ABNORMAL HIGH (ref 11.2–14.5)
WBC: 9.1 10e3/uL (ref 3.9–10.3)
lymph#: 2 10e3/uL (ref 0.9–3.3)

## 2013-10-04 LAB — COMPREHENSIVE METABOLIC PANEL (CC13)
AST: 12 U/L (ref 5–34)
Albumin: 3.2 g/dL — ABNORMAL LOW (ref 3.5–5.0)
Alkaline Phosphatase: 98 U/L (ref 40–150)
Anion Gap: 7 mEq/L (ref 3–11)
BUN: 16.6 mg/dL (ref 7.0–26.0)
CO2: 23 mEq/L (ref 22–29)
Calcium: 9.3 mg/dL (ref 8.4–10.4)
Chloride: 103 mEq/L (ref 98–109)
Creatinine: 0.9 mg/dL (ref 0.6–1.1)
Glucose: 105 mg/dl (ref 70–140)
Potassium: 4.1 mEq/L (ref 3.5–5.1)

## 2013-10-05 ENCOUNTER — Ambulatory Visit: Payer: Medicare Other | Admitting: Thoracic Surgery (Cardiothoracic Vascular Surgery)

## 2013-10-08 ENCOUNTER — Encounter (HOSPITAL_COMMUNITY): Payer: Self-pay

## 2013-10-08 ENCOUNTER — Ambulatory Visit (HOSPITAL_COMMUNITY)
Admission: RE | Admit: 2013-10-08 | Discharge: 2013-10-08 | Disposition: A | Discharge status: Home / Self Care | Payer: Medicare Other | Source: Ambulatory Visit | Attending: Physician Assistant | Admitting: Physician Assistant

## 2013-10-08 ENCOUNTER — Other Ambulatory Visit (HOSPITAL_COMMUNITY): Payer: Medicare Other

## 2013-10-08 DIAGNOSIS — M47814 Spondylosis without myelopathy or radiculopathy, thoracic region: Secondary | ICD-10-CM | POA: Insufficient documentation

## 2013-10-08 DIAGNOSIS — I708 Atherosclerosis of other arteries: Secondary | ICD-10-CM | POA: Insufficient documentation

## 2013-10-08 DIAGNOSIS — J90 Pleural effusion, not elsewhere classified: Secondary | ICD-10-CM | POA: Insufficient documentation

## 2013-10-08 DIAGNOSIS — C787 Secondary malignant neoplasm of liver and intrahepatic bile duct: Secondary | ICD-10-CM | POA: Insufficient documentation

## 2013-10-08 DIAGNOSIS — C3492 Malignant neoplasm of unspecified part of left bronchus or lung: Secondary | ICD-10-CM

## 2013-10-08 DIAGNOSIS — C3490 Malignant neoplasm of unspecified part of unspecified bronchus or lung: Secondary | ICD-10-CM | POA: Insufficient documentation

## 2013-10-08 DIAGNOSIS — E278 Other specified disorders of adrenal gland: Secondary | ICD-10-CM | POA: Insufficient documentation

## 2013-10-08 DIAGNOSIS — R918 Other nonspecific abnormal finding of lung field: Secondary | ICD-10-CM | POA: Insufficient documentation

## 2013-10-08 DIAGNOSIS — I2510 Atherosclerotic heart disease of native coronary artery without angina pectoris: Secondary | ICD-10-CM | POA: Insufficient documentation

## 2013-10-08 DIAGNOSIS — M948X9 Other specified disorders of cartilage, unspecified sites: Secondary | ICD-10-CM | POA: Insufficient documentation

## 2013-10-08 MED ORDER — IOHEXOL 300 MG/ML  SOLN
100.0000 mL | Freq: Once | INTRAMUSCULAR | Status: AC | PRN
  Administered 2013-10-08: 100 mL via INTRAVENOUS

## 2013-10-11 ENCOUNTER — Ambulatory Visit: Payer: Medicare Other | Admitting: Internal Medicine

## 2013-10-11 ENCOUNTER — Other Ambulatory Visit: Payer: Medicare Other | Admitting: Lab

## 2013-10-11 ENCOUNTER — Ambulatory Visit (HOSPITAL_BASED_OUTPATIENT_CLINIC_OR_DEPARTMENT_OTHER): Payer: Medicare Other

## 2013-10-11 ENCOUNTER — Ambulatory Visit (HOSPITAL_BASED_OUTPATIENT_CLINIC_OR_DEPARTMENT_OTHER): Payer: Medicare Other | Admitting: Internal Medicine

## 2013-10-11 ENCOUNTER — Encounter (HOSPITAL_COMMUNITY)
Admission: RE | Admit: 2013-10-11 | Discharge: 2013-10-11 | Disposition: A | Discharge status: Home / Self Care | Payer: Medicare Other | Source: Ambulatory Visit | Attending: Internal Medicine | Admitting: Internal Medicine

## 2013-10-11 ENCOUNTER — Other Ambulatory Visit: Payer: Self-pay | Admitting: *Deleted

## 2013-10-11 ENCOUNTER — Ambulatory Visit: Payer: Medicare Other

## 2013-10-11 ENCOUNTER — Encounter: Payer: Self-pay | Admitting: Internal Medicine

## 2013-10-11 VITALS — BP 116/68 | HR 75 | Temp 99.5°F | Resp 18 | Ht 67.0 in | Wt 129.8 lb

## 2013-10-11 VITALS — BP 137/69 | HR 66 | Temp 97.4°F | Resp 16

## 2013-10-11 DIAGNOSIS — D6481 Anemia due to antineoplastic chemotherapy: Secondary | ICD-10-CM

## 2013-10-11 DIAGNOSIS — C7B8 Other secondary neuroendocrine tumors: Secondary | ICD-10-CM

## 2013-10-11 DIAGNOSIS — D649 Anemia, unspecified: Secondary | ICD-10-CM

## 2013-10-11 DIAGNOSIS — C7A1 Malignant poorly differentiated neuroendocrine tumors: Secondary | ICD-10-CM

## 2013-10-11 DIAGNOSIS — C3492 Malignant neoplasm of unspecified part of left bronchus or lung: Secondary | ICD-10-CM

## 2013-10-11 DIAGNOSIS — C787 Secondary malignant neoplasm of liver and intrahepatic bile duct: Secondary | ICD-10-CM

## 2013-10-11 DIAGNOSIS — C3491 Malignant neoplasm of unspecified part of right bronchus or lung: Secondary | ICD-10-CM

## 2013-10-11 LAB — COMPREHENSIVE METABOLIC PANEL (CC13)
AST: 13 U/L (ref 5–34)
Albumin: 3.1 g/dL — ABNORMAL LOW (ref 3.5–5.0)
Anion Gap: 8 mEq/L (ref 3–11)
BUN: 10.7 mg/dL (ref 7.0–26.0)
CO2: 22 mEq/L (ref 22–29)
Calcium: 9.3 mg/dL (ref 8.4–10.4)
Chloride: 107 mEq/L (ref 98–109)
Creatinine: 0.9 mg/dL (ref 0.6–1.1)
Glucose: 108 mg/dl (ref 70–140)
Potassium: 4 mEq/L (ref 3.5–5.1)
Sodium: 137 mEq/L (ref 136–145)

## 2013-10-11 LAB — CBC WITH DIFFERENTIAL/PLATELET
BASO%: 0.3 % (ref 0.0–2.0)
Basophils Absolute: 0 10*3/uL (ref 0.0–0.1)
Eosinophils Absolute: 0 10*3/uL (ref 0.0–0.5)
HCT: 22.6 % — ABNORMAL LOW (ref 34.8–46.6)
MCV: 94.6 fL (ref 79.5–101.0)
MONO%: 8.7 % (ref 0.0–14.0)
NEUT%: 73 % (ref 38.4–76.8)
Platelets: 108 10*3/uL — ABNORMAL LOW (ref 145–400)
RDW: 19.9 % — ABNORMAL HIGH (ref 11.2–14.5)
WBC: 9.7 10*3/uL (ref 3.9–10.3)
lymph#: 1.7 10*3/uL (ref 0.9–3.3)

## 2013-10-11 MED ORDER — SODIUM CHLORIDE 0.9 % IV SOLN
250.0000 mL | Freq: Once | INTRAVENOUS | Status: AC
  Administered 2013-10-11: 250 mL via INTRAVENOUS

## 2013-10-11 MED ORDER — ACETAMINOPHEN 325 MG PO TABS
650.0000 mg | ORAL_TABLET | Freq: Once | ORAL | Status: AC
  Administered 2013-10-11: 650 mg via ORAL

## 2013-10-11 MED ORDER — SODIUM CHLORIDE 0.9 % IJ SOLN
10.0000 mL | INTRAMUSCULAR | Status: AC | PRN
  Administered 2013-10-11: 10 mL
  Filled 2013-10-11: qty 10

## 2013-10-11 MED ORDER — HEPARIN SOD (PORK) LOCK FLUSH 100 UNIT/ML IV SOLN
500.0000 [IU] | Freq: Every day | INTRAVENOUS | Status: AC | PRN
Start: 2013-10-11 — End: 2013-10-11
  Administered 2013-10-11: 500 [IU]
  Filled 2013-10-11: qty 5

## 2013-10-11 MED ORDER — DIPHENHYDRAMINE HCL 25 MG PO CAPS
25.0000 mg | ORAL_CAPSULE | Freq: Once | ORAL | Status: AC
  Administered 2013-10-11: 25 mg via ORAL

## 2013-10-11 MED ORDER — ACETAMINOPHEN 325 MG PO TABS
ORAL_TABLET | ORAL | Status: AC
  Filled 2013-10-11: qty 2

## 2013-10-11 MED ORDER — DIPHENHYDRAMINE HCL 25 MG PO CAPS
ORAL_CAPSULE | ORAL | Status: AC
  Filled 2013-10-11: qty 1

## 2013-10-11 NOTE — Patient Instructions (Signed)
Blood Transfusion Information WHAT IS A BLOOD TRANSFUSION? A transfusion is the replacement of blood or some of its parts. Blood is made up of multiple cells which provide different functions.  Red blood cells carry oxygen and are used for blood loss replacement.  White blood cells fight against infection.  Platelets control bleeding.  Plasma helps clot blood.  Other blood products are available for specialized needs, such as hemophilia or other clotting disorders. BEFORE THE TRANSFUSION  Who gives blood for transfusions?   You may be able to donate blood to be used at a later date on yourself (autologous donation).  Relatives can be asked to donate blood. This is generally not any safer than if you have received blood from a stranger. The same precautions are taken to ensure safety when a relative's blood is donated.  Healthy volunteers who are fully evaluated to make sure their blood is safe. This is blood bank blood. Transfusion therapy is the safest it has ever been in the practice of medicine. Before blood is taken from a donor, a complete history is taken to make sure that person has no history of diseases nor engages in risky social behavior (examples are intravenous drug use or sexual activity with multiple partners). The donor's travel history is screened to minimize risk of transmitting infections, such as malaria. The donated blood is tested for signs of infectious diseases, such as HIV and hepatitis. The blood is then tested to be sure it is compatible with you in order to minimize the chance of a transfusion reaction. If you or a relative donates blood, this is often done in anticipation of surgery and is not appropriate for emergency situations. It takes many days to process the donated blood. RISKS AND COMPLICATIONS Although transfusion therapy is very safe and saves many lives, the main dangers of transfusion include:   Getting an infectious disease.  Developing a  transfusion reaction. This is an allergic reaction to something in the blood you were given. Every precaution is taken to prevent this. The decision to have a blood transfusion has been considered carefully by your caregiver before blood is given. Blood is not given unless the benefits outweigh the risks. AFTER THE TRANSFUSION  Right after receiving a blood transfusion, you will usually feel much better and more energetic. This is especially true if your red blood cells have gotten low (anemic). The transfusion raises the level of the red blood cells which carry oxygen, and this usually causes an energy increase.  The nurse administering the transfusion will monitor you carefully for complications. HOME CARE INSTRUCTIONS  No special instructions are needed after a transfusion. You may find your energy is better. Speak with your caregiver about any limitations on activity for underlying diseases you may have. SEEK MEDICAL CARE IF:   Your condition is not improving after your transfusion.  You develop redness or irritation at the intravenous (IV) site. SEEK IMMEDIATE MEDICAL CARE IF:  Any of the following symptoms occur over the next 12 hours:  Shaking chills.  You have a temperature by mouth above 102 F (38.9 C), not controlled by medicine.  Chest, back, or muscle pain.  People around you feel you are not acting correctly or are confused.  Shortness of breath or difficulty breathing.  Dizziness and fainting.  You get a rash or develop hives.  You have a decrease in urine output.  Your urine turns a dark color or changes to pink, red, or brown. Any of the following   symptoms occur over the next 10 days:  You have a temperature by mouth above 102 F (38.9 C), not controlled by medicine.  Shortness of breath.  Weakness after normal activity.  The white part of the eye turns yellow (jaundice).  You have a decrease in the amount of urine or are urinating less often.  Your  urine turns a dark color or changes to pink, red, or brown. Document Released: 09/27/2000 Document Revised: 12/23/2011 Document Reviewed: 05/16/2008 [employer] Patient Information 2014 [employer].  

## 2013-10-11 NOTE — Patient Instructions (Signed)
PRBCs transfusion today and tomorrow. Followup visit in 2 weeks.

## 2013-10-11 NOTE — Progress Notes (Signed)
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   PRIOR THERAPY:  1) Status post left upper lobectomy with lymph node dissection.  2) Adjuvant systemic chemotherapy with cisplatin 75 mg region squared and Alimta 500 mg per meter squared given every 3 weeks for total of 4 cycles, status post 4 cycles. Last dose was given 03/02/2013.  3)  Systemic chemotherapy with carboplatin for AUC of 5 on day 1 and etoposide at 120 mg/M2 on days 1, 2 and 3 with Neulasta support on day 4 every 3 weeks. First dose of chemotherapy on 07/19/2013. Status post 4 cycles. Last cycle was on 09/20/2013 discontinued secondary to disease progression.   CURRENT THERAPY: None.  CHEMOTHERAPY INTENT: Palliative  CURRENT # OF CHEMOTHERAPY CYCLES: 0  CURRENT ANTIEMETICS: Zofran, dexamethasone and Compazine.  CURRENT SMOKING STATUS: Former smoker  ORAL CHEMOTHERAPY AND CONSENT: None  CURRENT BISPHOSPHONATES USE: None  PAIN MANAGEMENT: 0/10  NARCOTICS INDUCED CONSTIPATION: None  LIVING WILL AND CODE STATUS: ?    INTERVAL HISTORY: Sara Mcclure 67 y.o. female returns to the clinic today for followup visit accompanied by her husband. She tolerated the last cycle of her chemotherapy with carboplatin and etoposide relatively well with the exception of increasing fatigue secondary to chemotherapy-induced anemia.  She denied having any significant chest pain, but has shortness of breath with exertion, no cough or hemoptysis.  She has no weight loss or night sweats. The  patient denied having any nausea or vomiting. She has no fever or chills. She had repeat CT scan of the chest, abdomen and pelvis performed recently and she is here for evaluation and discussion of her scan results.  MEDICAL HISTORY: Past Medical History  Diagnosis Date  . GERD (gastroesophageal reflux disease)   . Asthma   . Hypertension   . Diabetes mellitus   . Thyroid disease   . Anxiety   . Vertigo   . Breast cancer   . COPD (chronic obstructive pulmonary disease)   . PONV (postoperative nausea and vomiting)     ALLERGIES:  is allergic to metformin and related.  MEDICATIONS:  Current Outpatient Prescriptions  Medication Sig Dispense Refill  . albuterol (ACCUNEB) 1.25 MG/3ML nebulizer solution Take 1 ampule by nebulization 2 (two) times daily as needed. For shortness of breath      . albuterol (PROVENTIL HFA;VENTOLIN HFA) 108 (90 BASE) MCG/ACT inhaler Inhale 2 puffs into the lungs every 6 (six) hours as needed. For shortness of breath      . ALPRAZolam (XANAX) 0.25 MG tablet Take 0.25 mg by mouth 3 (three) times daily as needed for anxiety.       . Alum Hydroxide-Mag Carbonate (GAVISCON PO) Take 1 tablet by mouth daily as needed (stomach).      Marland Kitchen aspirin EC 81 MG tablet Take 81 mg by mouth every morning.       . cetirizine (ZYRTEC) 10 MG tablet Take 10 mg by mouth daily as needed for allergies.      . Cholecalciferol (VITAMIN D) 2000 UNITS tablet Take 2,000 Units by mouth daily with lunch.       Marland Kitchen  dextromethorphan-guaiFENesin (MUCINEX DM) 30-600 MG per 12 hr tablet Take 1 tablet by mouth every 12 (twelve) hours.      . Fluticasone-Salmeterol (ADVAIR) 250-50 MCG/DOSE AEPB Inhale 1 puff into the lungs every 12 (twelve) hours.      . fosinopril (MONOPRIL) 20 MG tablet Take 20 mg by mouth daily after breakfast.       . ibuprofen (ADVIL,MOTRIN) 200 MG tablet Take 200 mg by mouth every 8 (eight) hours as needed for pain.      Marland Kitchen levothyroxine (SYNTHROID, LEVOTHROID) 50 MCG tablet Take  50 mcg by mouth daily before breakfast.       . lidocaine-prilocaine (EMLA) cream Apply topically as needed. Apply to port 1 hour before chemotherapy.  30 g  0  . metoprolol tartrate (LOPRESSOR) 25 MG tablet Take 1 tablet (25 mg total) by mouth 2 (two) times daily.  60 tablet  1  . montelukast (SINGULAIR) 10 MG tablet Take 10 mg by mouth at bedtime.      . ondansetron (ZOFRAN-ODT) 8 MG disintegrating tablet Take 8 mg by mouth every 8 (eight) hours as needed for nausea.       . Polyvinyl Alcohol-Povidone (REFRESH OP) Place 2 drops into both eyes daily as needed (For watery eyes.).      Marland Kitchen PRESCRIPTION MEDICATION She receives her treatments at the [facility] with Dr. Arbutus Ped. She received a 3 day cycle from 07/19/13 to 07/21/13 of Etoposide 200mg  and then an injection of Neulasta 6mg  on 07/22/13.      Marland Kitchen prochlorperazine (COMPAZINE) 10 MG tablet Take 10 mg by mouth every 6 (six) hours as needed (For nausea.).       No current facility-administered medications for this visit.    SURGICAL HISTORY:  Past Surgical History  Procedure Laterality Date  . Cardiac catherization    . Breast lumpectomy    . Tubal ligation    . Video assisted thoracoscopy (vats)/wedge resection Left 11/19/2012    Procedure: VIDEO ASSISTED THORACOSCOPY (VATS)/WEDGE RESECTION;  Surgeon: Loreli Slot, MD;  Location[facility] OR;  Service: Thoracic;  Laterality: Left;  left upper lobe wedge resection  . Lobectomy Left 11/19/2012    Procedure: LOBECTOMY;  Surgeon: Loreli Slot, MD;  Location: [facility] OR;  Service: Thoracic;  Laterality: Left;  left upper lobe  . Lymph node dissection Left 11/19/2012    Procedure: LYMPH NODE DISSECTION;  Surgeon: Loreli Slot, MD;  Location: [facility] OR;  Service: Thoracic;  Laterality: Left;    REVIEW OF SYSTEMS:  Constitutional: negative Eyes: negative Ears, nose, mouth, throat, and face: negative Respiratory: positive for dyspnea on exertion Cardiovascular:  negative Gastrointestinal: negative Genitourinary:negative Integument/breast: negative Hematologic/lymphatic: negative Musculoskeletal:negative Neurological: negative Behavioral/Psych: negative Endocrine: negative Allergic/Immunologic: negative   PHYSICAL EXAMINATION: General appearance: alert, cooperative and no distress Head: Normocephalic, without obvious abnormality, atraumatic Neck: no adenopathy, no JVD, supple, symmetrical, trachea midline and thyroid not enlarged, symmetric, no tenderness/mass/nodules Lymph nodes: Cervical, supraclavicular, and axillary nodes normal. Resp: clear to auscultation bilaterally Back: symmetric, no curvature. ROM normal. No CVA tenderness. Cardio: regular rate and rhythm, S1, S2 normal, no murmur, click, rub or gallop GI: soft, non-tender; bowel sounds normal; no masses,  no organomegaly Extremities: extremities normal, atraumatic, no cyanosis or edema Neurologic: Alert and oriented X 3, normal strength and tone. Normal symmetric reflexes. Normal coordination and gait   ECOG PERFORMANCE STATUS: 1 - Symptomatic but completely ambulatory  Blood pressure 116/68, pulse 75, temperature 99.5 F (37.5 C), temperature source  Oral, resp. rate 18, height 5\' 7"  (1.702 m), weight 129 lb 12.8 oz (58.877 kg).  LABORATORY DATA: Lab Results  Component Value Date   WBC 9.7 10/11/2013   HGB 7.3* 10/11/2013   HCT 22.6* 10/11/2013   MCV 94.6 10/11/2013   PLT 108* 10/11/2013      Chemistry      Component Value Date/Time   NA 133* 10/04/2013 1056   NA 135 08/05/2013 1132   K 4.1 10/04/2013 1056   K 4.0 08/05/2013 1132   CL 101 08/05/2013 1132   CL 102 03/30/2013 0951   CO2 23 10/04/2013 1056   CO2 23 08/05/2013 1132   BUN 16.6 10/04/2013 1056   BUN 13 08/05/2013 1132   CREATININE 0.9 10/04/2013 1056   CREATININE 0.99 08/05/2013 1132      Component Value Date/Time   CALCIUM 9.3 10/04/2013 1056   CALCIUM 10.8* 08/05/2013 1132   ALKPHOS 98 10/04/2013  1056   ALKPHOS 65 11/21/2012 0445   AST 12 10/04/2013 1056   AST 15 11/21/2012 0445   ALT 10 10/04/2013 1056   ALT 16 11/21/2012 0445   BILITOT 0.30 10/04/2013 1056   BILITOT 0.4 11/21/2012 0445       RADIOGRAPHIC STUDIES: Ct Chest W Contrast  10/08/2013   CLINICAL DATA:  Small cell lung cancer.  EXAM: CT CHEST WITH CONTRAST  CT ABDOMEN AND PELVIS WITH AND WITHOUT CONTRAST  TECHNIQUE: Multidetector CT imaging of the chest was performed during intravenous contrast administration. Multidetector CT imaging of the abdomen and pelvis was performed following the standard protocol before and during bolus administration of intravenous contrast.  CONTRAST:  OMNIPAQUE IOHEXOL 300 MG/ML  SOLN  COMPARISON:  CT 08/27/2013.  PET-CT 07/12/2013.  FINDINGS:   CT CHEST FINDINGS  AP window lymph node previously measuring 0.9 by 1.5 cm now measures 0.8 x 15 mm. , image number 23/series 2. AP window lymph node previously measuring 0.9 by 1.1 cm. , now measures 0.4 x 0 0.7 cm, image number 27/series 2. Small pretracheal and subcarinal PET negative nodes remains stable in appearance. Heart size is stable. Coronary artery disease.  Large airways are patent. Persistent atelectasis and small left pleural effusion. Postsurgical changes left lung. No focal pulmonary mass or new nodule. Tiny nodular density in the left lung noted on image number 24/series 4 may represent a vessel on end, but merits follow-up.  Port-A-Cath noted. Postsurgical changes left breast. Degenerative changes thoracic spine.    CT ABDOMEN AND PELVIS FINDINGS  Hepatic metastatic lesions have increased in number and size. Target lesion in the anterior peripheral aspect of the right lobe of the liver previously measuring 2.1 x 1.7 cm now measures 2.5 x 2.2 cm. Adjacent right hepatic lobe lesion previously measuring 1.8 x 1.7 cm now measures 2.6 x 2.3 cm. , image number 58/series 2. Prominent hepatic lesion in the anterior most aspect of the right lobe of  the liver previously measuring 2.0 x 1.8 cm now measures 3 x 2.3 cm, image number 63/series 2. As noted above innumerable new lesions are present. Spleen is normal. Pancreas is normal. No biliary or gallbladder distention.  Previous 1.1 x 1.3 cm left adrenal nodule remains stable. Right adrenal is normal. 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 Kidneys are otherwise normal. No hydronephrosis. No evidence of obstructing ureteral stone. The bladder is nondistended. Calcified uterine fibroids. Adnexa are normal. No free pelvic fluid collections.  No significant adenopathy.  Aortoiliac and visceral vascular atherosclerotic vascular calcification.  No inflammatory change in right or left lower quadrant. There is no bowel distention. Esophagogastric region is normal. No mesenteric masses.  Abdominal wall is intact. No significant hernia. Previously identified left iliac wing lytic lesion is stable.    IMPRESSION: 1. Significant progression of hepatic metastatic disease with increased number and size of hepatic lesions. 2. Previously identified mediastinal lymph nodes have become slightly smaller. 3. Stable left adrenal nodule stable. 4. Stable left iliac wing lytic lesion. 5. Questionable approximate 2-3 mm pulmonary nodule left lung. 6. Postsurgical changes left lung with stable small pleural effusion.   Electronically Signed   By: Maisie Fus  Register   On: 10/08/2013 09:36   ASSESSMENT AND PLAN: This is a very pleasant 67 years old white female recently diagnosed with neuroendocrine carcinoma, small cell with multiple liver metastases as well as left AP window lymphadenopathy, extrapleural nodal metastasis and left iliac bone lesion.  She has a history of neuroendocrine carcinoma, large cell type diagnosed as stage IIA in February of 2014 status post left upper lobectomy followed by 4 cycles of adjuvant chemotherapy with cisplatin and Alimta. She's  currently being treated with systemic chemotherapy in the form of carboplatin for an AUC of 5 given on day 1 and etoposide at 120 mg router squared on days 1, 2 and 3 with Neulasta support given on day 4 every 3 weeks, status post 4 cycles. She tolerated her treatment well except for the chemotherapy-induced anemia with significant fatigue. The recent CT scan of the chest, abdomen and pelvis showed evidence for disease progression especially in the liver. I discussed the scan results with the patient and her husband. I will arrange for the patient to receive 2 units of PRBCs transfusion in the next few days. I will give the patient 2 weeks of treatment to recover from the current adverse effects. I discussed with the patient options for her treatment including consideration of treatment with further chemotherapy versus palliative care and hospice referral. She is still interested in treatment. This will be discussed with them the details in the upcoming visit in 2 weeks. She was advised to call immediately if she has any concerning symptoms in the interval.  The patient voices understanding of current disease status and treatment options and is in agreement with the current care plan.  All questions were answered. The patient knows to call the clinic with any problems, questions or concerns. We can certainly see the patient much sooner if necessary. I spent 15 minutes counseling the patient face to face. The total time spent in the appointment was 25 minutes.   Lajuana Matte., MD 10/11/2013

## 2013-10-12 ENCOUNTER — Ambulatory Visit (HOSPITAL_BASED_OUTPATIENT_CLINIC_OR_DEPARTMENT_OTHER): Payer: Medicare Other

## 2013-10-12 ENCOUNTER — Telehealth: Payer: Self-pay | Admitting: Internal Medicine

## 2013-10-12 VITALS — BP 128/62 | HR 74 | Temp 98.2°F | Resp 18

## 2013-10-12 DIAGNOSIS — D649 Anemia, unspecified: Secondary | ICD-10-CM

## 2013-10-12 MED ORDER — SODIUM CHLORIDE 0.9 % IV SOLN
250.0000 mL | Freq: Once | INTRAVENOUS | Status: AC
  Administered 2013-10-12: 250 mL via INTRAVENOUS

## 2013-10-12 MED ORDER — DIPHENHYDRAMINE HCL 25 MG PO CAPS
ORAL_CAPSULE | ORAL | Status: AC
  Filled 2013-10-12: qty 1

## 2013-10-12 MED ORDER — HEPARIN SOD (PORK) LOCK FLUSH 100 UNIT/ML IV SOLN
500.0000 [IU] | Freq: Every day | INTRAVENOUS | Status: AC | PRN
  Administered 2013-10-12: 500 [IU]
  Filled 2013-10-12: qty 5

## 2013-10-12 MED ORDER — DIPHENHYDRAMINE HCL 25 MG PO CAPS
25.0000 mg | ORAL_CAPSULE | Freq: Once | ORAL | Status: AC
  Administered 2013-10-12: 25 mg via ORAL

## 2013-10-12 MED ORDER — ACETAMINOPHEN 325 MG PO TABS
650.0000 mg | ORAL_TABLET | Freq: Once | ORAL | Status: AC
  Administered 2013-10-12: 650 mg via ORAL

## 2013-10-12 MED ORDER — ACETAMINOPHEN 325 MG PO TABS
ORAL_TABLET | ORAL | Status: AC
  Filled 2013-10-12: qty 2

## 2013-10-12 MED ORDER — SODIUM CHLORIDE 0.9 % IJ SOLN
10.0000 mL | INTRAMUSCULAR | Status: AC | PRN
  Administered 2013-10-12: 10 mL
  Filled 2013-10-12: qty 10

## 2013-10-12 NOTE — Patient Instructions (Signed)
Blood Transfusion  A blood transfusion replaces your blood or some of its parts. Blood is replaced when you have lost blood because of surgery, an accident, or for severe blood conditions like anemia. You can donate blood to be used on yourself if you have a planned surgery. If you lose blood during that surgery, your own blood can be given back to you. Any blood given to you is checked to make sure it matches your blood type. Your temperature, blood pressure, and heart rate (vital signs) will be checked often.  GET HELP RIGHT AWAY IF:   You feel sick to your stomach (nauseous) or throw up (vomit).  You have watery poop (diarrhea).  You have shortness of breath or trouble breathing.  You have blood in your pee (urine) or have dark colored pee.  You have chest pain or tightness.  Your eyes or skin turn yellow (jaundice).  You have a temperature by mouth above 102 F (38.9 C), not controlled by medicine.  You start to shake and have chills.  You develop a a red rash (hives) or feel itchy.  You develop lightheadedness or feel confused.  You develop back, joint, or muscle pain.  You do not feel hungry (lost appetite).  You feel tired, restless, or nervous.  You develop belly (abdominal) cramps. Document Released: 12/27/2008 Document Revised: 12/23/2011 Document Reviewed: 12/27/2008 [employer] Patient Information 2014 [employer].  

## 2013-10-12 NOTE — Telephone Encounter (Signed)
s.w. pt and advised on Jan appt.....pt ok and aware °

## 2013-10-13 ENCOUNTER — Ambulatory Visit: Payer: Medicare Other

## 2013-10-13 LAB — TYPE AND SCREEN: Unit division: 0

## 2013-10-27 ENCOUNTER — Ambulatory Visit (HOSPITAL_BASED_OUTPATIENT_CLINIC_OR_DEPARTMENT_OTHER): Payer: Medicare Other | Admitting: Internal Medicine

## 2013-10-27 ENCOUNTER — Other Ambulatory Visit (HOSPITAL_BASED_OUTPATIENT_CLINIC_OR_DEPARTMENT_OTHER): Payer: Medicare Other

## 2013-10-27 ENCOUNTER — Encounter: Payer: Self-pay | Admitting: Internal Medicine

## 2013-10-27 ENCOUNTER — Encounter (INDEPENDENT_AMBULATORY_CARE_PROVIDER_SITE_OTHER): Payer: Self-pay

## 2013-10-27 ENCOUNTER — Telehealth: Payer: Self-pay | Admitting: Internal Medicine

## 2013-10-27 VITALS — BP 129/69 | HR 68 | Temp 98.6°F | Resp 18 | Ht 67.0 in | Wt 127.6 lb

## 2013-10-27 DIAGNOSIS — C7A1 Malignant poorly differentiated neuroendocrine tumors: Secondary | ICD-10-CM

## 2013-10-27 DIAGNOSIS — R5381 Other malaise: Secondary | ICD-10-CM

## 2013-10-27 DIAGNOSIS — T451X5A Adverse effect of antineoplastic and immunosuppressive drugs, initial encounter: Secondary | ICD-10-CM

## 2013-10-27 DIAGNOSIS — C3490 Malignant neoplasm of unspecified part of unspecified bronchus or lung: Secondary | ICD-10-CM

## 2013-10-27 DIAGNOSIS — R5383 Other fatigue: Secondary | ICD-10-CM

## 2013-10-27 DIAGNOSIS — C3491 Malignant neoplasm of unspecified part of right bronchus or lung: Secondary | ICD-10-CM

## 2013-10-27 DIAGNOSIS — C787 Secondary malignant neoplasm of liver and intrahepatic bile duct: Secondary | ICD-10-CM

## 2013-10-27 DIAGNOSIS — C7B8 Other secondary neuroendocrine tumors: Secondary | ICD-10-CM

## 2013-10-27 DIAGNOSIS — D6481 Anemia due to antineoplastic chemotherapy: Secondary | ICD-10-CM

## 2013-10-27 LAB — CBC WITH DIFFERENTIAL/PLATELET
BASO%: 1.4 % (ref 0.0–2.0)
Basophils Absolute: 0.1 10*3/uL (ref 0.0–0.1)
EOS%: 2.7 % (ref 0.0–7.0)
Eosinophils Absolute: 0.2 10*3/uL (ref 0.0–0.5)
HEMATOCRIT: 33.6 % — AB (ref 34.8–46.6)
HGB: 11.5 g/dL — ABNORMAL LOW (ref 11.6–15.9)
LYMPH#: 1.3 10*3/uL (ref 0.9–3.3)
LYMPH%: 16.2 % (ref 14.0–49.7)
MCH: 31.7 pg (ref 25.1–34.0)
MCHC: 34.1 g/dL (ref 31.5–36.0)
MCV: 92.9 fL (ref 79.5–101.0)
MONO#: 0.9 10*3/uL (ref 0.1–0.9)
MONO%: 10.8 % (ref 0.0–14.0)
NEUT#: 5.6 10*3/uL (ref 1.5–6.5)
NEUT%: 68.9 % (ref 38.4–76.8)
Platelets: 179 10*3/uL (ref 145–400)
RBC: 3.62 10*6/uL — AB (ref 3.70–5.45)
RDW: 18.7 % — AB (ref 11.2–14.5)
WBC: 8.2 10*3/uL (ref 3.9–10.3)

## 2013-10-27 LAB — COMPREHENSIVE METABOLIC PANEL (CC13)
ALT: 15 U/L (ref 0–55)
AST: 23 U/L (ref 5–34)
Albumin: 3.4 g/dL — ABNORMAL LOW (ref 3.5–5.0)
Alkaline Phosphatase: 86 U/L (ref 40–150)
Anion Gap: 9 mEq/L (ref 3–11)
BUN: 11 mg/dL (ref 7.0–26.0)
CALCIUM: 9.8 mg/dL (ref 8.4–10.4)
CHLORIDE: 105 meq/L (ref 98–109)
CO2: 22 meq/L (ref 22–29)
Creatinine: 0.8 mg/dL (ref 0.6–1.1)
Glucose: 88 mg/dl (ref 70–140)
Potassium: 4.2 mEq/L (ref 3.5–5.1)
SODIUM: 135 meq/L — AB (ref 136–145)
TOTAL PROTEIN: 6.8 g/dL (ref 6.4–8.3)
Total Bilirubin: 0.7 mg/dL (ref 0.20–1.20)

## 2013-10-27 NOTE — Progress Notes (Signed)
[place] [phone]   Fax:([phone]) 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   PRIOR THERAPY:   1) Status post left upper lobectomy with lymph node dissection.  2) Adjuvant systemic chemotherapy with cisplatin 75 mg region squared and Alimta 500 mg per meter squared given every 3 weeks for total of 4 cycles, status post 4 cycles. Last dose was given 03/02/2013.  3)  Systemic chemotherapy with carboplatin for AUC of 5 on day 1 and etoposide at 120 mg/M2 on days 1, 2 and 3 with Neulasta support on day 4 every 3 weeks. First dose of chemotherapy on 07/19/2013. Status post 4 cycles. Last cycle was on 09/20/2013 discontinued secondary to disease progression.   CURRENT THERAPY: Topotecan 3 mg/M2 on a weekly basis. First dose 11/03/2013  CHEMOTHERAPY INTENT: Palliative  CURRENT # OF CHEMOTHERAPY CYCLES: 0  CURRENT ANTIEMETICS: Zofran, dexamethasone and Compazine.  CURRENT SMOKING STATUS: Former smoker  ORAL CHEMOTHERAPY AND CONSENT: None  CURRENT BISPHOSPHONATES USE: None  PAIN MANAGEMENT: 0/10  NARCOTICS INDUCED CONSTIPATION: None  LIVING WILL AND CODE STATUS: ?    INTERVAL HISTORY: Sara Mcclure 68 y.o. female returns to the clinic today for followup visit accompanied by her husband. The patient has been off treatment for the last 2 weeks and she is feeling very well today. She denied having any significant fatigue weakness. She denied having any significant chest pain, but has shortness of breath with exertion, no cough or hemoptysis.  She has no weight loss or  night sweats. The patient denied having any nausea or vomiting. Her most recent CT scan of the chest, abdomen and pelvis showed evidence for disease progression and she is here for evaluation and discussion of her treatment options.   MEDICAL HISTORY: Past Medical History  Diagnosis Date  . GERD (gastroesophageal reflux disease)   . Asthma   . Hypertension   . Diabetes mellitus   . Thyroid disease   . Anxiety   . Vertigo   . Breast cancer   . COPD (chronic obstructive pulmonary disease)   . PONV (postoperative nausea and vomiting)     ALLERGIES:  is allergic to metformin and related.  MEDICATIONS:  Current Outpatient Prescriptions  Medication Sig Dispense Refill  . albuterol (ACCUNEB) 1.25 MG/3ML nebulizer solution Take 1 ampule by nebulization 2 (two) times daily as needed. For shortness of breath      . albuterol (PROVENTIL HFA;VENTOLIN HFA) 108 (90 BASE) MCG/ACT inhaler Inhale 2 puffs into the lungs every 6 (six) hours as needed. For shortness of breath      . ALPRAZolam (XANAX) 0.25 MG tablet Take 0.25 mg by mouth 3 (three) times daily as needed for anxiety.       . Alum Hydroxide-Mag Carbonate (GAVISCON PO) Take 1 tablet by mouth daily as needed (stomach).      Marland Kitchen aspirin EC 81 MG tablet Take 81 mg by mouth every morning.       . cetirizine (ZYRTEC) 10 MG tablet Take 10 mg by mouth daily as needed for allergies.      . Cholecalciferol (VITAMIN D) 2000 UNITS tablet Take 2,000 Units  by mouth daily with lunch.       . dextromethorphan-guaiFENesin (MUCINEX DM) 30-600 MG per 12 hr tablet Take 1 tablet by mouth every 12 (twelve) hours.      . Fluticasone-Salmeterol (ADVAIR) 250-50 MCG/DOSE AEPB Inhale 1 puff into the lungs every 12 (twelve) hours.      . fosinopril (MONOPRIL) 20 MG tablet Take 20 mg by mouth daily after breakfast.       . ibuprofen (ADVIL,MOTRIN) 200 MG tablet Take 200 mg by mouth every 8 (eight) hours as needed for pain.      Marland Kitchen levothyroxine (SYNTHROID, LEVOTHROID) 50  MCG tablet Take 50 mcg by mouth daily before breakfast.       . lidocaine-prilocaine (EMLA) cream Apply topically as needed. Apply to port 1 hour before chemotherapy.  30 g  0  . metoprolol tartrate (LOPRESSOR) 25 MG tablet Take 1 tablet (25 mg total) by mouth 2 (two) times daily.  60 tablet  1  . montelukast (SINGULAIR) 10 MG tablet Take 10 mg by mouth at bedtime.      . ondansetron (ZOFRAN-ODT) 8 MG disintegrating tablet Take 8 mg by mouth every 8 (eight) hours as needed for nausea.       . Polyvinyl Alcohol-Povidone (REFRESH OP) Place 2 drops into both eyes daily as needed (For watery eyes.).      Marland Kitchen PRESCRIPTION MEDICATION She receives her treatments at the [facility] with Dr. Julien Nordmann. She received a 3 day cycle from 07/19/13 to 07/21/13 of Etoposide 200mg  and then an injection of Neulasta 6mg  on 07/22/13.      Marland Kitchen prochlorperazine (COMPAZINE) 10 MG tablet Take 10 mg by mouth every 6 (six) hours as needed (For nausea.).       No current facility-administered medications for this visit.    SURGICAL HISTORY:  Past Surgical History  Procedure Laterality Date  . Cardiac catherization    . Breast lumpectomy    . Tubal ligation    . Video assisted thoracoscopy (vats)/wedge resection Left 11/19/2012    Procedure: VIDEO ASSISTED THORACOSCOPY (VATS)/WEDGE RESECTION;  Surgeon: Melrose Nakayama, MD;  Location: [facility];  Service: Thoracic;  Laterality: Left;  left upper lobe wedge resection  . Lobectomy Left 11/19/2012    Procedure: LOBECTOMY;  Surgeon: Melrose Nakayama, MD;  Location: [facility];  Service: Thoracic;  Laterality: Left;  left upper lobe  . Lymph node dissection Left 11/19/2012    Procedure: LYMPH NODE DISSECTION;  Surgeon: Melrose Nakayama, MD;  Location: [facility];  Service: Thoracic;  Laterality: Left;    REVIEW OF SYSTEMS:  Constitutional: negative Eyes: negative Ears, nose, mouth, throat, and face: negative Respiratory: positive for dyspnea on exertion Cardiovascular:  negative Gastrointestinal: negative Genitourinary:negative Integument/breast: negative Hematologic/lymphatic: negative Musculoskeletal:negative Neurological: negative Behavioral/Psych: negative Endocrine: negative Allergic/Immunologic: negative   PHYSICAL EXAMINATION: General appearance: alert, cooperative and no distress Head: Normocephalic, without obvious abnormality, atraumatic Neck: no adenopathy, no JVD, supple, symmetrical, trachea midline and thyroid not enlarged, symmetric, no tenderness/mass/nodules Lymph nodes: Cervical, supraclavicular, and axillary nodes normal. Resp: clear to auscultation bilaterally Back: symmetric, no curvature. ROM normal. No CVA tenderness. Cardio: regular rate and rhythm, S1, S2 normal, no murmur, click, rub or gallop GI: soft, non-tender; bowel sounds normal; no masses,  no organomegaly Extremities: extremities normal, atraumatic, no cyanosis or edema Neurologic: Alert and oriented X 3, normal strength and tone. Normal symmetric reflexes. Normal coordination and gait   ECOG PERFORMANCE STATUS: 1 - Symptomatic but completely ambulatory  Blood pressure 129/69, pulse 68, temperature 98.6 F (37 C), temperature source Oral, resp. rate 18, height 5\' 7"  (1.702 m), weight 127 lb 9.6 oz (57.879 kg).  LABORATORY DATA: Lab Results  Component Value Date   WBC 8.2 10/27/2013   HGB 11.5* 10/27/2013   HCT 33.6* 10/27/2013   MCV 92.9 10/27/2013   PLT 179 10/27/2013      Chemistry      Component Value Date/Time   NA 135* 10/27/2013 0950   NA 135 08/05/2013 1132   K 4.2 10/27/2013 0950   K 4.0 08/05/2013 1132   CL 101 08/05/2013 1132   CL 102 03/30/2013 0951   CO2 22 10/27/2013 0950   CO2 23 08/05/2013 1132   BUN 11.0 10/27/2013 0950   BUN 13 08/05/2013 1132   CREATININE 0.8 10/27/2013 0950   CREATININE 0.99 08/05/2013 1132      Component Value Date/Time   CALCIUM 9.8 10/27/2013 0950   CALCIUM 10.8* 08/05/2013 1132   ALKPHOS 86 10/27/2013 0950    ALKPHOS 65 11/21/2012 0445   AST 23 10/27/2013 0950   AST 15 11/21/2012 0445   ALT 15 10/27/2013 0950   ALT 16 11/21/2012 0445   BILITOT 0.70 10/27/2013 0950   BILITOT 0.4 11/21/2012 0445       RADIOGRAPHIC STUDIES: Ct Chest W Contrast  10/08/2013   CLINICAL DATA:  Small cell lung cancer.  EXAM: CT CHEST WITH CONTRAST  CT ABDOMEN AND PELVIS WITH AND WITHOUT CONTRAST  TECHNIQUE: Multidetector CT imaging of the chest was performed during intravenous contrast administration. Multidetector CT imaging of the abdomen and pelvis was performed following the standard protocol before and during bolus administration of intravenous contrast.  CONTRAST:  153mL OMNIPAQUE IOHEXOL 300 MG/ML  SOLN  COMPARISON:  CT 08/27/2013.  PET-CT 07/12/2013.  FINDINGS:   CT CHEST FINDINGS  AP window lymph node previously measuring 0.9 by 1.5 cm now measures 0.8 x 15 mm. , image number 23/series 2. AP window lymph node previously measuring 0.9 by 1.1 cm. , now measures 0.4 x 0 0.7 cm, image number 27/series 2. Small pretracheal and subcarinal PET negative nodes remains stable in appearance. Heart size is stable. Coronary artery disease.  Large airways are patent. Persistent atelectasis and small left pleural effusion. Postsurgical changes left lung. No focal pulmonary mass or new nodule. Tiny nodular density in the left lung noted on image number 24/series 4 may represent a vessel on end, but merits follow-up.  Port-A-Cath noted. Postsurgical changes left breast. Degenerative changes thoracic spine.    CT ABDOMEN AND PELVIS FINDINGS  Hepatic metastatic lesions have increased in number and size. Target lesion in the anterior peripheral aspect of the right lobe of the liver previously measuring 2.1 x 1.7 cm now measures 2.5 x 2.2 cm. Adjacent right hepatic lobe lesion previously measuring 1.8 x 1.7 cm now measures 2.6 x 2.3 cm. , image number 58/series 2. Prominent hepatic lesion in the anterior most aspect of the right lobe of the liver  previously measuring 2.0 x 1.8 cm now measures 3 x 2.3 cm, image number 63/series 2. As noted above innumerable new lesions are present. Spleen is normal. Pancreas is normal. No biliary or gallbladder distention.  Previous 1.1 x 1.3 cm left adrenal nodule remains stable. Right adrenal is normal. 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 Kidneys are otherwise normal. No hydronephrosis. No evidence of obstructing ureteral stone. The bladder is nondistended. Calcified uterine fibroids.  Adnexa are normal. No free pelvic fluid collections.  No significant adenopathy. Aortoiliac and visceral vascular atherosclerotic vascular calcification.  No inflammatory change in right or left lower quadrant. There is no bowel distention. Esophagogastric region is normal. No mesenteric masses.  Abdominal wall is intact. No significant hernia. Previously identified left iliac wing lytic lesion is stable.    IMPRESSION: 1. Significant progression of hepatic metastatic disease with increased number and size of hepatic lesions. 2. Previously identified mediastinal lymph nodes have become slightly smaller. 3. Stable left adrenal nodule stable. 4. Stable left iliac wing lytic lesion. 5. Questionable approximate 2-3 mm pulmonary nodule left lung. 6. Postsurgical changes left lung with stable small pleural effusion.   Electronically Signed   By: Marcello Moores  Register   On: 10/08/2013 09:36   ASSESSMENT AND PLAN: This is a very pleasant 68 years old white female recently diagnosed with neuroendocrine carcinoma, small cell with multiple liver metastases as well as left AP window lymphadenopathy, extrapleural nodal metastasis and left iliac bone lesion.  She has a history of neuroendocrine carcinoma, large cell type diagnosed as stage IIA in February of 2014 status post left upper lobectomy followed by 4 cycles of adjuvant chemotherapy with cisplatin and Alimta. She's currently being  treated with systemic chemotherapy in the form of carboplatin for an AUC of 5 given on day 1 and etoposide at 120 mg router squared on days 1, 2 and 3 with Neulasta support given on day 4 every 3 weeks, status post 4 cycles. She tolerated her treatment well except for the chemotherapy-induced anemia with significant fatigue. The recent CT scan of the chest, abdomen and pelvis showed evidence for disease progression especially in the liver. The patient felt much better after she received 2 units of PRBCs transfusion and she was off treatment for the last 2 weeks. I discussed with the patient options for her treatment including consideration of treatment with further chemotherapy versus palliative care and hospice referral. She is still interested in treatment.  I recommended for her treatment with topotecan 3 mg/M2 weekly. I discussed with the patient adverse effect of this treatment including but not limited to alopecia, myelosuppression, nausea and vomiting, peripheral neuropathy, liver or renal dysfunction. She is expected to start the first dose of systems treatment on 11/03/2013.  She would come back for followup visit in 3 weeks for evaluation and management any adverse effect of her treatment. She was advised to call immediately if she has any concerning symptoms in the interval.  The patient voices understanding of current disease status and treatment options and is in agreement with the current care plan.  All questions were answered. The patient knows to call the clinic with any problems, questions or concerns. We can certainly see the patient much sooner if necessary. I spent 15 minutes counseling the patient face to face. The total time spent in the appointment was 25 minutes.   Eilleen Kempf., MD 10/27/2013

## 2013-10-27 NOTE — Telephone Encounter (Signed)
gv and printed apt sched and avs for ptfor Jan and Feb...sed added tx.

## 2013-10-27 NOTE — Patient Instructions (Signed)
Smoking Cessation Quitting smoking is important to your health and has many advantages. However, it is not always easy to quit since nicotine is a very addictive drug. Often times, people try 3 times or more before being able to quit. This document explains the best ways for you to prepare to quit smoking. Quitting takes hard work and a lot of effort, but you can do it. ADVANTAGES OF QUITTING SMOKING  You will live longer, feel better, and live better.  Your body will feel the impact of quitting smoking almost immediately.  Within 20 minutes, blood pressure decreases. Your pulse returns to its normal level.  After 8 hours, carbon monoxide levels in the blood return to normal. Your oxygen level increases.  After 24 hours, the chance of having a heart attack starts to decrease. Your breath, hair, and body stop smelling like smoke.  After 48 hours, damaged nerve endings begin to recover. Your sense of taste and smell improve.  After 72 hours, the body is virtually free of nicotine. Your bronchial tubes relax and breathing becomes easier.  After 2 to 12 weeks, lungs can hold more air. Exercise becomes easier and circulation improves.  The risk of having a heart attack, stroke, cancer, or lung disease is greatly reduced.  After 1 year, the risk of coronary heart disease is cut in half.  After 5 years, the risk of stroke falls to the same as a nonsmoker.  After 10 years, the risk of lung cancer is cut in half and the risk of other cancers decreases significantly.  After 15 years, the risk of coronary heart disease drops, usually to the level of a nonsmoker.  If you are pregnant, quitting smoking will improve your chances of having a healthy baby.  The people you live with, especially any children, will be healthier.  You will have extra money to spend on things other than cigarettes. QUESTIONS TO THINK ABOUT BEFORE ATTEMPTING TO QUIT You may want to talk about your answers with your  caregiver.  Why do you want to quit?  If you tried to quit in the past, what helped and what did not?  What will be the most difficult situations for you after you quit? How will you plan to handle them?  Who can help you through the tough times? Your family? Friends? A caregiver?  What pleasures do you get from smoking? What ways can you still get pleasure if you quit? Here are some questions to ask your caregiver:  How can you help me to be successful at quitting?  What medicine do you think would be best for me and how should I take it?  What should I do if I need more help?  What is smoking withdrawal like? How can I get information on withdrawal? GET READY  Set a quit date.  Change your environment by getting rid of all cigarettes, ashtrays, matches, and lighters in your home, car, or work. Do not let people smoke in your home.  Review your past attempts to quit. Think about what worked and what did not. GET SUPPORT AND ENCOURAGEMENT You have a better chance of being successful if you have help. You can get support in many ways.  Tell your family, friends, and co-workers that you are going to quit and need their support. Ask them not to smoke around you.  Get individual, group, or telephone counseling and support. Programs are available at local hospitals and health centers. Call your local health department for   information about programs in your area.  Spiritual beliefs and practices may help some smokers quit.  Download a "quit meter" on your computer to keep track of quit statistics, such as how long you have gone without smoking, cigarettes not smoked, and money saved.  Get a self-help book about quitting smoking and staying off of tobacco. LEARN NEW SKILLS AND BEHAVIORS  Distract yourself from urges to smoke. Talk to someone, go for a walk, or occupy your time with a task.  Change your normal routine. Take a different route to work. Drink tea instead of coffee.  Eat breakfast in a different place.  Reduce your stress. Take a hot bath, exercise, or read a book.  Plan something enjoyable to do every day. Reward yourself for not smoking.  Explore interactive web-based programs that specialize in helping you quit. GET MEDICINE AND USE IT CORRECTLY Medicines can help you stop smoking and decrease the urge to smoke. Combining medicine with the above behavioral methods and support can greatly increase your chances of successfully quitting smoking.  Nicotine replacement therapy helps deliver nicotine to your body without the negative effects and risks of smoking. Nicotine replacement therapy includes nicotine gum, lozenges, inhalers, nasal sprays, and skin patches. Some may be available over-the-counter and others require a prescription.  Antidepressant medicine helps people abstain from smoking, but how this works is unknown. This medicine is available by prescription.  Nicotinic receptor partial agonist medicine simulates the effect of nicotine in your brain. This medicine is available by prescription. Ask your caregiver for advice about which medicines to use and how to use them based on your health history. Your caregiver will tell you what side effects to look out for if you choose to be on a medicine or therapy. Carefully read the information on the package. Do not use any other product containing nicotine while using a nicotine replacement product.  RELAPSE OR DIFFICULT SITUATIONS Most relapses occur within the first 3 months after quitting. Do not be discouraged if you start smoking again. Remember, most people try several times before finally quitting. You may have symptoms of withdrawal because your body is used to nicotine. You may crave cigarettes, be irritable, feel very hungry, cough often, get headaches, or have difficulty concentrating. The withdrawal symptoms are only temporary. They are strongest when you first quit, but they will go away within  10 14 days. To reduce the chances of relapse, try to:  Avoid drinking alcohol. Drinking lowers your chances of successfully quitting.  Reduce the amount of caffeine you consume. Once you quit smoking, the amount of caffeine in your body increases and can give you symptoms, such as a rapid heartbeat, sweating, and anxiety.  Avoid smokers because they can make you want to smoke.  Do not let weight gain distract you. Many smokers will gain weight when they quit, usually less than 10 pounds. Eat a healthy diet and stay active. You can always lose the weight gained after you quit.  Find ways to improve your mood other than smoking. FOR MORE INFORMATION  www.smokefree.gov  Document Released: 09/24/2001 Document Revised: 03/31/2012 Document Reviewed: 01/09/2012 [employer] Patient Information 2014 [employer].  

## 2013-11-03 ENCOUNTER — Other Ambulatory Visit: Payer: Medicare Other

## 2013-11-03 ENCOUNTER — Ambulatory Visit (HOSPITAL_BASED_OUTPATIENT_CLINIC_OR_DEPARTMENT_OTHER): Payer: Medicare Other

## 2013-11-03 ENCOUNTER — Encounter (INDEPENDENT_AMBULATORY_CARE_PROVIDER_SITE_OTHER): Payer: Self-pay

## 2013-11-03 ENCOUNTER — Other Ambulatory Visit (HOSPITAL_BASED_OUTPATIENT_CLINIC_OR_DEPARTMENT_OTHER): Payer: Medicare Other

## 2013-11-03 VITALS — BP 108/57 | HR 68 | Temp 98.5°F | Resp 20

## 2013-11-03 DIAGNOSIS — Z5111 Encounter for antineoplastic chemotherapy: Secondary | ICD-10-CM

## 2013-11-03 DIAGNOSIS — C7A1 Malignant poorly differentiated neuroendocrine tumors: Secondary | ICD-10-CM

## 2013-11-03 DIAGNOSIS — C3410 Malignant neoplasm of upper lobe, unspecified bronchus or lung: Secondary | ICD-10-CM

## 2013-11-03 DIAGNOSIS — C7B8 Other secondary neuroendocrine tumors: Secondary | ICD-10-CM

## 2013-11-03 DIAGNOSIS — C3492 Malignant neoplasm of unspecified part of left bronchus or lung: Secondary | ICD-10-CM

## 2013-11-03 DIAGNOSIS — C3490 Malignant neoplasm of unspecified part of unspecified bronchus or lung: Secondary | ICD-10-CM

## 2013-11-03 DIAGNOSIS — C787 Secondary malignant neoplasm of liver and intrahepatic bile duct: Secondary | ICD-10-CM

## 2013-11-03 LAB — CBC WITH DIFFERENTIAL/PLATELET
BASO%: 0.5 % (ref 0.0–2.0)
BASOS ABS: 0 10*3/uL (ref 0.0–0.1)
EOS%: 2.7 % (ref 0.0–7.0)
Eosinophils Absolute: 0.2 10*3/uL (ref 0.0–0.5)
HEMATOCRIT: 31.9 % — AB (ref 34.8–46.6)
HGB: 10.7 g/dL — ABNORMAL LOW (ref 11.6–15.9)
LYMPH%: 19.8 % (ref 14.0–49.7)
MCH: 30.6 pg (ref 25.1–34.0)
MCHC: 33.5 g/dL (ref 31.5–36.0)
MCV: 91.1 fL (ref 79.5–101.0)
MONO#: 0.8 10*3/uL (ref 0.1–0.9)
MONO%: 10.6 % (ref 0.0–14.0)
NEUT#: 4.9 10*3/uL (ref 1.5–6.5)
NEUT%: 66.4 % (ref 38.4–76.8)
Platelets: 156 10*3/uL (ref 145–400)
RBC: 3.5 10*6/uL — ABNORMAL LOW (ref 3.70–5.45)
RDW: 17.1 % — AB (ref 11.2–14.5)
WBC: 7.4 10*3/uL (ref 3.9–10.3)
lymph#: 1.5 10*3/uL (ref 0.9–3.3)

## 2013-11-03 LAB — COMPREHENSIVE METABOLIC PANEL (CC13)
ALK PHOS: 80 U/L (ref 40–150)
ALT: 13 U/L (ref 0–55)
AST: 21 U/L (ref 5–34)
Albumin: 3.3 g/dL — ABNORMAL LOW (ref 3.5–5.0)
Anion Gap: 7 mEq/L (ref 3–11)
BUN: 14.5 mg/dL (ref 7.0–26.0)
CO2: 24 mEq/L (ref 22–29)
Calcium: 9.6 mg/dL (ref 8.4–10.4)
Chloride: 105 mEq/L (ref 98–109)
Creatinine: 0.9 mg/dL (ref 0.6–1.1)
Glucose: 88 mg/dl (ref 70–140)
Potassium: 4.3 mEq/L (ref 3.5–5.1)
Sodium: 136 mEq/L (ref 136–145)
Total Bilirubin: 0.69 mg/dL (ref 0.20–1.20)
Total Protein: 6.4 g/dL (ref 6.4–8.3)

## 2013-11-03 MED ORDER — HEPARIN SOD (PORK) LOCK FLUSH 100 UNIT/ML IV SOLN
500.0000 [IU] | Freq: Once | INTRAVENOUS | Status: AC | PRN
  Administered 2013-11-03: 500 [IU]
  Filled 2013-11-03: qty 5

## 2013-11-03 MED ORDER — ONDANSETRON 8 MG/50ML IVPB (CHCC)
8.0000 mg | Freq: Once | INTRAVENOUS | Status: AC
  Administered 2013-11-03: 8 mg via INTRAVENOUS

## 2013-11-03 MED ORDER — SODIUM CHLORIDE 0.9 % IJ SOLN
10.0000 mL | INTRAMUSCULAR | Status: DC | PRN
  Administered 2013-11-03: 10 mL
  Filled 2013-11-03: qty 10

## 2013-11-03 MED ORDER — ONDANSETRON 8 MG/NS 50 ML IVPB
INTRAVENOUS | Status: AC
  Filled 2013-11-03: qty 8

## 2013-11-03 MED ORDER — DEXAMETHASONE SODIUM PHOSPHATE 10 MG/ML IJ SOLN
10.0000 mg | Freq: Once | INTRAMUSCULAR | Status: AC
  Administered 2013-11-03: 10 mg via INTRAVENOUS

## 2013-11-03 MED ORDER — DEXAMETHASONE SODIUM PHOSPHATE 10 MG/ML IJ SOLN
INTRAMUSCULAR | Status: AC
  Filled 2013-11-03: qty 1

## 2013-11-03 MED ORDER — TOPOTECAN HCL CHEMO INJECTION 4 MG
3.0000 mg/m2 | Freq: Once | INTRAVENOUS | Status: AC
  Administered 2013-11-03: 5 mg via INTRAVENOUS
  Filled 2013-11-03: qty 5

## 2013-11-03 MED ORDER — SODIUM CHLORIDE 0.9 % IV SOLN
Freq: Once | INTRAVENOUS | Status: AC
  Administered 2013-11-03: 13:00:00 via INTRAVENOUS

## 2013-11-03 NOTE — Patient Instructions (Signed)
[facility] Discharge Instructions for Patients Receiving Chemotherapy  Today you received the following chemotherapy agents topotecan.  To help prevent nausea and vomiting after your treatment, we encourage you to take your nausea medication zofran.   Topotecan injection What is this medicine? TOPOTECAN (TOE poe TEE kan) is a chemotherapy drug. It is used to treat lung cancer, ovarian cancer, and cervical cancer. This medicine may be used for other purposes; ask your health care provider or pharmacist if you have questions. COMMON BRAND NAME(S): Hycamtin What should I tell my health care provider before I take this medicine? They need to know if you have any of these conditions: -blood disorders -dehydration -diarrhea -immune system problems -infection (especially a virus infection such as chickenpox, cold sores, or herpes) -kidney disease -low blood counts, like low white cell, platelet, or red cell counts -recent or ongoing radiation therapy -an unusual or allergic reaction to topotecan, other medicines, foods, dyes, or preservatives -pregnant or trying to get pregnant -breast-feeding How should I use this medicine? This medicine is for infusion into a vein. It is usually given by a health care professional in a hospital or clinic setting. In rare cases, you might get this medicine at home. You will be taught how to give this medicine. Use exactly as directed. Take your medicine at regular intervals. Do not take your medicine more often than directed. It is important that you put your used needles and syringes in a special sharps container. Do not put them in a trash can. If you do not have a sharps container, call your pharmacist or healthcare provider to get one. Talk to your pediatrician regarding the use of this medicine in children. Special care may be needed. Overdosage: If you think you have taken too much of this medicine contact a poison control center or  emergency room at once. NOTE: This medicine is only for you. Do not share this medicine with others. What if I miss a dose? It is important not to miss your dose. Call your doctor or health care professional if you are unable to keep an appointment. What may interact with this medicine? -amiodarone -antiviral medicines for HIV or AIDS -cisplatin -clarithromycin -cyclosporine -diltiazem -erythromycin -grapefruit or grapefruit juice -medicines for fungal infections like ketoconazole and itraconazole -mefloquine -mifepristone, RU-486 -nicardipine -phenytoin -propafenone -quinidine -tacrolimus -tamoxifen -testosterone -vaccines -verapamil Talk to your prescriber or health care professional before taking any of these medicines: -aspirin -acetaminophen -ibuprofen -naproxen -ketoprofen This list may not describe all possible interactions. Give your health care provider a list of all the medicines, herbs, non-prescription drugs, or dietary supplements you use. Also tell them if you smoke, drink alcohol, or use illegal drugs. Some items may interact with your medicine. What should I watch for while using this medicine? This drug may make you feel generally unwell. This is not uncommon, as chemotherapy can affect healthy cells as well as cancer cells. Report any side effects. Continue your course of treatment even though you feel ill unless your doctor tells you to stop. Call your doctor or health care professional for advice if you get a fever, chills or sore throat, or other symptoms of a cold or flu. Do not treat yourself. This drug decreases your body's ability to fight infections. Try to avoid being around people who are sick. This medicine may increase your risk to bruise or bleed. Call your doctor or health care professional if you notice any unusual bleeding. Be careful brushing and flossing your  teeth or using a toothpick because you may get an infection or bleed more easily. If  you have any dental work done, tell your dentist you are receiving this medicine. Avoid taking products that contain aspirin, acetaminophen, ibuprofen, naproxen, or ketoprofen unless instructed by your doctor. These medicines may hide a fever. Do not become pregnant while taking this medicine. Women should inform their doctor if they wish to become pregnant or think they might be pregnant. There is a potential for serious side effects to an unborn child. Talk to your health care professional or pharmacist for more information. Do not breast-feed an infant while taking this medicine. What side effects may I notice from receiving this medicine? Side effects that you should report to your doctor or health care professional as soon as possible: -allergic reactions like skin rash, itching or hives, swelling of the face, lips, or tongue -breathing difficulties -diarrhea -dizziness -fever or chills, sore throat -mouth sores or pain -pain, tingling, numbness in the hands or feet -unusual bleeding or bruising -unusually weak or tired -yellowing of the eyes or skin Side effects that usually do not require medical attention (report to your doctor or health care professional if they continue or are bothersome): -hair loss -headache -loss of appetite -nausea, vomiting -stomach pain This list may not describe all possible side effects. Call your doctor for medical advice about side effects. You may report side effects to FDA at 1-800-FDA-1088. Where should I keep my medicine? Keep out of the reach of children. This drug is usually given in a hospital or clinic and will not be stored at home. In rare cases, this medicine may be given at home. If you are using this medicine at home, you will be instructed on how to store this medicine. Throw away any unused medicine after the expiration date on the label. NOTE: This sheet is a summary. It may not cover all possible information. If you have questions about  this medicine, talk to your doctor, pharmacist, or health care provider.  2014, Elsevier/Gold Standard. (2008-06-15 17:25:53)    If you develop nausea and vomiting that is not controlled by your nausea medication, call the clinic.   BELOW ARE SYMPTOMS THAT SHOULD BE REPORTED IMMEDIATELY:  *FEVER GREATER THAN 100.5 F  *CHILLS WITH OR WITHOUT FEVER  NAUSEA AND VOMITING THAT IS NOT CONTROLLED WITH YOUR NAUSEA MEDICATION  *UNUSUAL SHORTNESS OF BREATH  *UNUSUAL BRUISING OR BLEEDING  TENDERNESS IN MOUTH AND THROAT WITH OR WITHOUT PRESENCE OF ULCERS  *URINARY PROBLEMS  *BOWEL PROBLEMS  UNUSUAL RASH Items with * indicate a potential emergency and should be followed up as soon as possible.  Feel free to call the clinic you have any questions or concerns. The clinic phone number is [phone].

## 2013-11-04 ENCOUNTER — Telehealth: Payer: Self-pay | Admitting: *Deleted

## 2013-11-04 NOTE — Telephone Encounter (Signed)
NO NAUSEA OR VOMITING. PT. IS EATING AND FORCING FLUIDS. NO DIARRHEA. SHE HAD A GOOD BOWEL MOVEMENT THIS MORNING. NO PROBLEMS WITH HER MOUTH. PT. WILL CALL THIS OFFICE OR THE ON CALL PHYSICIAN SHOULD THE NEE ARISE.

## 2013-11-10 ENCOUNTER — Other Ambulatory Visit (HOSPITAL_BASED_OUTPATIENT_CLINIC_OR_DEPARTMENT_OTHER): Payer: Medicare Other

## 2013-11-10 ENCOUNTER — Other Ambulatory Visit: Payer: Self-pay | Admitting: Internal Medicine

## 2013-11-10 ENCOUNTER — Ambulatory Visit (HOSPITAL_BASED_OUTPATIENT_CLINIC_OR_DEPARTMENT_OTHER): Payer: Medicare Other

## 2013-11-10 VITALS — BP 107/52 | HR 73 | Temp 98.0°F | Resp 18

## 2013-11-10 DIAGNOSIS — C787 Secondary malignant neoplasm of liver and intrahepatic bile duct: Secondary | ICD-10-CM

## 2013-11-10 DIAGNOSIS — C3490 Malignant neoplasm of unspecified part of unspecified bronchus or lung: Secondary | ICD-10-CM

## 2013-11-10 DIAGNOSIS — C7A1 Malignant poorly differentiated neuroendocrine tumors: Secondary | ICD-10-CM

## 2013-11-10 DIAGNOSIS — Z5111 Encounter for antineoplastic chemotherapy: Secondary | ICD-10-CM

## 2013-11-10 DIAGNOSIS — C3492 Malignant neoplasm of unspecified part of left bronchus or lung: Secondary | ICD-10-CM

## 2013-11-10 DIAGNOSIS — C3410 Malignant neoplasm of upper lobe, unspecified bronchus or lung: Secondary | ICD-10-CM

## 2013-11-10 LAB — CBC WITH DIFFERENTIAL/PLATELET
BASO%: 0.9 % (ref 0.0–2.0)
BASOS ABS: 0.1 10*3/uL (ref 0.0–0.1)
EOS ABS: 0.2 10*3/uL (ref 0.0–0.5)
EOS%: 3.1 % (ref 0.0–7.0)
HEMATOCRIT: 29.7 % — AB (ref 34.8–46.6)
HGB: 10.1 g/dL — ABNORMAL LOW (ref 11.6–15.9)
LYMPH%: 28.3 % (ref 14.0–49.7)
MCH: 30.6 pg (ref 25.1–34.0)
MCHC: 34 g/dL (ref 31.5–36.0)
MCV: 90 fL (ref 79.5–101.0)
MONO#: 0.4 10*3/uL (ref 0.1–0.9)
MONO%: 6.2 % (ref 0.0–14.0)
NEUT%: 61.5 % (ref 38.4–76.8)
NEUTROS ABS: 4.2 10*3/uL (ref 1.5–6.5)
Platelets: 105 10*3/uL — ABNORMAL LOW (ref 145–400)
RBC: 3.3 10*6/uL — ABNORMAL LOW (ref 3.70–5.45)
RDW: 15.8 % — AB (ref 11.2–14.5)
WBC: 6.8 10*3/uL (ref 3.9–10.3)
lymph#: 1.9 10*3/uL (ref 0.9–3.3)

## 2013-11-10 LAB — COMPREHENSIVE METABOLIC PANEL (CC13)
ALK PHOS: 84 U/L (ref 40–150)
ALT: 11 U/L (ref 0–55)
AST: 14 U/L (ref 5–34)
Albumin: 3.2 g/dL — ABNORMAL LOW (ref 3.5–5.0)
Anion Gap: 8 mEq/L (ref 3–11)
BUN: 13.3 mg/dL (ref 7.0–26.0)
CO2: 25 mEq/L (ref 22–29)
Calcium: 9.5 mg/dL (ref 8.4–10.4)
Chloride: 103 mEq/L (ref 98–109)
Creatinine: 0.9 mg/dL (ref 0.6–1.1)
GLUCOSE: 143 mg/dL — AB (ref 70–140)
POTASSIUM: 4 meq/L (ref 3.5–5.1)
Sodium: 136 mEq/L (ref 136–145)
Total Bilirubin: 0.52 mg/dL (ref 0.20–1.20)
Total Protein: 6.3 g/dL — ABNORMAL LOW (ref 6.4–8.3)

## 2013-11-10 MED ORDER — ONDANSETRON 8 MG/NS 50 ML IVPB
INTRAVENOUS | Status: AC
  Filled 2013-11-10: qty 8

## 2013-11-10 MED ORDER — TOPOTECAN HCL CHEMO INJECTION 4 MG
3.0000 mg/m2 | Freq: Once | INTRAVENOUS | Status: AC
  Administered 2013-11-10: 5 mg via INTRAVENOUS
  Filled 2013-11-10: qty 5

## 2013-11-10 MED ORDER — DEXAMETHASONE SODIUM PHOSPHATE 10 MG/ML IJ SOLN
INTRAMUSCULAR | Status: AC
  Filled 2013-11-10: qty 1

## 2013-11-10 MED ORDER — SODIUM CHLORIDE 0.9 % IJ SOLN
10.0000 mL | INTRAMUSCULAR | Status: DC | PRN
  Administered 2013-11-10: 10 mL
  Filled 2013-11-10: qty 10

## 2013-11-10 MED ORDER — ONDANSETRON 8 MG/50ML IVPB (CHCC)
8.0000 mg | Freq: Once | INTRAVENOUS | Status: AC
  Administered 2013-11-10: 8 mg via INTRAVENOUS

## 2013-11-10 MED ORDER — SODIUM CHLORIDE 0.9 % IV SOLN
Freq: Once | INTRAVENOUS | Status: AC
  Administered 2013-11-10: 11:00:00 via INTRAVENOUS

## 2013-11-10 MED ORDER — HEPARIN SOD (PORK) LOCK FLUSH 100 UNIT/ML IV SOLN
500.0000 [IU] | Freq: Once | INTRAVENOUS | Status: AC | PRN
  Administered 2013-11-10: 500 [IU]
  Filled 2013-11-10: qty 5

## 2013-11-10 MED ORDER — DEXAMETHASONE SODIUM PHOSPHATE 10 MG/ML IJ SOLN
10.0000 mg | Freq: Once | INTRAMUSCULAR | Status: AC
  Administered 2013-11-10: 10 mg via INTRAVENOUS

## 2013-11-10 NOTE — Patient Instructions (Signed)
[facility] Discharge Instructions for Patients Receiving Chemotherapy  Today you received the following chemotherapy agents: Topetecan  To help prevent nausea and vomiting after your treatment, we encourage you to take your nausea medication as prescribed by your physician.   If you develop nausea and vomiting that is not controlled by your nausea medication, call the clinic.   BELOW ARE SYMPTOMS THAT SHOULD BE REPORTED IMMEDIATELY:  *FEVER GREATER THAN 100.5 F  *CHILLS WITH OR WITHOUT FEVER  NAUSEA AND VOMITING THAT IS NOT CONTROLLED WITH YOUR NAUSEA MEDICATION  *UNUSUAL SHORTNESS OF BREATH  *UNUSUAL BRUISING OR BLEEDING  TENDERNESS IN MOUTH AND THROAT WITH OR WITHOUT PRESENCE OF ULCERS  *URINARY PROBLEMS  *BOWEL PROBLEMS  UNUSUAL RASH Items with * indicate a potential emergency and should be followed up as soon as possible.  Feel free to call the clinic you have any questions or concerns. The clinic phone number is [phone].

## 2013-11-17 ENCOUNTER — Other Ambulatory Visit (HOSPITAL_BASED_OUTPATIENT_CLINIC_OR_DEPARTMENT_OTHER): Payer: Medicare Other

## 2013-11-17 ENCOUNTER — Encounter: Payer: Self-pay | Admitting: Physician Assistant

## 2013-11-17 ENCOUNTER — Ambulatory Visit: Payer: Medicare Other

## 2013-11-17 ENCOUNTER — Ambulatory Visit (HOSPITAL_BASED_OUTPATIENT_CLINIC_OR_DEPARTMENT_OTHER): Payer: Medicare Other | Admitting: Physician Assistant

## 2013-11-17 ENCOUNTER — Telehealth: Payer: Self-pay | Admitting: *Deleted

## 2013-11-17 VITALS — BP 111/60 | HR 75 | Temp 97.9°F | Resp 18 | Ht 67.0 in | Wt 127.0 lb

## 2013-11-17 DIAGNOSIS — D696 Thrombocytopenia, unspecified: Secondary | ICD-10-CM

## 2013-11-17 DIAGNOSIS — T451X5A Adverse effect of antineoplastic and immunosuppressive drugs, initial encounter: Secondary | ICD-10-CM

## 2013-11-17 DIAGNOSIS — D6481 Anemia due to antineoplastic chemotherapy: Secondary | ICD-10-CM

## 2013-11-17 DIAGNOSIS — C787 Secondary malignant neoplasm of liver and intrahepatic bile duct: Secondary | ICD-10-CM

## 2013-11-17 DIAGNOSIS — C3490 Malignant neoplasm of unspecified part of unspecified bronchus or lung: Secondary | ICD-10-CM

## 2013-11-17 DIAGNOSIS — C7A00 Malignant carcinoid tumor of unspecified site: Secondary | ICD-10-CM

## 2013-11-17 DIAGNOSIS — C7B8 Other secondary neuroendocrine tumors: Secondary | ICD-10-CM

## 2013-11-17 DIAGNOSIS — C3492 Malignant neoplasm of unspecified part of left bronchus or lung: Secondary | ICD-10-CM

## 2013-11-17 LAB — CBC WITH DIFFERENTIAL/PLATELET
BASO%: 1.4 % (ref 0.0–2.0)
BASOS ABS: 0.1 10*3/uL (ref 0.0–0.1)
EOS ABS: 0.1 10*3/uL (ref 0.0–0.5)
EOS%: 2.3 % (ref 0.0–7.0)
HCT: 26.8 % — ABNORMAL LOW (ref 34.8–46.6)
HEMOGLOBIN: 9.1 g/dL — AB (ref 11.6–15.9)
LYMPH#: 1.5 10*3/uL (ref 0.9–3.3)
LYMPH%: 34.3 % (ref 14.0–49.7)
MCH: 31.4 pg (ref 25.1–34.0)
MCHC: 34.2 g/dL (ref 31.5–36.0)
MCV: 91.8 fL (ref 79.5–101.0)
MONO#: 0.2 10*3/uL (ref 0.1–0.9)
MONO%: 4.7 % (ref 0.0–14.0)
NEUT%: 57.3 % (ref 38.4–76.8)
NEUTROS ABS: 2.5 10*3/uL (ref 1.5–6.5)
Platelets: 41 10*3/uL — ABNORMAL LOW (ref 145–400)
RBC: 2.92 10*6/uL — ABNORMAL LOW (ref 3.70–5.45)
RDW: 17.1 % — AB (ref 11.2–14.5)
WBC: 4.4 10*3/uL (ref 3.9–10.3)

## 2013-11-17 NOTE — Patient Instructions (Signed)
Go to emergency room for further management for any nosebleeds you were unable to manage at home Her chemotherapy is being held today secondary to your low platelet count Continue weekly labs and chemotherapy as scheduled providing her counts are within treatable range. Followup in 2 weeks

## 2013-11-17 NOTE — Telephone Encounter (Signed)
I have adjusted 2/18 appt

## 2013-11-17 NOTE — Telephone Encounter (Signed)
appts made and printed. Pt is aware that tx for 12/01/13 will be adjusted. i emailed MW to adjust the tx...td

## 2013-11-17 NOTE — Progress Notes (Addendum)
[facility] [phone]   Fax:(336) [place], MD [address] 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   PRIOR THERAPY:   1) Status post left upper lobectomy with lymph node dissection.  2) Adjuvant systemic chemotherapy with cisplatin 75 mg region squared and Alimta 500 mg per meter squared given every 3 weeks for total of 4 cycles, status post 4 cycles. Last dose was given 03/02/2013.  3)  Systemic chemotherapy with carboplatin for AUC of 5 on day 1 and etoposide at 120 mg/M2 on days 1, 2 and 3 with Neulasta support on day 4 every 3 weeks. First dose of chemotherapy on 07/19/2013. Status post 4 cycles. Last cycle was on 09/20/2013 discontinued secondary to disease progression.   CURRENT THERAPY: Topotecan 3 mg/M2 on a weekly basis. First dose 11/03/2013  CHEMOTHERAPY INTENT: Palliative  CURRENT # OF CHEMOTHERAPY CYCLES: 2  CURRENT ANTIEMETICS: Zofran, dexamethasone and Compazine.  CURRENT SMOKING STATUS: Former smoker  ORAL CHEMOTHERAPY AND CONSENT: None  CURRENT BISPHOSPHONATES USE: None  PAIN MANAGEMENT: 0/10  NARCOTICS INDUCED CONSTIPATION: None  LIVING WILL AND CODE STATUS: ?    INTERVAL HISTORY: Sara Mcclure 68 y.o. female returns to the clinic today for followup visit accompanied by her husband. She's been tolerating her weekly to petechia and chemotherapy without difficulty until recently. She states her the past several days she's had mild to moderate nosebleeds with yesterday being the most consistent and therefore worse day of the symptoms. She states that the blood with  drip if her head was leaning forward and she was passing a large blood clots from her nose. She denied any increased bruising or any other bleeding. She denied fever or chills. She denied having any significant fatigue or weakness. She denied having any significant chest pain, but continues to have shortness of breath with exertion, no cough or hemoptysis.  She has no weight loss or night sweats. The patient denied having any nausea or vomiting.   MEDICAL HISTORY: Past Medical History  Diagnosis Date  . GERD (gastroesophageal reflux disease)   . Asthma   . Hypertension   . Diabetes mellitus   . Thyroid disease   . Anxiety   . Vertigo   . Breast cancer   . COPD (chronic obstructive pulmonary disease)   . PONV (postoperative nausea and vomiting)     ALLERGIES:  is allergic to metformin and related.  MEDICATIONS:  Current Outpatient Prescriptions  Medication Sig Dispense Refill  . albuterol (ACCUNEB) 1.25 MG/3ML nebulizer solution Take 1 ampule by nebulization 2 (two) times daily as needed. For shortness of breath      . albuterol (PROVENTIL HFA;VENTOLIN HFA) 108 (90 BASE) MCG/ACT inhaler Inhale 2 puffs into the lungs every 6 (six) hours as needed. For shortness of breath      . aspirin EC 81 MG tablet Take 81 mg by mouth every morning.       . cetirizine (ZYRTEC) 10 MG tablet Take 10 mg by mouth daily as needed for allergies.      . Cholecalciferol (VITAMIN D) 2000 UNITS tablet Take 2,000 Units by mouth daily with lunch.       Marland Kitchen  dextromethorphan-guaiFENesin (MUCINEX DM) 30-600 MG per 12 hr tablet Take 1 tablet by mouth every 12 (twelve) hours.      . Fluticasone-Salmeterol (ADVAIR) 250-50 MCG/DOSE AEPB Inhale 1 puff into the lungs every 12 (twelve) hours.      . fosinopril (MONOPRIL) 20 MG tablet Take 20 mg by mouth daily after breakfast.       . levothyroxine (SYNTHROID, LEVOTHROID) 50 MCG tablet Take 50 mcg by mouth daily before breakfast.       . lidocaine-prilocaine (EMLA) cream  Apply topically as needed. Apply to port 1 hour before chemotherapy.  30 g  0  . metoprolol tartrate (LOPRESSOR) 25 MG tablet Take 1 tablet (25 mg total) by mouth 2 (two) times daily.  60 tablet  1  . montelukast (SINGULAIR) 10 MG tablet Take 10 mg by mouth at bedtime.      . ondansetron (ZOFRAN-ODT) 8 MG disintegrating tablet Take 8 mg by mouth every 8 (eight) hours as needed for nausea.       Marland Kitchen PRESCRIPTION MEDICATION She receives her treatments at the [facility] with Dr. Julien Nordmann. She received a 3 day cycle from 07/19/13 to 07/21/13 of Etoposide 200mg  and then an injection of Neulasta 6mg  on 07/22/13.      Marland Kitchen prochlorperazine (COMPAZINE) 10 MG tablet Take 10 mg by mouth every 6 (six) hours as needed (For nausea.).      Marland Kitchen ALPRAZolam (XANAX) 0.25 MG tablet Take 0.25 mg by mouth 3 (three) times daily as needed for anxiety.       . Alum Hydroxide-Mag Carbonate (GAVISCON PO) Take 1 tablet by mouth daily as needed (stomach).      Marland Kitchen ibuprofen (ADVIL,MOTRIN) 200 MG tablet Take 200 mg by mouth every 8 (eight) hours as needed for pain.      . Polyvinyl Alcohol-Povidone (REFRESH OP) Place 2 drops into both eyes daily as needed (For watery eyes.).       No current facility-administered medications for this visit.    SURGICAL HISTORY:  Past Surgical History  Procedure Laterality Date  . Cardiac catherization    . Breast lumpectomy    . Tubal ligation    . Video assisted thoracoscopy (vats)/wedge resection Left 11/19/2012    Procedure: VIDEO ASSISTED THORACOSCOPY (VATS)/WEDGE RESECTION;  Surgeon: Melrose Nakayama, MD;  Location: [facility];  Service: Thoracic;  Laterality: Left;  left upper lobe wedge resection  . Lobectomy Left 11/19/2012    Procedure: LOBECTOMY;  Surgeon: Melrose Nakayama, MD;  Location: [facility];  Service: Thoracic;  Laterality: Left;  left upper lobe  . Lymph node dissection Left 11/19/2012    Procedure: LYMPH NODE DISSECTION;  Surgeon: Melrose Nakayama, MD;  Location: [facility];  Service: Thoracic;  Laterality: Left;    REVIEW OF SYSTEMS:  Constitutional: negative Eyes: negative Ears, nose, mouth, throat, and face: positive for epistaxis Respiratory: positive for dyspnea on exertion Cardiovascular: negative Gastrointestinal: negative Genitourinary:negative Integument/breast: negative Hematologic/lymphatic: negative Musculoskeletal:negative Neurological: negative Behavioral/Psych: negative Endocrine: negative Allergic/Immunologic: negative   PHYSICAL EXAMINATION: General appearance: alert, cooperative and no distress Head: Normocephalic, without obvious abnormality, atraumatic Neck: no adenopathy, no JVD, supple, symmetrical, trachea midline and thyroid not enlarged, symmetric, no tenderness/mass/nodules Lymph nodes: Cervical, supraclavicular, and axillary nodes normal. Resp: clear to auscultation bilaterally Back: symmetric, no curvature. ROM normal. No CVA tenderness. Cardio: regular rate and rhythm, S1, S2 normal, no murmur, click, rub or gallop GI: soft, non-tender; bowel sounds normal; no masses,  no organomegaly Extremities: extremities  normal, atraumatic, no cyanosis or edema Neurologic: Alert and oriented X 3, normal strength and tone. Normal symmetric reflexes. Normal coordination and gait Of note, no active bleeding  ECOG PERFORMANCE STATUS: 1 - Symptomatic but completely ambulatory  Blood pressure 111/60, pulse 75, temperature 97.9 F (36.6 C), temperature source Oral, resp. rate 18, height 5\' 7"  (1.702 m), weight 127 lb (57.607 kg), SpO2 98.00%.  LABORATORY DATA: Lab Results  Component Value Date   WBC 4.4 11/17/2013   HGB 9.1* 11/17/2013   HCT 26.8* 11/17/2013   MCV 91.8 11/17/2013   PLT 41* 11/17/2013      Chemistry      Component Value Date/Time   NA 136 11/10/2013 1029   NA 135 08/05/2013 1132   K 4.0 11/10/2013 1029   K 4.0 08/05/2013 1132   CL 101 08/05/2013 1132   CL 102 03/30/2013 0951   CO2 25 11/10/2013 1029   CO2 23  08/05/2013 1132   BUN 13.3 11/10/2013 1029   BUN 13 08/05/2013 1132   CREATININE 0.9 11/10/2013 1029   CREATININE 0.99 08/05/2013 1132      Component Value Date/Time   CALCIUM 9.5 11/10/2013 1029   CALCIUM 10.8* 08/05/2013 1132   ALKPHOS 84 11/10/2013 1029   ALKPHOS 65 11/21/2012 0445   AST 14 11/10/2013 1029   AST 15 11/21/2012 0445   ALT 11 11/10/2013 1029   ALT 16 11/21/2012 0445   BILITOT 0.52 11/10/2013 1029   BILITOT 0.4 11/21/2012 0445       RADIOGRAPHIC STUDIES: Ct Chest W Contrast  10/08/2013   CLINICAL DATA:  Small cell lung cancer.  EXAM: CT CHEST WITH CONTRAST  CT ABDOMEN AND PELVIS WITH AND WITHOUT CONTRAST  TECHNIQUE: Multidetector CT imaging of the chest was performed during intravenous contrast administration. Multidetector CT imaging of the abdomen and pelvis was performed following the standard protocol before and during bolus administration of intravenous contrast.  CONTRAST:  126mL OMNIPAQUE IOHEXOL 300 MG/ML  SOLN  COMPARISON:  CT 08/27/2013.  PET-CT 07/12/2013.  FINDINGS:   CT CHEST FINDINGS  AP window lymph node previously measuring 0.9 by 1.5 cm now measures 0.8 x 15 mm. , image number 23/series 2. AP window lymph node previously measuring 0.9 by 1.1 cm. , now measures 0.4 x 0 0.7 cm, image number 27/series 2. Small pretracheal and subcarinal PET negative nodes remains stable in appearance. Heart size is stable. Coronary artery disease.  Large airways are patent. Persistent atelectasis and small left pleural effusion. Postsurgical changes left lung. No focal pulmonary mass or new nodule. Tiny nodular density in the left lung noted on image number 24/series 4 may represent a vessel on end, but merits follow-up.  Port-A-Cath noted. Postsurgical changes left breast. Degenerative changes thoracic spine.    CT ABDOMEN AND PELVIS FINDINGS  Hepatic metastatic lesions have increased in number and size. Target lesion in the anterior peripheral aspect of the right lobe of the liver  previously measuring 2.1 x 1.7 cm now measures 2.5 x 2.2 cm. Adjacent right hepatic lobe lesion previously measuring 1.8 x 1.7 cm now measures 2.6 x 2.3 cm. , image number 58/series 2. Prominent hepatic lesion in the anterior most aspect of the right lobe of the liver previously measuring 2.0 x 1.8 cm now measures 3 x 2.3 cm, image number 63/series 2. As noted above innumerable new lesions are present. Spleen is normal. Pancreas is normal. No biliary or gallbladder distention.  Previous 1.1 x 1.3 cm left adrenal nodule remains  stable. Right adrenal is normal. 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 Kidneys are otherwise normal. No hydronephrosis. No evidence of obstructing ureteral stone. The bladder is nondistended. Calcified uterine fibroids. Adnexa are normal. No free pelvic fluid collections.  No significant adenopathy. Aortoiliac and visceral vascular atherosclerotic vascular calcification.  No inflammatory change in right or left lower quadrant. There is no bowel distention. Esophagogastric region is normal. No mesenteric masses.  Abdominal wall is intact. No significant hernia. Previously identified left iliac wing lytic lesion is stable.    IMPRESSION: 1. Significant progression of hepatic metastatic disease with increased number and size of hepatic lesions. 2. Previously identified mediastinal lymph nodes have become slightly smaller. 3. Stable left adrenal nodule stable. 4. Stable left iliac wing lytic lesion. 5. Questionable approximate 2-3 mm pulmonary nodule left lung. 6. Postsurgical changes left lung with stable small pleural effusion.   Electronically Signed   By: Marcello Moores  Register   On: 10/08/2013 09:36   ASSESSMENT AND PLAN: This is a very pleasant 68 years old white female recently diagnosed with neuroendocrine carcinoma, small cell with multiple liver metastases as well as left AP window lymphadenopathy, extrapleural nodal  metastasis and left iliac bone lesion.  She has a history of neuroendocrine carcinoma, large cell type diagnosed as stage IIA in February of 2014 status post left upper lobectomy followed by 4 cycles of adjuvant chemotherapy with cisplatin and Alimta. She's currently being treated with systemic chemotherapy in the form of carboplatin for an AUC of 5 given on day 1 and etoposide at 120 mg router squared on days 1, 2 and 3 with Neulasta support given on day 4 every 3 weeks, status post 4 cycles. She tolerated her treatment well except for the chemotherapy-induced anemia with significant fatigue. The recent CT scan of the chest, abdomen and pelvis showed evidence for disease progression especially in the liver. She's currently being treated with systemic chemotherapy in the form of weekly to petechia and 3 mg per meter squared, status post 2 cycles. Patient was discussed with and also seen by Dr. Julien Nordmann. She is thrombocytopenic today the platelet count 41,000 which is likely impacting her episodes of epistaxis. Her platelet count is too low for her to proceed with weekly chemotherapy today as scheduled. We will cancel chemotherapy for this week and recheck counts in one week and proceed with chemotherapy for counts are within range. She'll followup in 2 weeks for another symptom management visit. Patient was advised to report to the emergency room if she had an episode of epistaxis she was unable to control. Both patient and husband voiced understanding.  She was advised to call immediately if she has any concerning symptoms in the interval.  The patient voices understanding of current disease status and treatment options and is in agreement with the current care plan.  All questions were answered. The patient knows to call the clinic with any problems, questions or concerns. We can certainly see the patient much sooner if necessary.    Carlton Adam, PA-C 11/17/2013  ADDENDUM: Hematology/Oncology  Attending: I had the face to face encounter with the patient today. I recommended her care plan. This is a very pleasant 68 years old white female with small cell lung cancer currently undergoing second line chemotherapy with single agent topotecan status post 2 doses. The patient tolerated her treatment fairly well with no significant adverse effects except for increasing fatigue and weakness. Her platelets count today are  low. She will not limited to proceed with cycle #3 today as scheduled. We will delay her treatment and resume it next week if her platelets count is 100,000 or more.  She was given bleeding precautions and was advised to call immediately if she has any concerning symptoms.  Disclaimer: This note was dictated with voice recognition software. Similar sounding words can inadvertently be transcribed and may not be corrected upon review. Eilleen Kempf., MD 11/17/2013

## 2013-11-24 ENCOUNTER — Ambulatory Visit (HOSPITAL_COMMUNITY)
Admission: RE | Admit: 2013-11-24 | Discharge: 2013-11-24 | Disposition: A | Discharge status: Home / Self Care | Payer: Medicare Other | Source: Ambulatory Visit | Attending: Internal Medicine | Admitting: Internal Medicine

## 2013-11-24 ENCOUNTER — Ambulatory Visit (HOSPITAL_BASED_OUTPATIENT_CLINIC_OR_DEPARTMENT_OTHER): Payer: Medicare Other

## 2013-11-24 ENCOUNTER — Other Ambulatory Visit: Payer: Self-pay | Admitting: *Deleted

## 2013-11-24 ENCOUNTER — Other Ambulatory Visit (HOSPITAL_BASED_OUTPATIENT_CLINIC_OR_DEPARTMENT_OTHER): Payer: Medicare Other

## 2013-11-24 VITALS — BP 112/67 | HR 64 | Temp 97.5°F | Resp 20

## 2013-11-24 DIAGNOSIS — D649 Anemia, unspecified: Secondary | ICD-10-CM

## 2013-11-24 DIAGNOSIS — C7A1 Malignant poorly differentiated neuroendocrine tumors: Secondary | ICD-10-CM

## 2013-11-24 DIAGNOSIS — C787 Secondary malignant neoplasm of liver and intrahepatic bile duct: Secondary | ICD-10-CM

## 2013-11-24 DIAGNOSIS — C3490 Malignant neoplasm of unspecified part of unspecified bronchus or lung: Secondary | ICD-10-CM

## 2013-11-24 LAB — CBC WITH DIFFERENTIAL/PLATELET
BASO%: 2.4 % — ABNORMAL HIGH (ref 0.0–2.0)
Basophils Absolute: 0.1 10*3/uL (ref 0.0–0.1)
EOS%: 1.8 % (ref 0.0–7.0)
Eosinophils Absolute: 0.1 10*3/uL (ref 0.0–0.5)
HCT: 25.1 % — ABNORMAL LOW (ref 34.8–46.6)
HGB: 8.5 g/dL — ABNORMAL LOW (ref 11.6–15.9)
LYMPH%: 40.7 % (ref 14.0–49.7)
MCH: 30.7 pg (ref 25.1–34.0)
MCHC: 33.9 g/dL (ref 31.5–36.0)
MCV: 90.6 fL (ref 79.5–101.0)
MONO#: 0.5 10*3/uL (ref 0.1–0.9)
MONO%: 13.4 % (ref 0.0–14.0)
NEUT#: 1.4 10*3/uL — ABNORMAL LOW (ref 1.5–6.5)
NEUT%: 41.7 % (ref 38.4–76.8)
NRBC: 0 % (ref 0–0)
PLATELETS: 75 10*3/uL — AB (ref 145–400)
RBC: 2.77 10*6/uL — AB (ref 3.70–5.45)
RDW: 16.3 % — ABNORMAL HIGH (ref 11.2–14.5)
WBC: 3.4 10*3/uL — ABNORMAL LOW (ref 3.9–10.3)
lymph#: 1.4 10*3/uL (ref 0.9–3.3)

## 2013-11-24 LAB — COMPREHENSIVE METABOLIC PANEL (CC13)
ALK PHOS: 92 U/L (ref 40–150)
ALT: 13 U/L (ref 0–55)
AST: 18 U/L (ref 5–34)
Albumin: 3.1 g/dL — ABNORMAL LOW (ref 3.5–5.0)
Anion Gap: 7 mEq/L (ref 3–11)
BUN: 11.4 mg/dL (ref 7.0–26.0)
CO2: 23 mEq/L (ref 22–29)
CREATININE: 0.8 mg/dL (ref 0.6–1.1)
Calcium: 9.5 mg/dL (ref 8.4–10.4)
Chloride: 105 mEq/L (ref 98–109)
GLUCOSE: 112 mg/dL (ref 70–140)
Potassium: 3.8 mEq/L (ref 3.5–5.1)
SODIUM: 136 meq/L (ref 136–145)
TOTAL PROTEIN: 6.1 g/dL — AB (ref 6.4–8.3)
Total Bilirubin: 0.32 mg/dL (ref 0.20–1.20)

## 2013-11-24 LAB — HOLD TUBE, BLOOD BANK

## 2013-11-24 LAB — PREPARE RBC (CROSSMATCH)

## 2013-11-24 MED ORDER — ACETAMINOPHEN 325 MG PO TABS
650.0000 mg | ORAL_TABLET | Freq: Once | ORAL | Status: AC
  Administered 2013-11-24: 650 mg via ORAL

## 2013-11-24 MED ORDER — DIPHENHYDRAMINE HCL 25 MG PO CAPS
ORAL_CAPSULE | ORAL | Status: AC
  Filled 2013-11-24: qty 1

## 2013-11-24 MED ORDER — HEPARIN SOD (PORK) LOCK FLUSH 100 UNIT/ML IV SOLN
500.0000 [IU] | Freq: Every day | INTRAVENOUS | Status: AC | PRN
  Administered 2013-11-24: 500 [IU]
  Filled 2013-11-24: qty 5

## 2013-11-24 MED ORDER — SODIUM CHLORIDE 0.9 % IV SOLN
250.0000 mL | Freq: Once | INTRAVENOUS | Status: AC
  Administered 2013-11-24: 250 mL via INTRAVENOUS

## 2013-11-24 MED ORDER — DIPHENHYDRAMINE HCL 25 MG PO CAPS
25.0000 mg | ORAL_CAPSULE | Freq: Once | ORAL | Status: AC
  Administered 2013-11-24: 25 mg via ORAL

## 2013-11-24 MED ORDER — ACETAMINOPHEN 325 MG PO TABS
ORAL_TABLET | ORAL | Status: AC
  Filled 2013-11-24: qty 2

## 2013-11-24 MED ORDER — SODIUM CHLORIDE 0.9 % IJ SOLN
10.0000 mL | INTRAMUSCULAR | Status: AC | PRN
  Administered 2013-11-24: 10 mL
  Filled 2013-11-24: qty 10

## 2013-11-24 NOTE — Progress Notes (Signed)
No chemo today d/t counts.  Patient to receive one unit blood.

## 2013-11-24 NOTE — Progress Notes (Signed)
Per Dr Vista Mink, okay to give 1 unit blood for hbg 8.5.  SLJ

## 2013-11-24 NOTE — Patient Instructions (Signed)
Blood Transfusion Information WHAT IS A BLOOD TRANSFUSION? A transfusion is the replacement of blood or some of its parts. Blood is made up of multiple cells which provide different functions.  Red blood cells carry oxygen and are used for blood loss replacement.  White blood cells fight against infection.  Platelets control bleeding.  Plasma helps clot blood.  Other blood products are available for specialized needs, such as hemophilia or other clotting disorders. BEFORE THE TRANSFUSION  Who gives blood for transfusions?   You may be able to donate blood to be used at a later date on yourself (autologous donation).  Relatives can be asked to donate blood. This is generally not any safer than if you have received blood from a stranger. The same precautions are taken to ensure safety when a relative's blood is donated.  Healthy volunteers who are fully evaluated to make sure their blood is safe. This is blood bank blood. Transfusion therapy is the safest it has ever been in the practice of medicine. Before blood is taken from a donor, a complete history is taken to make sure that person has no history of diseases nor engages in risky social behavior (examples are intravenous drug use or sexual activity with multiple partners). The donor's travel history is screened to minimize risk of transmitting infections, such as malaria. The donated blood is tested for signs of infectious diseases, such as HIV and hepatitis. The blood is then tested to be sure it is compatible with you in order to minimize the chance of a transfusion reaction. If you or a relative donates blood, this is often done in anticipation of surgery and is not appropriate for emergency situations. It takes many days to process the donated blood. RISKS AND COMPLICATIONS Although transfusion therapy is very safe and saves many lives, the main dangers of transfusion include:   Getting an infectious disease.  Developing a  transfusion reaction. This is an allergic reaction to something in the blood you were given. Every precaution is taken to prevent this. The decision to have a blood transfusion has been considered carefully by your caregiver before blood is given. Blood is not given unless the benefits outweigh the risks. AFTER THE TRANSFUSION  Right after receiving a blood transfusion, you will usually feel much better and more energetic. This is especially true if your red blood cells have gotten low (anemic). The transfusion raises the level of the red blood cells which carry oxygen, and this usually causes an energy increase.  The nurse administering the transfusion will monitor you carefully for complications. HOME CARE INSTRUCTIONS  No special instructions are needed after a transfusion. You may find your energy is better. Speak with your caregiver about any limitations on activity for underlying diseases you may have. SEEK MEDICAL CARE IF:   Your condition is not improving after your transfusion.  You develop redness or irritation at the intravenous (IV) site. SEEK IMMEDIATE MEDICAL CARE IF:  Any of the following symptoms occur over the next 12 hours:  Shaking chills.  You have a temperature by mouth above 102 F (38.9 C), not controlled by medicine.  Chest, back, or muscle pain.  People around you feel you are not acting correctly or are confused.  Shortness of breath or difficulty breathing.  Dizziness and fainting.  You get a rash or develop hives.  You have a decrease in urine output.  Your urine turns a dark color or changes to pink, red, or brown. Any of the following   symptoms occur over the next 10 days:  You have a temperature by mouth above 102 F (38.9 C), not controlled by medicine.  Shortness of breath.  Weakness after normal activity.  The white part of the eye turns yellow (jaundice).  You have a decrease in the amount of urine or are urinating less often.  Your  urine turns a dark color or changes to pink, red, or brown. Document Released: 09/27/2000 Document Revised: 12/23/2011 Document Reviewed: 05/16/2008 [employer] Patient Information 2014 [employer].  

## 2013-11-25 ENCOUNTER — Other Ambulatory Visit: Payer: Self-pay | Admitting: Internal Medicine

## 2013-11-25 ENCOUNTER — Ambulatory Visit: Payer: Self-pay | Admitting: Obstetrics & Gynecology

## 2013-11-25 DIAGNOSIS — C3490 Malignant neoplasm of unspecified part of unspecified bronchus or lung: Secondary | ICD-10-CM

## 2013-11-25 LAB — TYPE AND SCREEN
ABO/RH(D): A POS
ANTIBODY SCREEN: NEGATIVE
Unit division: 0

## 2013-12-01 ENCOUNTER — Ambulatory Visit (HOSPITAL_BASED_OUTPATIENT_CLINIC_OR_DEPARTMENT_OTHER): Payer: Medicare Other | Admitting: Physician Assistant

## 2013-12-01 ENCOUNTER — Ambulatory Visit (HOSPITAL_COMMUNITY)
Admission: RE | Admit: 2013-12-01 | Discharge: 2013-12-01 | Disposition: A | Discharge status: Home / Self Care | Payer: Medicare Other | Source: Ambulatory Visit | Attending: Physician Assistant | Admitting: Physician Assistant

## 2013-12-01 ENCOUNTER — Other Ambulatory Visit (HOSPITAL_BASED_OUTPATIENT_CLINIC_OR_DEPARTMENT_OTHER): Payer: Medicare Other

## 2013-12-01 ENCOUNTER — Other Ambulatory Visit: Payer: Medicare Other

## 2013-12-01 ENCOUNTER — Ambulatory Visit (HOSPITAL_BASED_OUTPATIENT_CLINIC_OR_DEPARTMENT_OTHER): Payer: Medicare Other

## 2013-12-01 ENCOUNTER — Telehealth: Payer: Self-pay | Admitting: Internal Medicine

## 2013-12-01 ENCOUNTER — Encounter: Payer: Self-pay | Admitting: Physician Assistant

## 2013-12-01 VITALS — BP 138/63 | HR 64 | Temp 98.0°F | Resp 18 | Ht 67.0 in | Wt 126.9 lb

## 2013-12-01 DIAGNOSIS — C50919 Malignant neoplasm of unspecified site of unspecified female breast: Secondary | ICD-10-CM | POA: Insufficient documentation

## 2013-12-01 DIAGNOSIS — R079 Chest pain, unspecified: Secondary | ICD-10-CM | POA: Insufficient documentation

## 2013-12-01 DIAGNOSIS — C7B8 Other secondary neuroendocrine tumors: Secondary | ICD-10-CM

## 2013-12-01 DIAGNOSIS — C787 Secondary malignant neoplasm of liver and intrahepatic bile duct: Secondary | ICD-10-CM

## 2013-12-01 DIAGNOSIS — R0989 Other specified symptoms and signs involving the circulatory and respiratory systems: Secondary | ICD-10-CM

## 2013-12-01 DIAGNOSIS — J449 Chronic obstructive pulmonary disease, unspecified: Secondary | ICD-10-CM | POA: Insufficient documentation

## 2013-12-01 DIAGNOSIS — R05 Cough: Secondary | ICD-10-CM | POA: Insufficient documentation

## 2013-12-01 DIAGNOSIS — J4489 Other specified chronic obstructive pulmonary disease: Secondary | ICD-10-CM | POA: Insufficient documentation

## 2013-12-01 DIAGNOSIS — Z902 Acquired absence of lung [part of]: Secondary | ICD-10-CM | POA: Insufficient documentation

## 2013-12-01 DIAGNOSIS — C3490 Malignant neoplasm of unspecified part of unspecified bronchus or lung: Secondary | ICD-10-CM

## 2013-12-01 DIAGNOSIS — C7A090 Malignant carcinoid tumor of the bronchus and lung: Secondary | ICD-10-CM

## 2013-12-01 DIAGNOSIS — I10 Essential (primary) hypertension: Secondary | ICD-10-CM | POA: Insufficient documentation

## 2013-12-01 DIAGNOSIS — R0609 Other forms of dyspnea: Secondary | ICD-10-CM

## 2013-12-01 DIAGNOSIS — J45909 Unspecified asthma, uncomplicated: Secondary | ICD-10-CM | POA: Insufficient documentation

## 2013-12-01 DIAGNOSIS — R059 Cough, unspecified: Secondary | ICD-10-CM

## 2013-12-01 DIAGNOSIS — Z5111 Encounter for antineoplastic chemotherapy: Secondary | ICD-10-CM

## 2013-12-01 DIAGNOSIS — E119 Type 2 diabetes mellitus without complications: Secondary | ICD-10-CM | POA: Insufficient documentation

## 2013-12-01 DIAGNOSIS — C3410 Malignant neoplasm of upper lobe, unspecified bronchus or lung: Secondary | ICD-10-CM

## 2013-12-01 LAB — COMPREHENSIVE METABOLIC PANEL (CC13)
ALK PHOS: 91 U/L (ref 40–150)
ALT: 15 U/L (ref 0–55)
AST: 19 U/L (ref 5–34)
Albumin: 3.2 g/dL — ABNORMAL LOW (ref 3.5–5.0)
Anion Gap: 8 mEq/L (ref 3–11)
BUN: 12.9 mg/dL (ref 7.0–26.0)
CALCIUM: 10.1 mg/dL (ref 8.4–10.4)
CHLORIDE: 105 meq/L (ref 98–109)
CO2: 24 mEq/L (ref 22–29)
CREATININE: 0.8 mg/dL (ref 0.6–1.1)
Glucose: 86 mg/dl (ref 70–140)
Potassium: 4.2 mEq/L (ref 3.5–5.1)
Sodium: 136 mEq/L (ref 136–145)
Total Bilirubin: 0.5 mg/dL (ref 0.20–1.20)
Total Protein: 6.4 g/dL (ref 6.4–8.3)

## 2013-12-01 LAB — CBC WITH DIFFERENTIAL/PLATELET
BASO%: 0.8 % (ref 0.0–2.0)
Basophils Absolute: 0 10*3/uL (ref 0.0–0.1)
EOS%: 1.1 % (ref 0.0–7.0)
Eosinophils Absolute: 0 10*3/uL (ref 0.0–0.5)
HEMATOCRIT: 29.8 % — AB (ref 34.8–46.6)
HGB: 10 g/dL — ABNORMAL LOW (ref 11.6–15.9)
LYMPH%: 35.1 % (ref 14.0–49.7)
MCH: 30.7 pg (ref 25.1–34.0)
MCHC: 33.6 g/dL (ref 31.5–36.0)
MCV: 91.4 fL (ref 79.5–101.0)
MONO#: 0.8 10*3/uL (ref 0.1–0.9)
MONO%: 21.3 % — AB (ref 0.0–14.0)
NEUT#: 1.5 10*3/uL (ref 1.5–6.5)
NEUT%: 41.7 % (ref 38.4–76.8)
NRBC: 0 % (ref 0–0)
PLATELETS: 128 10*3/uL — AB (ref 145–400)
RBC: 3.26 10*6/uL — AB (ref 3.70–5.45)
RDW: 16.2 % — ABNORMAL HIGH (ref 11.2–14.5)
WBC: 3.6 10*3/uL — AB (ref 3.9–10.3)
lymph#: 1.3 10*3/uL (ref 0.9–3.3)

## 2013-12-01 MED ORDER — DEXAMETHASONE SODIUM PHOSPHATE 10 MG/ML IJ SOLN
INTRAMUSCULAR | Status: AC
  Filled 2013-12-01: qty 1

## 2013-12-01 MED ORDER — HYDROCODONE-ACETAMINOPHEN 5-325 MG PO TABS: 1.0000 | ORAL_TABLET | Freq: Four times a day (QID) | ORAL | Status: AC | PRN

## 2013-12-01 MED ORDER — ONDANSETRON 8 MG/50ML IVPB (CHCC)
8.0000 mg | Freq: Once | INTRAVENOUS | Status: AC
  Administered 2013-12-01: 8 mg via INTRAVENOUS

## 2013-12-01 MED ORDER — HEPARIN SOD (PORK) LOCK FLUSH 100 UNIT/ML IV SOLN
500.0000 [IU] | Freq: Once | INTRAVENOUS | Status: AC | PRN
  Administered 2013-12-01: 500 [IU]
  Filled 2013-12-01: qty 5

## 2013-12-01 MED ORDER — ONDANSETRON 8 MG/NS 50 ML IVPB
INTRAVENOUS | Status: AC
  Filled 2013-12-01: qty 8

## 2013-12-01 MED ORDER — TOPOTECAN HCL CHEMO INJECTION 4 MG
3.0000 mg/m2 | Freq: Once | INTRAVENOUS | Status: AC
  Administered 2013-12-01: 5 mg via INTRAVENOUS
  Filled 2013-12-01: qty 5

## 2013-12-01 MED ORDER — SODIUM CHLORIDE 0.9 % IJ SOLN
10.0000 mL | INTRAMUSCULAR | Status: DC | PRN
  Administered 2013-12-01: 10 mL
  Filled 2013-12-01: qty 10

## 2013-12-01 MED ORDER — SODIUM CHLORIDE 0.9 % IV SOLN
Freq: Once | INTRAVENOUS | Status: AC
  Administered 2013-12-01: 12:00:00 via INTRAVENOUS

## 2013-12-01 MED ORDER — DEXAMETHASONE SODIUM PHOSPHATE 10 MG/ML IJ SOLN
10.0000 mg | Freq: Once | INTRAMUSCULAR | Status: AC
  Administered 2013-12-01: 10 mg via INTRAVENOUS

## 2013-12-01 NOTE — Patient Instructions (Signed)
Your chest x-ray did not show any evidence of pneumonia Take the pain medication as prescribed let us know if this is not a dressing your pain adequately Continue with your weekly labs and chemotherapy as scheduled Followup in 2 weeks

## 2013-12-01 NOTE — Telephone Encounter (Signed)
gv and printed appt sched and avs for pt for Feb and March....sed added tx.

## 2013-12-01 NOTE — Progress Notes (Addendum)
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   PRIOR THERAPY:   1) Status post left upper lobectomy with lymph node dissection.  2) Adjuvant systemic chemotherapy with cisplatin 75 mg region squared and Alimta 500 mg per meter squared given every 3 weeks for total of 4 cycles, status post 4 cycles. Last dose was given 03/02/2013.  3)  Systemic chemotherapy with carboplatin for AUC of 5 on day 1 and etoposide at 120 mg/M2 on days 1, 2 and 3 with Neulasta support on day 4 every 3 weeks. First dose of chemotherapy on 07/19/2013. Status post 4 cycles. Last cycle was on 09/20/2013 discontinued secondary to disease progression.   CURRENT THERAPY: Topotecan 3 mg/M2 on a weekly basis. First dose 11/03/2013. Status post 3 weekly cycles  CHEMOTHERAPY INTENT: Palliative  CURRENT # OF CHEMOTHERAPY CYCLES: 4  CURRENT ANTIEMETICS: Zofran, dexamethasone and Compazine.  CURRENT SMOKING STATUS: Former smoker  ORAL CHEMOTHERAPY AND CONSENT: None  CURRENT BISPHOSPHONATES USE: None  PAIN MANAGEMENT: 0/10  NARCOTICS INDUCED CONSTIPATION: None  LIVING WILL AND CODE STATUS: ?    INTERVAL HISTORY: Sara Mcclure 68 y.o. female returns to the clinic today for followup visit accompanied by her husband. She's been tolerating her weekly to petechia and chemotherapy without difficulty. She's had no further nosebleeds. She has however had a wet  sounding cough and right rib cage pain that started Friday. She denied any specific fever although she did not  check her temperature. She is always cold so does not report any distinct chills. She's been taking 400 mg of ibuprofen twice daily with no significant effect on the pain. She describes it as 9 on a 0-10 scale which is worse with coughing.  She denied any increased bruising or any other bleeding. She denied fever or chills. She denied having any significant fatigue or weakness. She denied having any other significant chest pain, but continues to have shortness of breath with exertion, no cough or hemoptysis.  She has no weight loss or night sweats. The patient denied having any nausea or vomiting.   MEDICAL HISTORY: Past Medical History  Diagnosis Date  . GERD (gastroesophageal reflux disease)   . Asthma   . Hypertension   . Diabetes mellitus   . Thyroid disease   . Anxiety   . Vertigo   . Breast cancer   . COPD (chronic obstructive pulmonary disease)   . PONV (postoperative nausea and vomiting)     ALLERGIES:  is allergic to metformin and related.  MEDICATIONS:  Current Outpatient Prescriptions  Medication Sig Dispense Refill  . albuterol (ACCUNEB) 1.25 MG/3ML nebulizer solution Take 1 ampule by nebulization 2 (two) times daily as needed. For shortness of breath      . albuterol (PROVENTIL HFA;VENTOLIN HFA) 108 (90 BASE) MCG/ACT inhaler Inhale 2 puffs into the lungs every 6 (six) hours as needed. For shortness of breath      . ALPRAZolam (XANAX) 0.25 MG tablet Take 0.25 mg by mouth 3 (three) times daily as needed for anxiety.       Marland Kitchen aspirin EC 81 MG tablet  Take 81 mg by mouth every morning.       . Cholecalciferol (VITAMIN D) 2000 UNITS tablet Take 2,000 Units by mouth daily with lunch.       . dextromethorphan-guaiFENesin (MUCINEX DM) 30-600 MG per 12 hr tablet Take 1 tablet by mouth every 12 (twelve) hours.      . Fluticasone-Salmeterol (ADVAIR) 250-50 MCG/DOSE AEPB Inhale 1 puff into the lungs every 12 (twelve) hours.      . fosinopril (MONOPRIL) 20 MG tablet Take 20 mg by mouth  daily after breakfast.       . ibuprofen (ADVIL,MOTRIN) 200 MG tablet Take 200 mg by mouth every 8 (eight) hours as needed for pain.      Marland Kitchen levothyroxine (SYNTHROID, LEVOTHROID) 50 MCG tablet Take 50 mcg by mouth daily before breakfast.       . lidocaine-prilocaine (EMLA) cream APPLY TO PORT 1 HOUR BEFORE CHEMOTHERAPY  30 g  0  . metoprolol tartrate (LOPRESSOR) 25 MG tablet Take 1 tablet (25 mg total) by mouth 2 (two) times daily.  60 tablet  1  . montelukast (SINGULAIR) 10 MG tablet Take 10 mg by mouth at bedtime.      Marland Kitchen PRESCRIPTION MEDICATION She receives her treatments at the [facility] with Dr. Julien Nordmann. She received a 3 day cycle from 07/19/13 to 07/21/13 of Etoposide 200mg  and then an injection of Neulasta 6mg  on 07/22/13.      Marland Kitchen prochlorperazine (COMPAZINE) 10 MG tablet Take 10 mg by mouth every 6 (six) hours as needed (For nausea.).      Marland Kitchen Alum Hydroxide-Mag Carbonate (GAVISCON PO) Take 1 tablet by mouth daily as needed (stomach).      . cetirizine (ZYRTEC) 10 MG tablet Take 10 mg by mouth daily as needed for allergies.      Marland Kitchen HYDROcodone-acetaminophen (NORCO/VICODIN) 5-325 MG per tablet Take 1 tablet by mouth every 6 (six) hours as needed for moderate pain.  40 tablet  0  . ondansetron (ZOFRAN-ODT) 8 MG disintegrating tablet Take 8 mg by mouth every 8 (eight) hours as needed for nausea.       . Polyvinyl Alcohol-Povidone (REFRESH OP) Place 2 drops into both eyes daily as needed (For watery eyes.).       No current facility-administered medications for this visit.   Facility-Administered Medications Ordered in Other Visits  Medication Dose Route Frequency Provider Last Rate Last Dose  . sodium chloride 0.9 % injection 10 mL  10 mL Intracatheter PRN Curt Bears, MD   10 mL at 12/01/13 1350    SURGICAL HISTORY:  Past Surgical History  Procedure Laterality Date  . Cardiac catherization    . Breast lumpectomy    . Tubal ligation    . Video assisted thoracoscopy  (vats)/wedge resection Left 11/19/2012    Procedure: VIDEO ASSISTED THORACOSCOPY (VATS)/WEDGE RESECTION;  Surgeon: Melrose Nakayama, MD;  Location: [facility];  Service: Thoracic;  Laterality: Left;  left upper lobe wedge resection  . Lobectomy Left 11/19/2012    Procedure: LOBECTOMY;  Surgeon: Melrose Nakayama, MD;  Location: [facility];  Service: Thoracic;  Laterality: Left;  left upper lobe  . Lymph node dissection Left 11/19/2012    Procedure: LYMPH NODE DISSECTION;  Surgeon: Melrose Nakayama, MD;  Location: [facility];  Service: Thoracic;  Laterality: Left;    REVIEW OF SYSTEMS:  Constitutional: negative Eyes: negative Ears, nose, mouth, throat, and face: negative Respiratory: positive for cough and dyspnea on exertion Cardiovascular: negative Gastrointestinal: negative  Genitourinary:negative Integument/breast: negative Hematologic/lymphatic: negative Musculoskeletal:positive for bone pain Neurological: negative Behavioral/Psych: negative Endocrine: negative Allergic/Immunologic: negative   PHYSICAL EXAMINATION: General appearance: alert, cooperative and no distress Head: Normocephalic, without obvious abnormality, atraumatic Neck: no adenopathy, no JVD, supple, symmetrical, trachea midline and thyroid not enlarged, symmetric, no tenderness/mass/nodules Lymph nodes: Cervical, supraclavicular, and axillary nodes normal. Resp: clear to auscultation bilaterally Back: symmetric, no curvature. ROM normal. No CVA tenderness. Cardio: regular rate and rhythm, S1, S2 normal, no murmur, click, rub or gallop GI: soft, non-tender; bowel sounds normal; no masses,  no organomegaly Extremities: extremities normal, atraumatic, no cyanosis or edema Neurologic: Alert and oriented X 3, normal strength and tone. Normal symmetric reflexes. Normal coordination and gait   ECOG PERFORMANCE STATUS: 1 - Symptomatic but completely ambulatory  Blood pressure 138/63, pulse 64, temperature 98 F (36.7 C),  temperature source Oral, resp. rate 18, height 5\' 7"  (1.702 m), weight 126 lb 14.4 oz (57.561 kg), SpO2 100.00%.  LABORATORY DATA: Lab Results  Component Value Date   WBC 3.6* 12/01/2013   HGB 10.0* 12/01/2013   HCT 29.8* 12/01/2013   MCV 91.4 12/01/2013   PLT 128* 12/01/2013      Chemistry      Component Value Date/Time   NA 136 12/01/2013 0942   NA 135 08/05/2013 1132   K 4.2 12/01/2013 0942   K 4.0 08/05/2013 1132   CL 101 08/05/2013 1132   CL 102 03/30/2013 0951   CO2 24 12/01/2013 0942   CO2 23 08/05/2013 1132   BUN 12.9 12/01/2013 0942   BUN 13 08/05/2013 1132   CREATININE 0.8 12/01/2013 0942   CREATININE 0.99 08/05/2013 1132      Component Value Date/Time   CALCIUM 10.1 12/01/2013 0942   CALCIUM 10.8* 08/05/2013 1132   ALKPHOS 91 12/01/2013 0942   ALKPHOS 65 11/21/2012 0445   AST 19 12/01/2013 0942   AST 15 11/21/2012 0445   ALT 15 12/01/2013 0942   ALT 16 11/21/2012 0445   BILITOT 0.50 12/01/2013 0942   BILITOT 0.4 11/21/2012 0445       RADIOGRAPHIC STUDIES: Dg Chest 2 View  12/01/2013   CLINICAL DATA:  Cough, right rib pain under breast question pneumonia, history asthma, hypertension, diabetes, breast cancer, COPD  EXAM: CHEST  2 VIEW  COMPARISON:  CT chest 10/08/2013  FINDINGS: Right jugular Port-A-Cath stable tip projecting over SVC.  Normal heart size, mediastinal contours, and pulmonary vascularity.  Atherosclerotic calcification aorta.  Surgical clips at left hilum the superior retraction of the left hilum suggesting prior left upper lobectomy.  Persistent left pleural effusion, question slightly increased versus prior CT.  Underlying COPD.  No definite acute infiltrate or pneumothorax.  Bones demineralized.  IMPRESSION: COPD changes with evidence of prior left upper lobectomy.  Persistent left pleural effusion, question slightly increased versus prior CT.  No definite acute infiltrate.   Electronically Signed   By: Lavonia Dana M.D.   On: 12/01/2013 11:49     ASSESSMENT AND  PLAN: This is a very pleasant 68 years old white female recently diagnosed with neuroendocrine carcinoma, small cell with multiple liver metastases as well as left AP window lymphadenopathy, extrapleural nodal metastasis and left iliac bone lesion.  She has a history of neuroendocrine carcinoma, large cell type diagnosed as stage IIA in February of 2014 status post left upper lobectomy followed by 4 cycles of adjuvant chemotherapy with cisplatin and Alimta. She's currently being treated with systemic chemotherapy in the form of carboplatin for an  AUC of 5 given on day 1 and etoposide at 120 mg router squared on days 1, 2 and 3 with Neulasta support given on day 4 every 3 weeks, status post 4 cycles. She tolerated her treatment well except for the chemotherapy-induced anemia with significant fatigue. The recent CT scan of the chest, abdomen and pelvis showed evidence for disease progression especially in the liver. She's currently being treated with systemic chemotherapy in the form of weekly topotecan 3 mg per meter squared, status post 3 cycles. Patient was discussed with and also seen by Dr. Julien Nordmann. we will obtain a chest x-ray to evaluate for possible pneumonia. For pain the patient was asked to discontinue the ibuprofen. She was given a prescription for Vicodin 5/325 mg tablets, one tablet by mouth every 6 hours as needed for pain a total of 40 tablets with no refill. She'll continue with her weekly labs and chemotherapy as scheduled and followup in 2 weeks for another symptom management visit. Patient was advised to report to the emergency room if she had an episode of epistaxis she was unable to control. Both patient and husband voiced understanding.  She was advised to call immediately if she has any concerning symptoms in the interval.  The patient voices understanding of current disease status and treatment options and is in agreement with the current care plan.  All questions were answered. The  patient knows to call the clinic with any problems, questions or concerns. We can certainly see the patient much sooner if necessary.    Carlton Adam, PA-C 12/01/2013  ADDENDUM: Hematology/Oncology Attending: I had face to face encounter with the patient today. I recommended her care plan. This is a very pleasant 68 years old white female with metastatic small cell lung cancer currently undergoing second line chemotherapy with single agent topotecan status post 3 cycles. She is tolerating her treatment fairly well except for pain at the right lower rib cage started 2 days ago. She denied having any significant shortness of breath, cough or hemoptysis. The patient takes ibuprofen for her pain management. We will order a chest x-ray to rule out any possible pneumonia rib fracture. The patient will be started on Vicodin 5/325 mg by mouth every 6 hours as needed for pain. She will continue her chemotherapy today as scheduled. She would come back for followup visit in 2 weeks for reevaluation and management any adverse effect of her treatment. She was advised to call immediately if she has any concerning symptoms in the interval.  Disclaimer: This note was dictated with voice recognition software. Similar sounding words can inadvertently be transcribed and may not be corrected upon review. Eilleen Kempf., MD 12/01/2013

## 2013-12-01 NOTE — Patient Instructions (Signed)
[facility] Discharge Instructions for Patients Receiving Chemotherapy  Today you received the following chemotherapy agent: Topotecan   To help prevent nausea and vomiting after your treatment, we encourage you to take your nausea medication as prescribed.   If you develop nausea and vomiting that is not controlled by your nausea medication, call the clinic.   BELOW ARE SYMPTOMS THAT SHOULD BE REPORTED IMMEDIATELY:  *FEVER GREATER THAN 100.5 F  *CHILLS WITH OR WITHOUT FEVER  NAUSEA AND VOMITING THAT IS NOT CONTROLLED WITH YOUR NAUSEA MEDICATION  *UNUSUAL SHORTNESS OF BREATH  *UNUSUAL BRUISING OR BLEEDING  TENDERNESS IN MOUTH AND THROAT WITH OR WITHOUT PRESENCE OF ULCERS  *URINARY PROBLEMS  *BOWEL PROBLEMS  UNUSUAL RASH Items with * indicate a potential emergency and should be followed up as soon as possible.  Feel free to call the clinic you have any questions or concerns. The clinic phone number is [phone].

## 2013-12-08 ENCOUNTER — Ambulatory Visit (HOSPITAL_BASED_OUTPATIENT_CLINIC_OR_DEPARTMENT_OTHER): Payer: Medicare Other

## 2013-12-08 ENCOUNTER — Other Ambulatory Visit (HOSPITAL_BASED_OUTPATIENT_CLINIC_OR_DEPARTMENT_OTHER): Payer: Medicare Other

## 2013-12-08 VITALS — BP 118/47 | HR 64 | Temp 98.1°F | Resp 18

## 2013-12-08 DIAGNOSIS — C7A090 Malignant carcinoid tumor of the bronchus and lung: Secondary | ICD-10-CM

## 2013-12-08 DIAGNOSIS — C3490 Malignant neoplasm of unspecified part of unspecified bronchus or lung: Secondary | ICD-10-CM

## 2013-12-08 DIAGNOSIS — C7B8 Other secondary neuroendocrine tumors: Secondary | ICD-10-CM

## 2013-12-08 DIAGNOSIS — C3410 Malignant neoplasm of upper lobe, unspecified bronchus or lung: Secondary | ICD-10-CM

## 2013-12-08 DIAGNOSIS — Z5111 Encounter for antineoplastic chemotherapy: Secondary | ICD-10-CM

## 2013-12-08 DIAGNOSIS — C787 Secondary malignant neoplasm of liver and intrahepatic bile duct: Secondary | ICD-10-CM

## 2013-12-08 LAB — CBC WITH DIFFERENTIAL/PLATELET
BASO%: 0.7 % (ref 0.0–2.0)
Basophils Absolute: 0 10*3/uL (ref 0.0–0.1)
EOS%: 0.7 % (ref 0.0–7.0)
Eosinophils Absolute: 0 10*3/uL (ref 0.0–0.5)
HCT: 29.1 % — ABNORMAL LOW (ref 34.8–46.6)
HGB: 9.8 g/dL — ABNORMAL LOW (ref 11.6–15.9)
LYMPH#: 1.6 10*3/uL (ref 0.9–3.3)
LYMPH%: 39.2 % (ref 14.0–49.7)
MCH: 30.1 pg (ref 25.1–34.0)
MCHC: 33.7 g/dL (ref 31.5–36.0)
MCV: 89.3 fL (ref 79.5–101.0)
MONO#: 0.3 10*3/uL (ref 0.1–0.9)
MONO%: 8 % (ref 0.0–14.0)
NEUT%: 51.4 % (ref 38.4–76.8)
NEUTROS ABS: 2.1 10*3/uL (ref 1.5–6.5)
NRBC: 0 % (ref 0–0)
Platelets: 97 10*3/uL — ABNORMAL LOW (ref 145–400)
RBC: 3.26 10*6/uL — AB (ref 3.70–5.45)
RDW: 15.4 % — AB (ref 11.2–14.5)
WBC: 4.1 10*3/uL (ref 3.9–10.3)

## 2013-12-08 LAB — COMPREHENSIVE METABOLIC PANEL (CC13)
ALBUMIN: 3.1 g/dL — AB (ref 3.5–5.0)
ALT: 14 U/L (ref 0–55)
ANION GAP: 4 meq/L (ref 3–11)
AST: 18 U/L (ref 5–34)
Alkaline Phosphatase: 97 U/L (ref 40–150)
BILIRUBIN TOTAL: 0.38 mg/dL (ref 0.20–1.20)
BUN: 14 mg/dL (ref 7.0–26.0)
CALCIUM: 9.4 mg/dL (ref 8.4–10.4)
CHLORIDE: 103 meq/L (ref 98–109)
CO2: 27 mEq/L (ref 22–29)
Creatinine: 0.8 mg/dL (ref 0.6–1.1)
GLUCOSE: 107 mg/dL (ref 70–140)
POTASSIUM: 4 meq/L (ref 3.5–5.1)
Sodium: 134 mEq/L — ABNORMAL LOW (ref 136–145)
TOTAL PROTEIN: 6.4 g/dL (ref 6.4–8.3)

## 2013-12-08 MED ORDER — SODIUM CHLORIDE 0.9 % IV SOLN
Freq: Once | INTRAVENOUS | Status: AC
  Administered 2013-12-08: 11:00:00 via INTRAVENOUS

## 2013-12-08 MED ORDER — TOPOTECAN HCL CHEMO INJECTION 4 MG
3.0000 mg/m2 | Freq: Once | INTRAVENOUS | Status: AC
  Administered 2013-12-08: 5 mg via INTRAVENOUS
  Filled 2013-12-08: qty 5

## 2013-12-08 MED ORDER — HEPARIN SOD (PORK) LOCK FLUSH 100 UNIT/ML IV SOLN
500.0000 [IU] | Freq: Once | INTRAVENOUS | Status: AC | PRN
  Administered 2013-12-08: 500 [IU]
  Filled 2013-12-08: qty 5

## 2013-12-08 MED ORDER — DEXAMETHASONE SODIUM PHOSPHATE 10 MG/ML IJ SOLN
INTRAMUSCULAR | Status: AC
  Filled 2013-12-08: qty 1

## 2013-12-08 MED ORDER — ONDANSETRON 8 MG/50ML IVPB (CHCC)
8.0000 mg | Freq: Once | INTRAVENOUS | Status: AC
  Administered 2013-12-08: 8 mg via INTRAVENOUS

## 2013-12-08 MED ORDER — ONDANSETRON 8 MG/NS 50 ML IVPB
INTRAVENOUS | Status: AC
  Filled 2013-12-08: qty 8

## 2013-12-08 MED ORDER — SODIUM CHLORIDE 0.9 % IJ SOLN
10.0000 mL | INTRAMUSCULAR | Status: DC | PRN
  Administered 2013-12-08: 10 mL
  Filled 2013-12-08: qty 10

## 2013-12-08 MED ORDER — DEXAMETHASONE SODIUM PHOSPHATE 10 MG/ML IJ SOLN
10.0000 mg | Freq: Once | INTRAMUSCULAR | Status: AC
  Administered 2013-12-08: 10 mg via INTRAVENOUS

## 2013-12-08 NOTE — Patient Instructions (Signed)
[facility] Discharge Instructions for Patients Receiving Chemotherapy  Today you received the following chemotherapy agents Topotecan.  To help prevent nausea and vomiting after your treatment, we encourage you to take your nausea medication.   If you develop nausea and vomiting that is not controlled by your nausea medication, call the clinic.   BELOW ARE SYMPTOMS THAT SHOULD BE REPORTED IMMEDIATELY:  *FEVER GREATER THAN 100.5 F  *CHILLS WITH OR WITHOUT FEVER  NAUSEA AND VOMITING THAT IS NOT CONTROLLED WITH YOUR NAUSEA MEDICATION  *UNUSUAL SHORTNESS OF BREATH  *UNUSUAL BRUISING OR BLEEDING  TENDERNESS IN MOUTH AND THROAT WITH OR WITHOUT PRESENCE OF ULCERS  *URINARY PROBLEMS  *BOWEL PROBLEMS  UNUSUAL RASH Items with * indicate a potential emergency and should be followed up as soon as possible.  Feel free to call the clinic you have any questions or concerns. The clinic phone number is [phone].

## 2013-12-15 ENCOUNTER — Ambulatory Visit: Payer: Medicare Other

## 2013-12-15 ENCOUNTER — Other Ambulatory Visit (HOSPITAL_BASED_OUTPATIENT_CLINIC_OR_DEPARTMENT_OTHER): Payer: Medicare Other

## 2013-12-15 DIAGNOSIS — C3490 Malignant neoplasm of unspecified part of unspecified bronchus or lung: Secondary | ICD-10-CM

## 2013-12-15 LAB — CBC WITH DIFFERENTIAL/PLATELET
BASO%: 0.6 % (ref 0.0–2.0)
Basophils Absolute: 0 10*3/uL (ref 0.0–0.1)
EOS%: 1.2 % (ref 0.0–7.0)
Eosinophils Absolute: 0 10*3/uL (ref 0.0–0.5)
HEMATOCRIT: 26.2 % — AB (ref 34.8–46.6)
HGB: 9 g/dL — ABNORMAL LOW (ref 11.6–15.9)
LYMPH#: 1.6 10*3/uL (ref 0.9–3.3)
LYMPH%: 46.8 % (ref 14.0–49.7)
MCH: 30.5 pg (ref 25.1–34.0)
MCHC: 34.4 g/dL (ref 31.5–36.0)
MCV: 88.8 fL (ref 79.5–101.0)
MONO#: 0.2 10*3/uL (ref 0.1–0.9)
MONO%: 6.2 % (ref 0.0–14.0)
NEUT#: 1.5 10*3/uL (ref 1.5–6.5)
NEUT%: 45.2 % (ref 38.4–76.8)
NRBC: 0 % (ref 0–0)
Platelets: 34 10*3/uL — ABNORMAL LOW (ref 145–400)
RBC: 2.95 10*6/uL — AB (ref 3.70–5.45)
RDW: 15.1 % — ABNORMAL HIGH (ref 11.2–14.5)
WBC: 3.4 10*3/uL — AB (ref 3.9–10.3)

## 2013-12-15 NOTE — Progress Notes (Signed)
Reviewed labs with pt and bleeding precautions . Chemo cancelled today per Dr Julien Nordmann.

## 2013-12-22 ENCOUNTER — Ambulatory Visit (HOSPITAL_COMMUNITY)
Admission: RE | Admit: 2013-12-22 | Discharge: 2013-12-22 | Disposition: A | Discharge status: Home / Self Care | Payer: Medicare Other | Source: Ambulatory Visit | Attending: Internal Medicine | Admitting: Internal Medicine

## 2013-12-22 ENCOUNTER — Ambulatory Visit (HOSPITAL_BASED_OUTPATIENT_CLINIC_OR_DEPARTMENT_OTHER): Payer: Medicare Other

## 2013-12-22 ENCOUNTER — Encounter: Payer: Self-pay | Admitting: Internal Medicine

## 2013-12-22 ENCOUNTER — Telehealth: Payer: Self-pay | Admitting: Internal Medicine

## 2013-12-22 ENCOUNTER — Other Ambulatory Visit (HOSPITAL_BASED_OUTPATIENT_CLINIC_OR_DEPARTMENT_OTHER): Payer: Medicare Other

## 2013-12-22 ENCOUNTER — Other Ambulatory Visit: Payer: Self-pay | Admitting: *Deleted

## 2013-12-22 ENCOUNTER — Ambulatory Visit (HOSPITAL_BASED_OUTPATIENT_CLINIC_OR_DEPARTMENT_OTHER): Payer: Medicare Other | Admitting: Internal Medicine

## 2013-12-22 VITALS — BP 107/52 | HR 80 | Temp 98.6°F | Resp 18 | Wt 128.2 lb

## 2013-12-22 DIAGNOSIS — C3490 Malignant neoplasm of unspecified part of unspecified bronchus or lung: Secondary | ICD-10-CM

## 2013-12-22 DIAGNOSIS — D696 Thrombocytopenia, unspecified: Secondary | ICD-10-CM

## 2013-12-22 DIAGNOSIS — Z5111 Encounter for antineoplastic chemotherapy: Secondary | ICD-10-CM

## 2013-12-22 DIAGNOSIS — C7A090 Malignant carcinoid tumor of the bronchus and lung: Secondary | ICD-10-CM

## 2013-12-22 DIAGNOSIS — C3410 Malignant neoplasm of upper lobe, unspecified bronchus or lung: Secondary | ICD-10-CM

## 2013-12-22 DIAGNOSIS — D649 Anemia, unspecified: Secondary | ICD-10-CM | POA: Insufficient documentation

## 2013-12-22 DIAGNOSIS — T451X5A Adverse effect of antineoplastic and immunosuppressive drugs, initial encounter: Secondary | ICD-10-CM

## 2013-12-22 DIAGNOSIS — C787 Secondary malignant neoplasm of liver and intrahepatic bile duct: Secondary | ICD-10-CM

## 2013-12-22 DIAGNOSIS — R5383 Other fatigue: Secondary | ICD-10-CM

## 2013-12-22 DIAGNOSIS — D6481 Anemia due to antineoplastic chemotherapy: Secondary | ICD-10-CM

## 2013-12-22 DIAGNOSIS — R5381 Other malaise: Secondary | ICD-10-CM

## 2013-12-22 LAB — COMPREHENSIVE METABOLIC PANEL (CC13)
ALT: 17 U/L (ref 0–55)
ANION GAP: 8 meq/L (ref 3–11)
AST: 23 U/L (ref 5–34)
Albumin: 3 g/dL — ABNORMAL LOW (ref 3.5–5.0)
Alkaline Phosphatase: 98 U/L (ref 40–150)
BILIRUBIN TOTAL: 0.35 mg/dL (ref 0.20–1.20)
BUN: 13.2 mg/dL (ref 7.0–26.0)
CO2: 24 meq/L (ref 22–29)
Calcium: 9.7 mg/dL (ref 8.4–10.4)
Chloride: 102 mEq/L (ref 98–109)
Creatinine: 0.9 mg/dL (ref 0.6–1.1)
GLUCOSE: 101 mg/dL (ref 70–140)
Potassium: 4.1 mEq/L (ref 3.5–5.1)
Sodium: 134 mEq/L — ABNORMAL LOW (ref 136–145)
Total Protein: 6.3 g/dL — ABNORMAL LOW (ref 6.4–8.3)

## 2013-12-22 LAB — PREPARE RBC (CROSSMATCH)

## 2013-12-22 LAB — CBC WITH DIFFERENTIAL/PLATELET
BASO%: 0.7 % (ref 0.0–2.0)
Basophils Absolute: 0 10*3/uL (ref 0.0–0.1)
EOS%: 1.7 % (ref 0.0–7.0)
Eosinophils Absolute: 0.1 10*3/uL (ref 0.0–0.5)
HCT: 25.1 % — ABNORMAL LOW (ref 34.8–46.6)
HGB: 8.4 g/dL — ABNORMAL LOW (ref 11.6–15.9)
LYMPH%: 33.7 % (ref 14.0–49.7)
MCH: 30.3 pg (ref 25.1–34.0)
MCHC: 33.5 g/dL (ref 31.5–36.0)
MCV: 90.6 fL (ref 79.5–101.0)
MONO#: 0.6 10*3/uL (ref 0.1–0.9)
MONO%: 19.5 % — ABNORMAL HIGH (ref 0.0–14.0)
NEUT%: 44.4 % (ref 38.4–76.8)
NEUTROS ABS: 1.3 10*3/uL — AB (ref 1.5–6.5)
Platelets: 101 10*3/uL — ABNORMAL LOW (ref 145–400)
RBC: 2.77 10*6/uL — ABNORMAL LOW (ref 3.70–5.45)
RDW: 16.3 % — ABNORMAL HIGH (ref 11.2–14.5)
WBC: 3 10*3/uL — ABNORMAL LOW (ref 3.9–10.3)
lymph#: 1 10*3/uL (ref 0.9–3.3)

## 2013-12-22 MED ORDER — DEXAMETHASONE SODIUM PHOSPHATE 10 MG/ML IJ SOLN
INTRAMUSCULAR | Status: AC
  Filled 2013-12-22: qty 1

## 2013-12-22 MED ORDER — DEXAMETHASONE SODIUM PHOSPHATE 10 MG/ML IJ SOLN
10.0000 mg | Freq: Once | INTRAMUSCULAR | Status: AC
  Administered 2013-12-22: 10 mg via INTRAVENOUS

## 2013-12-22 MED ORDER — HEPARIN SOD (PORK) LOCK FLUSH 100 UNIT/ML IV SOLN
500.0000 [IU] | Freq: Once | INTRAVENOUS | Status: AC | PRN
  Administered 2013-12-22: 500 [IU]
  Filled 2013-12-22: qty 5

## 2013-12-22 MED ORDER — ONDANSETRON 8 MG/NS 50 ML IVPB
INTRAVENOUS | Status: AC
  Filled 2013-12-22: qty 8

## 2013-12-22 MED ORDER — TOPOTECAN HCL CHEMO INJECTION 4 MG
3.0000 mg/m2 | Freq: Once | INTRAVENOUS | Status: AC
  Administered 2013-12-22: 5 mg via INTRAVENOUS
  Filled 2013-12-22: qty 5

## 2013-12-22 MED ORDER — ONDANSETRON 8 MG/50ML IVPB (CHCC)
8.0000 mg | Freq: Once | INTRAVENOUS | Status: AC
  Administered 2013-12-22: 8 mg via INTRAVENOUS

## 2013-12-22 MED ORDER — SODIUM CHLORIDE 0.9 % IV SOLN
Freq: Once | INTRAVENOUS | Status: AC
  Administered 2013-12-22: 13:00:00 via INTRAVENOUS

## 2013-12-22 MED ORDER — SODIUM CHLORIDE 0.9 % IJ SOLN
10.0000 mL | INTRAMUSCULAR | Status: DC | PRN
  Administered 2013-12-22: 10 mL
  Filled 2013-12-22: qty 10

## 2013-12-22 NOTE — Progress Notes (Signed)
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   PRIOR THERAPY:   1) Status post left upper lobectomy with lymph node dissection.  2) Adjuvant systemic chemotherapy with cisplatin 75 mg region squared and Alimta 500 mg per meter squared given every 3 weeks for total of 4 cycles, status post 4 cycles. Last dose was given 03/02/2013.  3)  Systemic chemotherapy with carboplatin for AUC of 5 on day 1 and etoposide at 120 mg/M2 on days 1, 2 and 3 with Neulasta support on day 4 every 3 weeks. First dose of chemotherapy on 07/19/2013. Status post 4 cycles. Last cycle was on 09/20/2013 discontinued secondary to disease progression.   CURRENT THERAPY: Topotecan 3 mg/M2 on a weekly basis, status post 5 cycles. First dose 11/03/2013  CHEMOTHERAPY INTENT: Palliative  CURRENT # OF CHEMOTHERAPY CYCLES: 6  CURRENT ANTIEMETICS: Zofran, dexamethasone and Compazine.  CURRENT SMOKING STATUS: Former smoker  ORAL CHEMOTHERAPY AND CONSENT: None  CURRENT BISPHOSPHONATES USE: None  PAIN MANAGEMENT: 0/10  NARCOTICS INDUCED CONSTIPATION: None  LIVING WILL AND CODE STATUS: ?    INTERVAL HISTORY: Sara Mcclure 68 y.o. female returns to the clinic today for followup visit accompanied by her husband. The patient missed her treatment last week secondary to thrombocytopenia. She continues to complain of increasing fatigue and weakness. She denied having any significant chest pain, but has shortness of breath with exertion, no cough or hemoptysis.  She has no  weight loss or night sweats. The patient denied having any nausea or vomiting. She is here today to resume her weekly treatment with topotecan.  MEDICAL HISTORY: Past Medical History  Diagnosis Date  . GERD (gastroesophageal reflux disease)   . Asthma   . Hypertension   . Diabetes mellitus   . Thyroid disease   . Anxiety   . Vertigo   . Breast cancer   . COPD (chronic obstructive pulmonary disease)   . PONV (postoperative nausea and vomiting)     ALLERGIES:  is allergic to metformin and related.  MEDICATIONS:  Current Outpatient Prescriptions  Medication Sig Dispense Refill  . albuterol (ACCUNEB) 1.25 MG/3ML nebulizer solution Take 1 ampule by nebulization 2 (two) times daily as needed. For shortness of breath      . albuterol (PROVENTIL HFA;VENTOLIN HFA) 108 (90 BASE) MCG/ACT inhaler Inhale 2 puffs into the lungs every 6 (six) hours as needed. For shortness of breath      . ALPRAZolam (XANAX) 0.25 MG tablet Take 0.25 mg by mouth 3 (three) times daily as needed for anxiety.       . Alum Hydroxide-Mag Carbonate (GAVISCON PO) Take 1 tablet by mouth daily as needed (stomach).      Marland Kitchen aspirin EC 81 MG tablet Take 81 mg by mouth every morning.       . cetirizine (ZYRTEC) 10 MG tablet Take 10 mg by mouth daily as needed for allergies.      . Cholecalciferol (VITAMIN D) 2000 UNITS tablet Take 2,000 Units by mouth daily with lunch.       . dextromethorphan-guaiFENesin (MUCINEX DM) 30-600 MG per 12 hr  tablet Take 1 tablet by mouth every 12 (twelve) hours.      . Fluticasone-Salmeterol (ADVAIR) 250-50 MCG/DOSE AEPB Inhale 1 puff into the lungs every 12 (twelve) hours.      . fosinopril (MONOPRIL) 20 MG tablet Take 20 mg by mouth daily after breakfast.       . HYDROcodone-acetaminophen (NORCO/VICODIN) 5-325 MG per tablet Take 1 tablet by mouth every 6 (six) hours as needed for moderate pain.  40 tablet  0  . ibuprofen (ADVIL,MOTRIN) 200 MG tablet Take 200 mg by mouth every 8 (eight) hours as  needed for pain.      Marland Kitchen levothyroxine (SYNTHROID, LEVOTHROID) 50 MCG tablet Take 50 mcg by mouth daily before breakfast.       . lidocaine-prilocaine (EMLA) cream APPLY TO PORT 1 HOUR BEFORE CHEMOTHERAPY  30 g  0  . metoprolol tartrate (LOPRESSOR) 25 MG tablet Take 1 tablet (25 mg total) by mouth 2 (two) times daily.  60 tablet  1  . montelukast (SINGULAIR) 10 MG tablet Take 10 mg by mouth at bedtime.      . ondansetron (ZOFRAN-ODT) 8 MG disintegrating tablet Take 8 mg by mouth every 8 (eight) hours as needed for nausea.       . Polyvinyl Alcohol-Povidone (REFRESH OP) Place 2 drops into both eyes daily as needed (For watery eyes.).      Marland Kitchen PRESCRIPTION MEDICATION She receives her treatments at the [facility] with Dr. Julien Nordmann. She received a 3 day cycle from 07/19/13 to 07/21/13 of Etoposide 200mg  and then an injection of Neulasta 6mg  on 07/22/13.      Marland Kitchen prochlorperazine (COMPAZINE) 10 MG tablet Take 10 mg by mouth every 6 (six) hours as needed (For nausea.).       No current facility-administered medications for this visit.    SURGICAL HISTORY:  Past Surgical History  Procedure Laterality Date  . Cardiac catherization    . Breast lumpectomy    . Tubal ligation    . Video assisted thoracoscopy (vats)/wedge resection Left 11/19/2012    Procedure: VIDEO ASSISTED THORACOSCOPY (VATS)/WEDGE RESECTION;  Surgeon: Melrose Nakayama, MD;  Location: [place];  Service: Thoracic;  Laterality: Left;  left upper lobe wedge resection  . Lobectomy Left 11/19/2012    Procedure: LOBECTOMY;  Surgeon: Melrose Nakayama, MD;  Location: [place];  Service: Thoracic;  Laterality: Left;  left upper lobe  . Lymph node dissection Left 11/19/2012    Procedure: LYMPH NODE DISSECTION;  Surgeon: Melrose Nakayama, MD;  Location: [place];  Service: Thoracic;  Laterality: Left;    REVIEW OF SYSTEMS:  Constitutional: negative Eyes: negative Ears, nose, mouth, throat, and face: negative Respiratory: positive for  dyspnea on exertion Cardiovascular: negative Gastrointestinal: negative Genitourinary:negative Integument/breast: negative Hematologic/lymphatic: negative Musculoskeletal:negative Neurological: negative Behavioral/Psych: negative Endocrine: negative Allergic/Immunologic: negative   PHYSICAL EXAMINATION: General appearance: alert, cooperative and no distress Head: Normocephalic, without obvious abnormality, atraumatic Neck: no adenopathy, no JVD, supple, symmetrical, trachea midline and thyroid not enlarged, symmetric, no tenderness/mass/nodules Lymph nodes: Cervical, supraclavicular, and axillary nodes normal. Resp: clear to auscultation bilaterally Back: symmetric, no curvature. ROM normal. No CVA tenderness. Cardio: regular rate and rhythm, S1, S2 normal, no murmur, click, rub or gallop GI: soft, non-tender; bowel sounds normal; no masses,  no organomegaly Extremities: extremities normal, atraumatic, no cyanosis or edema Neurologic: Alert and oriented X 3, normal strength and tone. Normal symmetric reflexes. Normal coordination and gait   ECOG PERFORMANCE STATUS: 1 - Symptomatic but  completely ambulatory  Blood pressure 107/52, pulse 80, temperature 98.6 F (37 C), temperature source Oral, resp. rate 18, weight 128 lb 3.2 oz (58.151 kg), SpO2 98.00%.  LABORATORY DATA: Lab Results  Component Value Date   WBC 3.0* 12/22/2013   HGB 8.4* 12/22/2013   HCT 25.1* 12/22/2013   MCV 90.6 12/22/2013   PLT 101* 12/22/2013      Chemistry      Component Value Date/Time   NA 134* 12/08/2013 0949   NA 135 08/05/2013 1132   K 4.0 12/08/2013 0949   K 4.0 08/05/2013 1132   CL 101 08/05/2013 1132   CL 102 03/30/2013 0951   CO2 27 12/08/2013 0949   CO2 23 08/05/2013 1132   BUN 14.0 12/08/2013 0949   BUN 13 08/05/2013 1132   CREATININE 0.8 12/08/2013 0949   CREATININE 0.99 08/05/2013 1132      Component Value Date/Time   CALCIUM 9.4 12/08/2013 0949   CALCIUM 10.8* 08/05/2013 1132   ALKPHOS  97 12/08/2013 0949   ALKPHOS 65 11/21/2012 0445   AST 18 12/08/2013 0949   AST 15 11/21/2012 0445   ALT 14 12/08/2013 0949   ALT 16 11/21/2012 0445   BILITOT 0.38 12/08/2013 0949   BILITOT 0.4 11/21/2012 0445       RADIOGRAPHIC STUDIES:  ASSESSMENT AND PLAN: This is a very pleasant 69 years old white female recently diagnosed with neuroendocrine carcinoma, small cell with multiple liver metastases as well as left AP window lymphadenopathy, extrapleural nodal metastasis and left iliac bone lesion.  She has a history of neuroendocrine carcinoma, large cell type diagnosed as stage IIA in February of 2014 status post left upper lobectomy followed by 4 cycles of adjuvant chemotherapy with cisplatin and Alimta. She's currently being treated with systemic chemotherapy in the form of carboplatin for an AUC of 5 given on day 1 and etoposide at 120 mg router squared on days 1, 2 and 3 with Neulasta support given on day 4 every 3 weeks, status post 4 cycles.  Her recent CT scan of the chest, abdomen and pelvis showed evidence for disease progression and the patient was started on treatment with weekly topotecan 3 mg/M2 status post 5 cycles. We will proceed with weekly dose #6 today as scheduled. For the chemotherapy-induced anemia, I will arrange for the patient to receive 2 units of PRBCs transfusion in the next few days.  She was advised to call immediately if she has any concerning symptoms in the interval. The patient voices understanding of current disease status and treatment options and is in agreement with the current care plan.  All questions were answered. The patient knows to call the clinic with any problems, questions or concerns. We can certainly see the patient much sooner if necessary.  Disclaimer: This note was dictated with voice recognition software. Similar sounding words can inadvertently be transcribed and may not be corrected upon review.    Eilleen Kempf., MD 12/22/2013

## 2013-12-22 NOTE — Patient Instructions (Signed)
[facility] Discharge Instructions for Patients Receiving Chemotherapy  Today you received the following chemotherapy agents Hycamtin.  To help prevent nausea and vomiting after your treatment, we encourage you to take your nausea medication as prescribed by your MD.   If you develop nausea and vomiting that is not controlled by your nausea medication, call the clinic.   BELOW ARE SYMPTOMS THAT SHOULD BE REPORTED IMMEDIATELY:  *FEVER GREATER THAN 100.5 F  *CHILLS WITH OR WITHOUT FEVER  NAUSEA AND VOMITING THAT IS NOT CONTROLLED WITH YOUR NAUSEA MEDICATION  *UNUSUAL SHORTNESS OF BREATH  *UNUSUAL BRUISING OR BLEEDING  TENDERNESS IN MOUTH AND THROAT WITH OR WITHOUT PRESENCE OF ULCERS  *URINARY PROBLEMS  *BOWEL PROBLEMS  UNUSUAL RASH Items with * indicate a potential emergency and should be followed up as soon as possible.  Feel free to call the clinic you have any questions or concerns. The clinic phone number is [phone].

## 2013-12-22 NOTE — Progress Notes (Signed)
OK to treat per Dr. Julien Nordmann despite labs.

## 2013-12-22 NOTE — Telephone Encounter (Signed)
gv and printed appt sched adn avs for pt for March and April.....sed added tx....pt getting blue band in tx area

## 2013-12-24 ENCOUNTER — Ambulatory Visit (HOSPITAL_BASED_OUTPATIENT_CLINIC_OR_DEPARTMENT_OTHER): Payer: Medicare Other

## 2013-12-24 VITALS — BP 131/66 | HR 68 | Temp 98.1°F | Resp 17

## 2013-12-24 DIAGNOSIS — D649 Anemia, unspecified: Secondary | ICD-10-CM

## 2013-12-24 DIAGNOSIS — D630 Anemia in neoplastic disease: Secondary | ICD-10-CM

## 2013-12-24 DIAGNOSIS — C7A1 Malignant poorly differentiated neuroendocrine tumors: Secondary | ICD-10-CM

## 2013-12-24 MED ORDER — ACETAMINOPHEN 325 MG PO TABS
ORAL_TABLET | ORAL | Status: AC
  Filled 2013-12-24: qty 2

## 2013-12-24 MED ORDER — ACETAMINOPHEN 325 MG PO TABS
650.0000 mg | ORAL_TABLET | Freq: Once | ORAL | Status: AC
  Administered 2013-12-24: 650 mg via ORAL

## 2013-12-24 MED ORDER — HEPARIN SOD (PORK) LOCK FLUSH 100 UNIT/ML IV SOLN
500.0000 [IU] | Freq: Every day | INTRAVENOUS | Status: AC | PRN
  Administered 2013-12-24: 500 [IU]
  Filled 2013-12-24: qty 5

## 2013-12-24 MED ORDER — SODIUM CHLORIDE 0.9 % IJ SOLN
10.0000 mL | INTRAMUSCULAR | Status: AC | PRN
  Administered 2013-12-24: 10 mL
  Filled 2013-12-24: qty 10

## 2013-12-24 MED ORDER — DIPHENHYDRAMINE HCL 25 MG PO CAPS
25.0000 mg | ORAL_CAPSULE | Freq: Once | ORAL | Status: AC
  Administered 2013-12-24: 25 mg via ORAL

## 2013-12-24 MED ORDER — DIPHENHYDRAMINE HCL 25 MG PO CAPS
ORAL_CAPSULE | ORAL | Status: AC
  Filled 2013-12-24: qty 1

## 2013-12-24 MED ORDER — SODIUM CHLORIDE 0.9 % IV SOLN
250.0000 mL | Freq: Once | INTRAVENOUS | Status: AC
  Administered 2013-12-24: 250 mL via INTRAVENOUS

## 2013-12-24 NOTE — Patient Instructions (Signed)
Blood Transfusion Information WHAT IS A BLOOD TRANSFUSION? A transfusion is the replacement of blood or some of its parts. Blood is made up of multiple cells which provide different functions.  Red blood cells carry oxygen and are used for blood loss replacement.  White blood cells fight against infection.  Platelets control bleeding.  Plasma helps clot blood.  Other blood products are available for specialized needs, such as hemophilia or other clotting disorders. BEFORE THE TRANSFUSION  Who gives blood for transfusions?   You may be able to donate blood to be used at a later date on yourself (autologous donation).  Relatives can be asked to donate blood. This is generally not any safer than if you have received blood from a stranger. The same precautions are taken to ensure safety when a relative's blood is donated.  Healthy volunteers who are fully evaluated to make sure their blood is safe. This is blood bank blood. Transfusion therapy is the safest it has ever been in the practice of medicine. Before blood is taken from a donor, a complete history is taken to make sure that person has no history of diseases nor engages in risky social behavior (examples are intravenous drug use or sexual activity with multiple partners). The donor's travel history is screened to minimize risk of transmitting infections, such as malaria. The donated blood is tested for signs of infectious diseases, such as HIV and hepatitis. The blood is then tested to be sure it is compatible with you in order to minimize the chance of a transfusion reaction. If you or a relative donates blood, this is often done in anticipation of surgery and is not appropriate for emergency situations. It takes many days to process the donated blood. RISKS AND COMPLICATIONS Although transfusion therapy is very safe and saves many lives, the main dangers of transfusion include:   Getting an infectious disease.  Developing a  transfusion reaction. This is an allergic reaction to something in the blood you were given. Every precaution is taken to prevent this. The decision to have a blood transfusion has been considered carefully by your caregiver before blood is given. Blood is not given unless the benefits outweigh the risks. AFTER THE TRANSFUSION  Right after receiving a blood transfusion, you will usually feel much better and more energetic. This is especially true if your red blood cells have gotten low (anemic). The transfusion raises the level of the red blood cells which carry oxygen, and this usually causes an energy increase.  The nurse administering the transfusion will monitor you carefully for complications. HOME CARE INSTRUCTIONS  No special instructions are needed after a transfusion. You may find your energy is better. Speak with your caregiver about any limitations on activity for underlying diseases you may have. SEEK MEDICAL CARE IF:   Your condition is not improving after your transfusion.  You develop redness or irritation at the intravenous (IV) site. SEEK IMMEDIATE MEDICAL CARE IF:  Any of the following symptoms occur over the next 12 hours:  Shaking chills.  You have a temperature by mouth above 102 F (38.9 C), not controlled by medicine.  Chest, back, or muscle pain.  People around you feel you are not acting correctly or are confused.  Shortness of breath or difficulty breathing.  Dizziness and fainting.  You get a rash or develop hives.  You have a decrease in urine output.  Your urine turns a dark color or changes to pink, red, or brown. Any of the following   symptoms occur over the next 10 days:  You have a temperature by mouth above 102 F (38.9 C), not controlled by medicine.  Shortness of breath.  Weakness after normal activity.  The white part of the eye turns yellow (jaundice).  You have a decrease in the amount of urine or are urinating less often.  Your  urine turns a dark color or changes to pink, red, or brown. Document Released: 09/27/2000 Document Revised: 12/23/2011 Document Reviewed: 05/16/2008 [employer] Patient Information 2014 [employer].  

## 2013-12-25 LAB — TYPE AND SCREEN
ABO/RH(D): A POS
ANTIBODY SCREEN: NEGATIVE
Unit division: 0
Unit division: 0

## 2013-12-29 ENCOUNTER — Ambulatory Visit (HOSPITAL_BASED_OUTPATIENT_CLINIC_OR_DEPARTMENT_OTHER): Payer: Medicare Other

## 2013-12-29 ENCOUNTER — Other Ambulatory Visit (HOSPITAL_BASED_OUTPATIENT_CLINIC_OR_DEPARTMENT_OTHER): Payer: Medicare Other

## 2013-12-29 ENCOUNTER — Other Ambulatory Visit: Payer: Self-pay | Admitting: Internal Medicine

## 2013-12-29 VITALS — BP 147/62 | HR 75 | Temp 98.4°F

## 2013-12-29 DIAGNOSIS — Z5111 Encounter for antineoplastic chemotherapy: Secondary | ICD-10-CM

## 2013-12-29 DIAGNOSIS — C7A1 Malignant poorly differentiated neuroendocrine tumors: Secondary | ICD-10-CM

## 2013-12-29 DIAGNOSIS — C3490 Malignant neoplasm of unspecified part of unspecified bronchus or lung: Secondary | ICD-10-CM

## 2013-12-29 DIAGNOSIS — C3410 Malignant neoplasm of upper lobe, unspecified bronchus or lung: Secondary | ICD-10-CM

## 2013-12-29 DIAGNOSIS — C787 Secondary malignant neoplasm of liver and intrahepatic bile duct: Secondary | ICD-10-CM

## 2013-12-29 LAB — CBC WITH DIFFERENTIAL/PLATELET
BASO%: 0.7 % (ref 0.0–2.0)
Basophils Absolute: 0 10*3/uL (ref 0.0–0.1)
EOS ABS: 0 10*3/uL (ref 0.0–0.5)
EOS%: 1.1 % (ref 0.0–7.0)
HCT: 33.1 % — ABNORMAL LOW (ref 34.8–46.6)
HGB: 11.6 g/dL (ref 11.6–15.9)
LYMPH%: 44.8 % (ref 14.0–49.7)
MCH: 30.9 pg (ref 25.1–34.0)
MCHC: 35 g/dL (ref 31.5–36.0)
MCV: 88 fL (ref 79.5–101.0)
MONO#: 0.3 10*3/uL (ref 0.1–0.9)
MONO%: 12.3 % (ref 0.0–14.0)
NEUT%: 41.1 % (ref 38.4–76.8)
NEUTROS ABS: 1.1 10*3/uL — AB (ref 1.5–6.5)
NRBC: 0 % (ref 0–0)
PLATELETS: 81 10*3/uL — AB (ref 145–400)
RBC: 3.76 10*6/uL (ref 3.70–5.45)
RDW: 14.7 % — ABNORMAL HIGH (ref 11.2–14.5)
WBC: 2.7 10*3/uL — ABNORMAL LOW (ref 3.9–10.3)
lymph#: 1.2 10*3/uL (ref 0.9–3.3)

## 2013-12-29 LAB — COMPREHENSIVE METABOLIC PANEL (CC13)
ALBUMIN: 2.9 g/dL — AB (ref 3.5–5.0)
ALT: 19 U/L (ref 0–55)
ANION GAP: 8 meq/L (ref 3–11)
AST: 22 U/L (ref 5–34)
Alkaline Phosphatase: 100 U/L (ref 40–150)
BUN: 13.2 mg/dL (ref 7.0–26.0)
CALCIUM: 9.5 mg/dL (ref 8.4–10.4)
CHLORIDE: 104 meq/L (ref 98–109)
CO2: 23 meq/L (ref 22–29)
CREATININE: 0.8 mg/dL (ref 0.6–1.1)
Glucose: 145 mg/dl — ABNORMAL HIGH (ref 70–140)
POTASSIUM: 3.7 meq/L (ref 3.5–5.1)
Sodium: 134 mEq/L — ABNORMAL LOW (ref 136–145)
Total Bilirubin: 0.48 mg/dL (ref 0.20–1.20)
Total Protein: 6.1 g/dL — ABNORMAL LOW (ref 6.4–8.3)

## 2013-12-29 MED ORDER — ONDANSETRON 8 MG/NS 50 ML IVPB
INTRAVENOUS | Status: AC
  Filled 2013-12-29: qty 8

## 2013-12-29 MED ORDER — DEXAMETHASONE SODIUM PHOSPHATE 10 MG/ML IJ SOLN
10.0000 mg | Freq: Once | INTRAMUSCULAR | Status: AC
  Administered 2013-12-29: 10 mg via INTRAVENOUS

## 2013-12-29 MED ORDER — SODIUM CHLORIDE 0.9 % IV SOLN
Freq: Once | INTRAVENOUS | Status: AC
  Administered 2013-12-29: 12:00:00 via INTRAVENOUS

## 2013-12-29 MED ORDER — SODIUM CHLORIDE 0.9 % IJ SOLN
10.0000 mL | INTRAMUSCULAR | Status: DC | PRN
  Administered 2013-12-29: 10 mL
  Filled 2013-12-29: qty 10

## 2013-12-29 MED ORDER — HEPARIN SOD (PORK) LOCK FLUSH 100 UNIT/ML IV SOLN
500.0000 [IU] | Freq: Once | INTRAVENOUS | Status: AC | PRN
  Administered 2013-12-29: 500 [IU]
  Filled 2013-12-29: qty 5

## 2013-12-29 MED ORDER — DEXAMETHASONE SODIUM PHOSPHATE 10 MG/ML IJ SOLN
INTRAMUSCULAR | Status: AC
  Filled 2013-12-29: qty 1

## 2013-12-29 MED ORDER — TOPOTECAN HCL CHEMO INJECTION 4 MG
2.0000 mg/m2 | Freq: Once | INTRAVENOUS | Status: AC
  Administered 2013-12-29: 3.3 mg via INTRAVENOUS
  Filled 2013-12-29: qty 3.3

## 2013-12-29 MED ORDER — ONDANSETRON 8 MG/50ML IVPB (CHCC)
8.0000 mg | Freq: Once | INTRAVENOUS | Status: AC
  Administered 2013-12-29: 8 mg via INTRAVENOUS

## 2013-12-29 NOTE — Patient Instructions (Signed)
[facility] Discharge Instructions for Patients Receiving Chemotherapy  Today you received the following chemotherapy agents: Topetecan  To help prevent nausea and vomiting after your treatment, we encourage you to take your nausea medication as prescribed.  If you develop nausea and vomiting that is not controlled by your nausea medication, call the clinic.   BELOW ARE SYMPTOMS THAT SHOULD BE REPORTED IMMEDIATELY:  *FEVER GREATER THAN 100.5 F  *CHILLS WITH OR WITHOUT FEVER  NAUSEA AND VOMITING THAT IS NOT CONTROLLED WITH YOUR NAUSEA MEDICATION  *UNUSUAL SHORTNESS OF BREATH  *UNUSUAL BRUISING OR BLEEDING  TENDERNESS IN MOUTH AND THROAT WITH OR WITHOUT PRESENCE OF ULCERS  *URINARY PROBLEMS  *BOWEL PROBLEMS  UNUSUAL RASH Items with * indicate a potential emergency and should be followed up as soon as possible.  Feel free to call the clinic you have any questions or concerns. The clinic phone number is [phone].

## 2013-12-29 NOTE — Progress Notes (Signed)
CBC reviewed by Dr. Julien Nordmann. Order received to treat despite counts, Topotecan dose reduced.

## 2014-01-05 ENCOUNTER — Other Ambulatory Visit (HOSPITAL_BASED_OUTPATIENT_CLINIC_OR_DEPARTMENT_OTHER): Payer: Medicare Other

## 2014-01-05 ENCOUNTER — Ambulatory Visit: Payer: Medicare Other

## 2014-01-05 ENCOUNTER — Other Ambulatory Visit: Payer: Medicare Other

## 2014-01-05 ENCOUNTER — Encounter: Payer: Self-pay | Admitting: Physician Assistant

## 2014-01-05 ENCOUNTER — Telehealth: Payer: Self-pay | Admitting: Internal Medicine

## 2014-01-05 ENCOUNTER — Ambulatory Visit (HOSPITAL_BASED_OUTPATIENT_CLINIC_OR_DEPARTMENT_OTHER): Payer: Medicare Other | Admitting: Physician Assistant

## 2014-01-05 VITALS — BP 147/59 | HR 68 | Temp 98.1°F | Resp 17 | Ht 67.0 in | Wt 125.6 lb

## 2014-01-05 DIAGNOSIS — T451X5A Adverse effect of antineoplastic and immunosuppressive drugs, initial encounter: Secondary | ICD-10-CM

## 2014-01-05 DIAGNOSIS — C3490 Malignant neoplasm of unspecified part of unspecified bronchus or lung: Secondary | ICD-10-CM

## 2014-01-05 DIAGNOSIS — D6481 Anemia due to antineoplastic chemotherapy: Secondary | ICD-10-CM

## 2014-01-05 DIAGNOSIS — D6959 Other secondary thrombocytopenia: Secondary | ICD-10-CM

## 2014-01-05 DIAGNOSIS — C7A1 Malignant poorly differentiated neuroendocrine tumors: Secondary | ICD-10-CM

## 2014-01-05 DIAGNOSIS — C787 Secondary malignant neoplasm of liver and intrahepatic bile duct: Secondary | ICD-10-CM

## 2014-01-05 DIAGNOSIS — C7A090 Malignant carcinoid tumor of the bronchus and lung: Secondary | ICD-10-CM

## 2014-01-05 LAB — CBC WITH DIFFERENTIAL/PLATELET
BASO%: 1.2 % (ref 0.0–2.0)
Basophils Absolute: 0 10*3/uL (ref 0.0–0.1)
EOS%: 0.9 % (ref 0.0–7.0)
Eosinophils Absolute: 0 10*3/uL (ref 0.0–0.5)
HCT: 31.7 % — ABNORMAL LOW (ref 34.8–46.6)
HGB: 11.3 g/dL — ABNORMAL LOW (ref 11.6–15.9)
LYMPH#: 1.6 10*3/uL (ref 0.9–3.3)
LYMPH%: 45 % (ref 14.0–49.7)
MCH: 30.9 pg (ref 25.1–34.0)
MCHC: 35.6 g/dL (ref 31.5–36.0)
MCV: 86.6 fL (ref 79.5–101.0)
MONO#: 0.4 10*3/uL (ref 0.1–0.9)
MONO%: 12.4 % (ref 0.0–14.0)
NEUT#: 1.4 10*3/uL — ABNORMAL LOW (ref 1.5–6.5)
NEUT%: 40.5 % (ref 38.4–76.8)
NRBC: 1 % — AB (ref 0–0)
Platelets: 41 10*3/uL — ABNORMAL LOW (ref 145–400)
RBC: 3.66 10*6/uL — AB (ref 3.70–5.45)
RDW: 14.8 % — ABNORMAL HIGH (ref 11.2–14.5)
WBC: 3.5 10*3/uL — AB (ref 3.9–10.3)

## 2014-01-05 NOTE — Telephone Encounter (Signed)
gv adn printed apt sched and avs for pt for April....sed added tx....gv pt barium

## 2014-01-05 NOTE — Progress Notes (Addendum)
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   PRIOR THERAPY:   1) Status post left upper lobectomy with lymph node dissection.  2) Adjuvant systemic chemotherapy with cisplatin 75 mg region squared and Alimta 500 mg per meter squared given every 3 weeks for total of 4 cycles, status post 4 cycles. Last dose was given 03/02/2013.  3)  Systemic chemotherapy with carboplatin for AUC of 5 on day 1 and etoposide at 120 mg/M2 on days 1, 2 and 3 with Neulasta support on day 4 every 3 weeks. First dose of chemotherapy on 07/19/2013. Status post 4 cycles. Last cycle was on 09/20/2013 discontinued secondary to disease progression.   CURRENT THERAPY: Topotecan 3 mg/M2 on a weekly basis, status post 6 cycles. First dose 11/03/2013  CHEMOTHERAPY INTENT: Palliative  CURRENT # OF CHEMOTHERAPY CYCLES: 6  CURRENT ANTIEMETICS: Zofran, dexamethasone and Compazine.  CURRENT SMOKING STATUS: Former smoker  ORAL CHEMOTHERAPY AND CONSENT: None  CURRENT BISPHOSPHONATES USE: None  PAIN MANAGEMENT: 0/10  NARCOTICS INDUCED CONSTIPATION: None  LIVING WILL AND CODE STATUS: ?    INTERVAL HISTORY: Sara Mcclure 68 y.o. female returns to the clinic today for followup visit accompanied by her husband. The patient missed her treatment last week secondary to thrombocytopenia. She continues to complain of increasing fatigue and weakness. She also complains of back pain and has been taking for Advil tablets daily. She states full tablet of hydrocodone causes her  nausea and vomiting. She is able to tolerate a half a tablet at a time without the nausea and vomiting. She reports occasional blood-tinged nasal secretions but denies any frank bleeding or bruising. She denied having any significant chest pain, but has shortness of breath with exertion, no cough or hemoptysis.  She has no weight loss or night sweats. The patient denied having any current nausea or vomiting. She is here today to resume her weekly treatment with topotecan.  MEDICAL HISTORY: Past Medical History  Diagnosis Date  . GERD (gastroesophageal reflux disease)   . Asthma   . Hypertension   . Diabetes mellitus   . Thyroid disease   . Anxiety   . Vertigo   . Breast cancer   . COPD (chronic obstructive pulmonary disease)   . PONV (postoperative nausea and vomiting)     ALLERGIES:  is allergic to metformin and related.  MEDICATIONS:  Current Outpatient Prescriptions  Medication Sig Dispense Refill  . aspirin EC 81 MG tablet Take 81 mg by mouth every morning.       . Cholecalciferol (VITAMIN D) 2000 UNITS tablet Take 2,000 Units by mouth daily with lunch.       . dextromethorphan-guaiFENesin (MUCINEX DM) 30-600 MG per 12 hr tablet Take 1 tablet by mouth every 12 (twelve) hours.      . Fluticasone-Salmeterol (ADVAIR) 250-50 MCG/DOSE AEPB Inhale 1 puff into the lungs every 12 (twelve) hours.      . fosinopril (MONOPRIL) 20 MG tablet Take 20 mg by mouth daily after breakfast.       . ibuprofen (ADVIL,MOTRIN) 200 MG tablet Take 200 mg by mouth every  8 (eight) hours as needed for pain.      Marland Kitchen levothyroxine (SYNTHROID, LEVOTHROID) 50 MCG tablet Take 50 mcg by mouth daily before breakfast.       . lidocaine-prilocaine (EMLA) cream APPLY TO PORT 1 HOUR BEFORE CHEMOTHERAPY  30 g  0  . metoprolol tartrate (LOPRESSOR) 25 MG tablet Take 1 tablet (25 mg total) by mouth 2 (two) times daily.  60 tablet  1  . montelukast (SINGULAIR) 10 MG tablet Take 10 mg by mouth at bedtime.      Marland Kitchen PRESCRIPTION  MEDICATION She receives her treatments at the [facility] with Dr. Julien Nordmann. She received a 3 day cycle from 07/19/13 to 07/21/13 of Etoposide 200mg  and then an injection of Neulasta 6mg  on 07/22/13.      Marland Kitchen prochlorperazine (COMPAZINE) 10 MG tablet Take 10 mg by mouth every 6 (six) hours as needed (For nausea.).      Marland Kitchen albuterol (ACCUNEB) 1.25 MG/3ML nebulizer solution Take 1 ampule by nebulization 2 (two) times daily as needed. For shortness of breath      . albuterol (PROVENTIL HFA;VENTOLIN HFA) 108 (90 BASE) MCG/ACT inhaler Inhale 2 puffs into the lungs every 6 (six) hours as needed. For shortness of breath      . ALPRAZolam (XANAX) 0.25 MG tablet Take 0.25 mg by mouth 3 (three) times daily as needed for anxiety.       . Alum Hydroxide-Mag Carbonate (GAVISCON PO) Take 1 tablet by mouth daily as needed (stomach).      . cetirizine (ZYRTEC) 10 MG tablet Take 10 mg by mouth daily as needed for allergies.      Marland Kitchen HYDROcodone-acetaminophen (NORCO/VICODIN) 5-325 MG per tablet Take 1 tablet by mouth every 6 (six) hours as needed for moderate pain.  40 tablet  0  . ondansetron (ZOFRAN-ODT) 8 MG disintegrating tablet Take 8 mg by mouth every 8 (eight) hours as needed for nausea.       . Polyvinyl Alcohol-Povidone (REFRESH OP) Place 2 drops into both eyes daily as needed (For watery eyes.).       No current facility-administered medications for this visit.    SURGICAL HISTORY:  Past Surgical History  Procedure Laterality Date  . Cardiac catherization    . Breast lumpectomy    . Tubal ligation    . Video assisted thoracoscopy (vats)/wedge resection Left 11/19/2012    Procedure: VIDEO ASSISTED THORACOSCOPY (VATS)/WEDGE RESECTION;  Surgeon: Melrose Nakayama, MD;  Location: [place];  Service: Thoracic;  Laterality: Left;  left upper lobe wedge resection  . Lobectomy Left 11/19/2012    Procedure: LOBECTOMY;  Surgeon: Melrose Nakayama, MD;  Location: [place];  Service: Thoracic;  Laterality: Left;   left upper lobe  . Lymph node dissection Left 11/19/2012    Procedure: LYMPH NODE DISSECTION;  Surgeon: Melrose Nakayama, MD;  Location: [place];  Service: Thoracic;  Laterality: Left;    REVIEW OF SYSTEMS:  Constitutional: positive for fatigue and malaise Eyes: negative Ears, nose, mouth, throat, and face: positive for Occasional blood-tinged nasal secretions Respiratory: positive for dyspnea on exertion Cardiovascular: negative Gastrointestinal: negative Genitourinary:negative Integument/breast: negative Hematologic/lymphatic: negative Musculoskeletal:positive for back pain Neurological: negative Behavioral/Psych: negative Endocrine: negative Allergic/Immunologic: negative   PHYSICAL EXAMINATION: General appearance: alert, cooperative and no distress Head: Normocephalic, without obvious abnormality, atraumatic Neck: no adenopathy, no JVD, supple, symmetrical, trachea midline and thyroid not enlarged, symmetric, no tenderness/mass/nodules Lymph nodes: Cervical, supraclavicular, and axillary nodes normal. Resp: clear to auscultation bilaterally  Back: symmetric, no curvature. ROM normal. No CVA tenderness. Cardio: regular rate and rhythm, S1, S2 normal, no murmur, click, rub or gallop GI: soft, non-tender; bowel sounds normal; no masses,  no organomegaly Extremities: extremities normal, atraumatic, no cyanosis or edema Neurologic: Alert and oriented X 3, normal strength and tone. Normal symmetric reflexes. Normal coordination and gait   ECOG PERFORMANCE STATUS: 1 - Symptomatic but completely ambulatory  Blood pressure 147/59, pulse 68, temperature 98.1 F (36.7 C), temperature source Oral, resp. rate 17, height 5\' 7"  (1.702 m), weight 125 lb 9.6 oz (56.972 kg), SpO2 100.00%.  LABORATORY DATA: Lab Results  Component Value Date   WBC 3.5* 01/05/2014   HGB 11.3* 01/05/2014   HCT 31.7* 01/05/2014   MCV 86.6 01/05/2014   PLT 41* 01/05/2014      Chemistry      Component Value  Date/Time   NA 134* 12/29/2013 1045   NA 135 08/05/2013 1132   K 3.7 12/29/2013 1045   K 4.0 08/05/2013 1132   CL 101 08/05/2013 1132   CL 102 03/30/2013 0951   CO2 23 12/29/2013 1045   CO2 23 08/05/2013 1132   BUN 13.2 12/29/2013 1045   BUN 13 08/05/2013 1132   CREATININE 0.8 12/29/2013 1045   CREATININE 0.99 08/05/2013 1132      Component Value Date/Time   CALCIUM 9.5 12/29/2013 1045   CALCIUM 10.8* 08/05/2013 1132   ALKPHOS 100 12/29/2013 1045   ALKPHOS 65 11/21/2012 0445   AST 22 12/29/2013 1045   AST 15 11/21/2012 0445   ALT 19 12/29/2013 1045   ALT 16 11/21/2012 0445   BILITOT 0.48 12/29/2013 1045   BILITOT 0.4 11/21/2012 0445       RADIOGRAPHIC STUDIES:  ASSESSMENT AND PLAN: This is a very pleasant 68 years old white female recently diagnosed with neuroendocrine carcinoma, small cell with multiple liver metastases as well as left AP window lymphadenopathy, extrapleural nodal metastasis and left iliac bone lesion.  She has a history of neuroendocrine carcinoma, large cell type diagnosed as stage IIA in February of 2014 status post left upper lobectomy followed by 4 cycles of adjuvant chemotherapy with cisplatin and Alimta. She's currently being treated with systemic chemotherapy in the form of carboplatin for an AUC of 5 given on day 1 and etoposide at 120 mg router squared on days 1, 2 and 3 with Neulasta support given on day 4 every 3 weeks, status post 4 cycles.  Her recent CT scan of the chest, abdomen and pelvis showed evidence for disease progression and the patient was started on treatment with weekly topotecan 3 mg/M2 status post 6 cycles. Patient was discussed with and also seen by Dr. Julien Nordmann. Unfortunately her platelet count is still low at 41,000. Patient was asked to discontinue the use of ibuprofen and similar nonsteroidal anti-inflammatory drugs as these may be having a negative effect on her platelet count. Patient voiced understanding and we'll resume taking her half tablet of  hydrocodone every 4-6 hours as needed. We will not proceed with chemotherapy today due to thrombocytopenia. She will return in one week for repeat labs and hopefully proceed with her weekly to petechia and as scheduled. We'll schedule restaging CT scan of the chest, abdomen and pelvis in the next 2-3 weeks and have her followup with Dr. Julien Nordmann to discuss the results of the restaging CT scan in 3 weeks.   She was advised to call immediately if she has any concerning symptoms in the interval. The patient  voices understanding of current disease status and treatment options and is in agreement with the current care plan.  All questions were answered. The patient knows to call the clinic with any problems, questions or concerns. We can certainly see the patient much sooner if necessary.  Carlton Adam PA-C  ADDENDUM:  Hematology/Oncology Attending:  I had a face to face encounter with the patient. I recommended her care plan. This is a very pleasant 68 years old white female with progressive small cell lung cancer currently undergoing second line chemotherapy with single agent topotecan status post 6 cycles. The patient is rating her treatment well except for fatigue secondary to chemotherapy-induced anemia and thrombocytopenia. Her platelets count are low today.  We will hold her chemotherapy for today. The patient would come back for follow up visit in one week for evaluation before starting weekly dose #7.  she would have repeat CT scan of the chest, abdomen and pelvis in 3 weeks for reevaluation of her disease. She was advised to call immediately if she has any concerning symptoms in the interval. Disclaimer: This note was dictated with voice recognition software. Similar sounding words can inadvertently be transcribed and may not be corrected upon review. Eilleen Kempf., MD 01/08/2014

## 2014-01-06 NOTE — Patient Instructions (Signed)
Chemotherapy has been postponed by 1 week due to your low platelet count. Discontinue the use of Advil and similar drugs as these are likely having a negative effect on your platelet count Return in one week for repeat labs in 2 resume chemotherapy if her counts are within treatable range Followup with Dr. Julien Nordmann in 3 weeks with restaging CT scan of your chest, abdomen and pelvis to reevaluate your disease

## 2014-01-12 ENCOUNTER — Other Ambulatory Visit (HOSPITAL_BASED_OUTPATIENT_CLINIC_OR_DEPARTMENT_OTHER): Payer: Medicare Other

## 2014-01-12 ENCOUNTER — Ambulatory Visit (HOSPITAL_BASED_OUTPATIENT_CLINIC_OR_DEPARTMENT_OTHER): Payer: Medicare Other

## 2014-01-12 ENCOUNTER — Other Ambulatory Visit: Payer: Self-pay | Admitting: Internal Medicine

## 2014-01-12 VITALS — BP 107/70 | HR 80 | Temp 97.9°F | Resp 20

## 2014-01-12 DIAGNOSIS — Z5111 Encounter for antineoplastic chemotherapy: Secondary | ICD-10-CM

## 2014-01-12 DIAGNOSIS — C3490 Malignant neoplasm of unspecified part of unspecified bronchus or lung: Secondary | ICD-10-CM

## 2014-01-12 DIAGNOSIS — C787 Secondary malignant neoplasm of liver and intrahepatic bile duct: Secondary | ICD-10-CM

## 2014-01-12 DIAGNOSIS — C7A090 Malignant carcinoid tumor of the bronchus and lung: Secondary | ICD-10-CM

## 2014-01-12 DIAGNOSIS — C3410 Malignant neoplasm of upper lobe, unspecified bronchus or lung: Secondary | ICD-10-CM

## 2014-01-12 DIAGNOSIS — C7B8 Other secondary neuroendocrine tumors: Secondary | ICD-10-CM

## 2014-01-12 LAB — CBC WITH DIFFERENTIAL/PLATELET
BASO%: 0.2 % (ref 0.0–2.0)
BASOS ABS: 0 10*3/uL (ref 0.0–0.1)
EOS ABS: 0 10*3/uL (ref 0.0–0.5)
EOS%: 0.7 % (ref 0.0–7.0)
HCT: 28.1 % — ABNORMAL LOW (ref 34.8–46.6)
HEMOGLOBIN: 9.7 g/dL — AB (ref 11.6–15.9)
LYMPH%: 44.8 % (ref 14.0–49.7)
MCH: 30.5 pg (ref 25.1–34.0)
MCHC: 34.5 g/dL (ref 31.5–36.0)
MCV: 88.4 fL (ref 79.5–101.0)
MONO#: 0.6 10*3/uL (ref 0.1–0.9)
MONO%: 13.7 % (ref 0.0–14.0)
NEUT%: 40.6 % (ref 38.4–76.8)
NEUTROS ABS: 1.7 10*3/uL (ref 1.5–6.5)
Platelets: 108 10*3/uL — ABNORMAL LOW (ref 145–400)
RBC: 3.18 10*6/uL — ABNORMAL LOW (ref 3.70–5.45)
RDW: 16.1 % — AB (ref 11.2–14.5)
WBC: 4.2 10*3/uL (ref 3.9–10.3)
lymph#: 1.9 10*3/uL (ref 0.9–3.3)
nRBC: 0 % (ref 0–0)

## 2014-01-12 LAB — COMPREHENSIVE METABOLIC PANEL (CC13)
ALBUMIN: 3 g/dL — AB (ref 3.5–5.0)
ALT: 27 U/L (ref 0–55)
ANION GAP: 7 meq/L (ref 3–11)
AST: 30 U/L (ref 5–34)
Alkaline Phosphatase: 115 U/L (ref 40–150)
BUN: 10.9 mg/dL (ref 7.0–26.0)
CO2: 23 meq/L (ref 22–29)
Calcium: 9.3 mg/dL (ref 8.4–10.4)
Chloride: 103 mEq/L (ref 98–109)
Creatinine: 0.9 mg/dL (ref 0.6–1.1)
GLUCOSE: 143 mg/dL — AB (ref 70–140)
Potassium: 3.8 mEq/L (ref 3.5–5.1)
SODIUM: 133 meq/L — AB (ref 136–145)
Total Bilirubin: 0.39 mg/dL (ref 0.20–1.20)
Total Protein: 6.2 g/dL — ABNORMAL LOW (ref 6.4–8.3)

## 2014-01-12 MED ORDER — SODIUM CHLORIDE 0.9 % IV SOLN
Freq: Once | INTRAVENOUS | Status: AC
  Administered 2014-01-12: 11:00:00 via INTRAVENOUS

## 2014-01-12 MED ORDER — SODIUM CHLORIDE 0.9 % IJ SOLN
10.0000 mL | INTRAMUSCULAR | Status: DC | PRN
  Administered 2014-01-12: 10 mL
  Filled 2014-01-12: qty 10

## 2014-01-12 MED ORDER — DEXAMETHASONE SODIUM PHOSPHATE 10 MG/ML IJ SOLN
10.0000 mg | Freq: Once | INTRAMUSCULAR | Status: AC
  Administered 2014-01-12: 10 mg via INTRAVENOUS

## 2014-01-12 MED ORDER — HEPARIN SOD (PORK) LOCK FLUSH 100 UNIT/ML IV SOLN
500.0000 [IU] | Freq: Once | INTRAVENOUS | Status: AC | PRN
  Administered 2014-01-12: 500 [IU]
  Filled 2014-01-12: qty 5

## 2014-01-12 MED ORDER — SODIUM CHLORIDE 0.9 % IV SOLN
2.0000 mg/m2 | Freq: Once | INTRAVENOUS | Status: AC
  Administered 2014-01-12: 3.3 mg via INTRAVENOUS
  Filled 2014-01-12: qty 3.3

## 2014-01-12 MED ORDER — ONDANSETRON 8 MG/50ML IVPB (CHCC)
8.0000 mg | Freq: Once | INTRAVENOUS | Status: AC
  Administered 2014-01-12: 8 mg via INTRAVENOUS

## 2014-01-12 MED ORDER — ONDANSETRON 8 MG/NS 50 ML IVPB
INTRAVENOUS | Status: AC
  Filled 2014-01-12: qty 8

## 2014-01-12 MED ORDER — DEXAMETHASONE SODIUM PHOSPHATE 10 MG/ML IJ SOLN
INTRAMUSCULAR | Status: AC
  Filled 2014-01-12: qty 1

## 2014-01-12 NOTE — Patient Instructions (Signed)
[facility] Discharge Instructions for Patients Receiving Chemotherapy  Today you received the following chemotherapy agents topotecan  To help prevent nausea and vomiting after your treatment, we encourage you to take your nausea medication if needed may start about 6 pm if needed.   If you develop nausea and vomiting that is not controlled by your nausea medication, call the clinic.   BELOW ARE SYMPTOMS THAT SHOULD BE REPORTED IMMEDIATELY:  *FEVER GREATER THAN 100.5 F  *CHILLS WITH OR WITHOUT FEVER  NAUSEA AND VOMITING THAT IS NOT CONTROLLED WITH YOUR NAUSEA MEDICATION  *UNUSUAL SHORTNESS OF BREATH  *UNUSUAL BRUISING OR BLEEDING  TENDERNESS IN MOUTH AND THROAT WITH OR WITHOUT PRESENCE OF ULCERS  *URINARY PROBLEMS  *BOWEL PROBLEMS  UNUSUAL RASH Items with * indicate a potential emergency and should be followed up as soon as possible.  Feel free to call the clinic you have any questions or concerns. The clinic phone number is [phone].

## 2014-01-19 ENCOUNTER — Other Ambulatory Visit: Payer: Self-pay | Admitting: Internal Medicine

## 2014-01-19 ENCOUNTER — Ambulatory Visit (HOSPITAL_BASED_OUTPATIENT_CLINIC_OR_DEPARTMENT_OTHER): Payer: Medicare Other

## 2014-01-19 ENCOUNTER — Other Ambulatory Visit: Payer: Self-pay

## 2014-01-19 ENCOUNTER — Other Ambulatory Visit (HOSPITAL_BASED_OUTPATIENT_CLINIC_OR_DEPARTMENT_OTHER): Payer: Medicare Other

## 2014-01-19 VITALS — BP 109/74 | HR 80 | Temp 98.1°F

## 2014-01-19 DIAGNOSIS — C7A090 Malignant carcinoid tumor of the bronchus and lung: Secondary | ICD-10-CM

## 2014-01-19 DIAGNOSIS — Z5111 Encounter for antineoplastic chemotherapy: Secondary | ICD-10-CM

## 2014-01-19 DIAGNOSIS — C787 Secondary malignant neoplasm of liver and intrahepatic bile duct: Secondary | ICD-10-CM

## 2014-01-19 DIAGNOSIS — C3490 Malignant neoplasm of unspecified part of unspecified bronchus or lung: Secondary | ICD-10-CM

## 2014-01-19 DIAGNOSIS — C3410 Malignant neoplasm of upper lobe, unspecified bronchus or lung: Secondary | ICD-10-CM

## 2014-01-19 LAB — CBC WITH DIFFERENTIAL/PLATELET
BASO%: 0.8 % (ref 0.0–2.0)
Basophils Absolute: 0 10*3/uL (ref 0.0–0.1)
EOS%: 0.7 % (ref 0.0–7.0)
Eosinophils Absolute: 0 10*3/uL (ref 0.0–0.5)
HCT: 26.4 % — ABNORMAL LOW (ref 34.8–46.6)
HGB: 9.2 g/dL — ABNORMAL LOW (ref 11.6–15.9)
LYMPH#: 1.7 10*3/uL (ref 0.9–3.3)
LYMPH%: 39.9 % (ref 14.0–49.7)
MCH: 31.6 pg (ref 25.1–34.0)
MCHC: 35 g/dL (ref 31.5–36.0)
MCV: 90.3 fL (ref 79.5–101.0)
MONO#: 0.6 10*3/uL (ref 0.1–0.9)
MONO%: 13.8 % (ref 0.0–14.0)
NEUT%: 44.8 % (ref 38.4–76.8)
NEUTROS ABS: 1.9 10*3/uL (ref 1.5–6.5)
Platelets: 135 10*3/uL — ABNORMAL LOW (ref 145–400)
RBC: 2.93 10*6/uL — AB (ref 3.70–5.45)
RDW: 17.4 % — AB (ref 11.2–14.5)
WBC: 4.3 10*3/uL (ref 3.9–10.3)

## 2014-01-19 LAB — COMPREHENSIVE METABOLIC PANEL (CC13)
ALBUMIN: 3.1 g/dL — AB (ref 3.5–5.0)
ALT: 20 U/L (ref 0–55)
AST: 27 U/L (ref 5–34)
Alkaline Phosphatase: 122 U/L (ref 40–150)
Anion Gap: 8 mEq/L (ref 3–11)
BUN: 11.9 mg/dL (ref 7.0–26.0)
CHLORIDE: 101 meq/L (ref 98–109)
CO2: 24 mEq/L (ref 22–29)
Calcium: 9.6 mg/dL (ref 8.4–10.4)
Creatinine: 0.9 mg/dL (ref 0.6–1.1)
Glucose: 140 mg/dl (ref 70–140)
POTASSIUM: 3.7 meq/L (ref 3.5–5.1)
Sodium: 133 mEq/L — ABNORMAL LOW (ref 136–145)
Total Bilirubin: 0.51 mg/dL (ref 0.20–1.20)
Total Protein: 6.4 g/dL (ref 6.4–8.3)

## 2014-01-19 MED ORDER — SODIUM CHLORIDE 0.9 % IJ SOLN
10.0000 mL | INTRAMUSCULAR | Status: DC | PRN
  Administered 2014-01-19: 10 mL
  Filled 2014-01-19: qty 10

## 2014-01-19 MED ORDER — DEXAMETHASONE SODIUM PHOSPHATE 10 MG/ML IJ SOLN
INTRAMUSCULAR | Status: AC
  Filled 2014-01-19: qty 1

## 2014-01-19 MED ORDER — SODIUM CHLORIDE 0.9 % IV SOLN
Freq: Once | INTRAVENOUS | Status: AC
  Administered 2014-01-19: 12:00:00 via INTRAVENOUS

## 2014-01-19 MED ORDER — HEPARIN SOD (PORK) LOCK FLUSH 100 UNIT/ML IV SOLN
500.0000 [IU] | Freq: Once | INTRAVENOUS | Status: AC | PRN
  Administered 2014-01-19: 500 [IU]
  Filled 2014-01-19: qty 5

## 2014-01-19 MED ORDER — ONDANSETRON 8 MG/50ML IVPB (CHCC)
8.0000 mg | Freq: Once | INTRAVENOUS | Status: AC
  Administered 2014-01-19: 8 mg via INTRAVENOUS

## 2014-01-19 MED ORDER — TOPOTECAN HCL CHEMO INJECTION 4 MG
2.0000 mg/m2 | Freq: Once | INTRAVENOUS | Status: AC
  Administered 2014-01-19: 3.3 mg via INTRAVENOUS
  Filled 2014-01-19: qty 3.3

## 2014-01-19 MED ORDER — DEXAMETHASONE SODIUM PHOSPHATE 10 MG/ML IJ SOLN
10.0000 mg | Freq: Once | INTRAMUSCULAR | Status: AC
  Administered 2014-01-19: 10 mg via INTRAVENOUS

## 2014-01-19 MED ORDER — ONDANSETRON 8 MG/NS 50 ML IVPB
INTRAVENOUS | Status: AC
  Filled 2014-01-19: qty 8

## 2014-01-19 NOTE — Patient Instructions (Signed)
[facility] Discharge Instructions for Patients Receiving Chemotherapy  Today you received the following chemotherapy agents Topotecan.  To help prevent nausea and vomiting after your treatment, we encourage you to take your nausea medication as prescribed.   If you develop nausea and vomiting that is not controlled by your nausea medication, call the clinic.   BELOW ARE SYMPTOMS THAT SHOULD BE REPORTED IMMEDIATELY:  *FEVER GREATER THAN 100.5 F  *CHILLS WITH OR WITHOUT FEVER  NAUSEA AND VOMITING THAT IS NOT CONTROLLED WITH YOUR NAUSEA MEDICATION  *UNUSUAL SHORTNESS OF BREATH  *UNUSUAL BRUISING OR BLEEDING  TENDERNESS IN MOUTH AND THROAT WITH OR WITHOUT PRESENCE OF ULCERS  *URINARY PROBLEMS  *BOWEL PROBLEMS  UNUSUAL RASH Items with * indicate a potential emergency and should be followed up as soon as possible.  Feel free to call the clinic you have any questions or concerns. The clinic phone number is [phone].

## 2014-01-24 ENCOUNTER — Encounter (HOSPITAL_COMMUNITY): Payer: Self-pay

## 2014-01-24 ENCOUNTER — Ambulatory Visit (HOSPITAL_COMMUNITY)
Admission: RE | Admit: 2014-01-24 | Discharge: 2014-01-24 | Disposition: A | Discharge status: Home / Self Care | Payer: Medicare Other | Source: Ambulatory Visit | Attending: Physician Assistant | Admitting: Physician Assistant

## 2014-01-24 DIAGNOSIS — R5383 Other fatigue: Secondary | ICD-10-CM

## 2014-01-24 DIAGNOSIS — Z902 Acquired absence of lung [part of]: Secondary | ICD-10-CM | POA: Insufficient documentation

## 2014-01-24 DIAGNOSIS — D3500 Benign neoplasm of unspecified adrenal gland: Secondary | ICD-10-CM | POA: Insufficient documentation

## 2014-01-24 DIAGNOSIS — Z9221 Personal history of antineoplastic chemotherapy: Secondary | ICD-10-CM | POA: Insufficient documentation

## 2014-01-24 DIAGNOSIS — Z853 Personal history of malignant neoplasm of breast: Secondary | ICD-10-CM | POA: Insufficient documentation

## 2014-01-24 DIAGNOSIS — R599 Enlarged lymph nodes, unspecified: Secondary | ICD-10-CM | POA: Insufficient documentation

## 2014-01-24 DIAGNOSIS — M8448XA Pathological fracture, other site, initial encounter for fracture: Secondary | ICD-10-CM | POA: Insufficient documentation

## 2014-01-24 DIAGNOSIS — I708 Atherosclerosis of other arteries: Secondary | ICD-10-CM | POA: Insufficient documentation

## 2014-01-24 DIAGNOSIS — J90 Pleural effusion, not elsewhere classified: Secondary | ICD-10-CM | POA: Insufficient documentation

## 2014-01-24 DIAGNOSIS — R5381 Other malaise: Secondary | ICD-10-CM | POA: Insufficient documentation

## 2014-01-24 DIAGNOSIS — R05 Cough: Secondary | ICD-10-CM | POA: Insufficient documentation

## 2014-01-24 DIAGNOSIS — R059 Cough, unspecified: Secondary | ICD-10-CM | POA: Insufficient documentation

## 2014-01-24 DIAGNOSIS — C787 Secondary malignant neoplasm of liver and intrahepatic bile duct: Secondary | ICD-10-CM | POA: Insufficient documentation

## 2014-01-24 DIAGNOSIS — C3490 Malignant neoplasm of unspecified part of unspecified bronchus or lung: Secondary | ICD-10-CM | POA: Insufficient documentation

## 2014-01-24 MED ORDER — IOHEXOL 300 MG/ML  SOLN
100.0000 mL | Freq: Once | INTRAMUSCULAR | Status: AC | PRN
  Administered 2014-01-24: 100 mL via INTRAVENOUS

## 2014-01-26 ENCOUNTER — Ambulatory Visit (HOSPITAL_BASED_OUTPATIENT_CLINIC_OR_DEPARTMENT_OTHER): Payer: Medicare Other | Admitting: Internal Medicine

## 2014-01-26 ENCOUNTER — Encounter: Payer: Self-pay | Admitting: Internal Medicine

## 2014-01-26 ENCOUNTER — Ambulatory Visit: Payer: Medicare Other

## 2014-01-26 ENCOUNTER — Ambulatory Visit (HOSPITAL_BASED_OUTPATIENT_CLINIC_OR_DEPARTMENT_OTHER): Payer: Medicare Other

## 2014-01-26 ENCOUNTER — Telehealth: Payer: Self-pay | Admitting: Dietician

## 2014-01-26 VITALS — BP 126/62 | HR 79 | Temp 98.1°F | Resp 18 | Ht 67.0 in | Wt 123.5 lb

## 2014-01-26 DIAGNOSIS — C7A090 Malignant carcinoid tumor of the bronchus and lung: Secondary | ICD-10-CM

## 2014-01-26 DIAGNOSIS — Z95828 Presence of other vascular implants and grafts: Secondary | ICD-10-CM

## 2014-01-26 DIAGNOSIS — C3490 Malignant neoplasm of unspecified part of unspecified bronchus or lung: Secondary | ICD-10-CM

## 2014-01-26 DIAGNOSIS — C3492 Malignant neoplasm of unspecified part of left bronchus or lung: Secondary | ICD-10-CM

## 2014-01-26 DIAGNOSIS — C787 Secondary malignant neoplasm of liver and intrahepatic bile duct: Secondary | ICD-10-CM

## 2014-01-26 LAB — COMPREHENSIVE METABOLIC PANEL (CC13)
ALBUMIN: 3 g/dL — AB (ref 3.5–5.0)
ALT: 20 U/L (ref 0–55)
AST: 29 U/L (ref 5–34)
Alkaline Phosphatase: 127 U/L (ref 40–150)
Anion Gap: 8 mEq/L (ref 3–11)
BUN: 11.3 mg/dL (ref 7.0–26.0)
CHLORIDE: 101 meq/L (ref 98–109)
CO2: 25 mEq/L (ref 22–29)
CREATININE: 0.9 mg/dL (ref 0.6–1.1)
Calcium: 9.8 mg/dL (ref 8.4–10.4)
Glucose: 115 mg/dl (ref 70–140)
Potassium: 3.9 mEq/L (ref 3.5–5.1)
SODIUM: 133 meq/L — AB (ref 136–145)
Total Bilirubin: 0.56 mg/dL (ref 0.20–1.20)
Total Protein: 6.3 g/dL — ABNORMAL LOW (ref 6.4–8.3)

## 2014-01-26 MED ORDER — HEPARIN SOD (PORK) LOCK FLUSH 100 UNIT/ML IV SOLN
500.0000 [IU] | Freq: Once | INTRAVENOUS | Status: AC
  Administered 2014-01-26: 500 [IU] via INTRAVENOUS
  Filled 2014-01-26: qty 5

## 2014-01-26 MED ORDER — SODIUM CHLORIDE 0.9 % IJ SOLN
10.0000 mL | INTRAMUSCULAR | Status: AC | PRN
  Administered 2014-01-26 (×2): 10 mL via INTRAVENOUS
  Filled 2014-01-26: qty 10

## 2014-01-26 MED ORDER — SODIUM CHLORIDE 0.9 % IJ SOLN
10.0000 mL | INTRAMUSCULAR | Status: DC | PRN
  Filled 2014-01-26: qty 10

## 2014-01-26 NOTE — Patient Instructions (Signed)
Smoking Cessation, Tips for Success If you are ready to quit smoking, congratulations! You have chosen to help yourself be healthier. Cigarettes bring nicotine, tar, carbon monoxide, and other irritants into your body. Your lungs, heart, and blood vessels will be able to work better without these poisons. There are many different ways to quit smoking. Nicotine gum, nicotine patches, a nicotine inhaler, or nicotine nasal spray can help with physical craving. Hypnosis, support groups, and medicines help break the habit of smoking. WHAT THINGS CAN I DO TO MAKE QUITTING EASIER?  Here are some tips to help you quit for good:  Pick a date when you will quit smoking completely. Tell all of your friends and family about your plan to quit on that date.  Do not try to slowly cut down on the number of cigarettes you are smoking. Pick a quit date and quit smoking completely starting on that day.  Throw away all cigarettes.   Clean and remove all ashtrays from your home, work, and car.   On a card, write down your reasons for quitting. Carry the card with you and read it when you get the urge to smoke.   Cleanse your body of nicotine. Drink enough water and fluids to keep your urine clear or pale yellow. Do this after quitting to flush the nicotine from your body.   Learn to predict your moods. Do not let a bad situation be your excuse to have a cigarette. Some situations in your life might tempt you into wanting a cigarette.   Never have "just one" cigarette. It leads to wanting another and another. Remind yourself of your decision to quit.   Change habits associated with smoking. If you smoked while driving or when feeling stressed, try other activities to replace smoking. Stand up when drinking your coffee. Brush your teeth after eating. Sit in a different chair when you read the paper. Avoid alcohol while trying to quit, and try to drink fewer caffeinated beverages. Alcohol and caffeine may urge  you to smoke.   Avoid foods and drinks that can trigger a desire to smoke, such as sugary or spicy foods and alcohol.   Ask people who smoke not to smoke around you.   Have something planned to do right after eating or having a cup of coffee. For example, plan to take a walk or exercise.   Try a relaxation exercise to calm you down and decrease your stress. Remember, you may be tense and nervous for the first 2 weeks after you quit, but this will pass.   Find new activities to keep your hands busy. Play with a pen, coin, or rubber band. Doodle or draw things on paper.   Brush your teeth right after eating. This will help cut down on the craving for the taste of tobacco after meals. You can also try mouthwash.   Use oral substitutes in place of cigarettes. Try using lemon drops, carrots, cinnamon sticks, or chewing gum. Keep them handy so they are available when you have the urge to smoke.   When you have the urge to smoke, try deep breathing.   Designate your home as a nonsmoking area.   If you are a heavy smoker, ask your health care provider about a prescription for nicotine chewing gum. It can ease your withdrawal from nicotine.   Reward yourself. Set aside the cigarette money you save and buy yourself something nice.   Look for support from others. Join a support group or   smoking cessation program. Ask someone at home or at work to help you with your plan to quit smoking.   Always ask yourself, "Do I need this cigarette or is this just a reflex?" Tell yourself, "Today, I choose not to smoke," or "I do not want to smoke." You are reminding yourself of your decision to quit.  Do not replace cigarette smoking with electronic cigarettes (commonly called e-cigarettes). The safety of e-cigarettes is unknown, and some may contain harmful chemicals.  If you relapse, do not give up! Plan ahead and think about what you will do the next time you get the urge to smoke.  HOW WILL  I FEEL WHEN I QUIT SMOKING? You may have symptoms of withdrawal because your body is used to nicotine (the addictive substance in cigarettes). You may crave cigarettes, be irritable, feel very hungry, cough often, get headaches, or have difficulty concentrating. The withdrawal symptoms are only temporary. They are strongest when you first quit but will go away within 10 14 days. When withdrawal symptoms occur, stay in control. Think about your reasons for quitting. Remind yourself that these are signs that your body is healing and getting used to being without cigarettes. Remember that withdrawal symptoms are easier to treat than the major diseases that smoking can cause.  Even after the withdrawal is over, expect periodic urges to smoke. However, these cravings are generally short lived and will go away whether you smoke or not. Do not smoke!  WHAT RESOURCES ARE AVAILABLE TO HELP ME QUIT SMOKING? Your health care provider can direct you to community resources or hospitals for support, which may include:  Group support.  Education.  Hypnosis.  Therapy. Document Released: 06/28/2004 Document Revised: 07/21/2013 Document Reviewed: 03/18/2013 [employer] Patient Information 2014 [employer].  

## 2014-01-26 NOTE — Progress Notes (Signed)
Hospice referral called into [facility] of [facility]

## 2014-01-26 NOTE — Progress Notes (Signed)
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   PRIOR THERAPY:   1) Status post left upper lobectomy with lymph node dissection.  2) Adjuvant systemic chemotherapy with cisplatin 75 mg region squared and Alimta 500 mg per meter squared given every 3 weeks for total of 4 cycles, status post 4 cycles. Last dose was given 03/02/2013.  3)  Systemic chemotherapy with carboplatin for AUC of 5 on day 1 and etoposide at 120 mg/M2 on days 1, 2 and 3 with Neulasta support on day 4 every 3 weeks. First dose of chemotherapy on 07/19/2013. Status post 4 cycles. Last cycle was on 09/20/2013 discontinued secondary to disease progression.  4) Topotecan 3 mg/M2 on a weekly basis, status post 9 cycles. First dose 11/03/2013  CURRENT THERAPY: Palliative and hospice care.  CHEMOTHERAPY INTENT: Palliative  CURRENT # OF CHEMOTHERAPY CYCLES: 0  CURRENT ANTIEMETICS: Zofran, dexamethasone and Compazine.  CURRENT SMOKING STATUS: Former smoker  ORAL CHEMOTHERAPY AND CONSENT: None  CURRENT BISPHOSPHONATES USE: None  PAIN MANAGEMENT: 0/10  NARCOTICS INDUCED CONSTIPATION: None  LIVING WILL AND CODE STATUS: ?    INTERVAL HISTORY: Sara Mcclure 68 y.o. female returns to the clinic today for followup visit accompanied by her husband. The patient continues to complain of increasing fatigue and weakness. She denied having any nausea or vomiting, no fever or chills. She has no significant weight loss or night sweats. She denied having any chest pain but  continues to have shortness breath with exertion with no cough or hemoptysis. She tolerated the last cycle of her treatment with single agent topotecan fairly well but the patient missed several doses in the past secondary to pancytopenia. She had repeat CT scan of the chest, abdomen and pelvis performed recently and she is here for evaluation and discussion of her scan results.  MEDICAL HISTORY: Past Medical History  Diagnosis Date  . GERD (gastroesophageal reflux disease)   . Asthma   . Hypertension   . Thyroid disease   . Anxiety   . Vertigo   . Breast cancer   . COPD (chronic obstructive pulmonary disease)   . PONV (postoperative nausea and vomiting)   . Diabetes mellitus     diet controlled    ALLERGIES:  is allergic to metformin and related.  MEDICATIONS:  Current Outpatient Prescriptions  Medication Sig Dispense Refill  . acetaminophen (TYLENOL) 500 MG tablet Take 500 mg by mouth every 6 (six) hours as needed.      Marland Kitchen albuterol (ACCUNEB) 1.25 MG/3ML nebulizer solution Take 1 ampule by nebulization 2 (two) times daily as needed. For shortness of breath      . albuterol (PROVENTIL HFA;VENTOLIN HFA) 108 (90 BASE) MCG/ACT inhaler Inhale 2 puffs into the lungs every 6 (six) hours as needed. For shortness of breath      . ALPRAZolam (XANAX) 0.25 MG tablet Take 0.25 mg by mouth 3 (three) times daily as needed for anxiety.       . Alum Hydroxide-Mag Carbonate (GAVISCON PO) Take 1 tablet by mouth daily as needed (stomach).      Marland Kitchen aspirin EC  81 MG tablet Take 81 mg by mouth every morning.       . cetirizine (ZYRTEC) 10 MG tablet Take 10 mg by mouth daily as needed for allergies.      . Cholecalciferol (VITAMIN D) 2000 UNITS tablet Take 2,000 Units by mouth daily with lunch.       . dextromethorphan-guaiFENesin (MUCINEX DM) 30-600 MG per 12 hr tablet Take 1 tablet by mouth every 12 (twelve) hours.      . Fluticasone-Salmeterol (ADVAIR) 250-50 MCG/DOSE AEPB Inhale 1 puff into the lungs  every 12 (twelve) hours.      . fosinopril (MONOPRIL) 20 MG tablet Take 20 mg by mouth daily after breakfast.       . HYDROcodone-acetaminophen (NORCO/VICODIN) 5-325 MG per tablet Take 1 tablet by mouth every 6 (six) hours as needed for moderate pain.  40 tablet  0  . levothyroxine (SYNTHROID, LEVOTHROID) 50 MCG tablet Take 50 mcg by mouth daily before breakfast.       . lidocaine-prilocaine (EMLA) cream APPLY TO PORT 1 HOUR BEFORE CHEMOTHERAPY  30 g  0  . metoprolol tartrate (LOPRESSOR) 25 MG tablet Take 1 tablet (25 mg total) by mouth 2 (two) times daily.  60 tablet  1  . montelukast (SINGULAIR) 10 MG tablet Take 10 mg by mouth at bedtime.      . ondansetron (ZOFRAN-ODT) 8 MG disintegrating tablet Take 8 mg by mouth every 8 (eight) hours as needed for nausea.       . Polyvinyl Alcohol-Povidone (REFRESH OP) Place 2 drops into both eyes daily as needed (For watery eyes.).      Marland Kitchen PRESCRIPTION MEDICATION She receives her treatments at the [facility] with Dr. Julien Nordmann. She received a 3 day cycle from 07/19/13 to 07/21/13 of Etoposide 200mg  and then an injection of Neulasta 6mg  on 07/22/13.      Marland Kitchen prochlorperazine (COMPAZINE) 10 MG tablet Take 10 mg by mouth every 6 (six) hours as needed (For nausea.).       No current facility-administered medications for this visit.   Facility-Administered Medications Ordered in Other Visits  Medication Dose Route Frequency Provider Last Rate Last Dose  . sodium chloride 0.9 % injection 10 mL  10 mL Intravenous PRN Curt Bears, MD   10 mL at 01/26/14 1318    SURGICAL HISTORY:  Past Surgical History  Procedure Laterality Date  . Cardiac catherization    . Breast lumpectomy    . Tubal ligation    . Video assisted thoracoscopy (vats)/wedge resection Left 11/19/2012    Procedure: VIDEO ASSISTED THORACOSCOPY (VATS)/WEDGE RESECTION;  Surgeon: Melrose Nakayama, MD;  Location: [facility];  Service: Thoracic;  Laterality: Left;  left upper lobe wedge  resection  . Lobectomy Left 11/19/2012    Procedure: LOBECTOMY;  Surgeon: Melrose Nakayama, MD;  Location: [facility];  Service: Thoracic;  Laterality: Left;  left upper lobe  . Lymph node dissection Left 11/19/2012    Procedure: LYMPH NODE DISSECTION;  Surgeon: Melrose Nakayama, MD;  Location: [facility];  Service: Thoracic;  Laterality: Left;    REVIEW OF SYSTEMS:  Constitutional: positive for anorexia, fatigue and weight loss Eyes: negative Ears, nose, mouth, throat, and face: negative Respiratory: positive for dyspnea on exertion Cardiovascular: negative Gastrointestinal: negative Genitourinary:negative Integument/breast: negative Hematologic/lymphatic: negative Musculoskeletal:negative Neurological: negative Behavioral/Psych: negative Endocrine: negative Allergic/Immunologic: negative   PHYSICAL EXAMINATION: General appearance: alert, cooperative and no distress Head: Normocephalic, without obvious abnormality, atraumatic Neck: no adenopathy, no JVD, supple,  symmetrical, trachea midline and thyroid not enlarged, symmetric, no tenderness/mass/nodules Lymph nodes: Cervical, supraclavicular, and axillary nodes normal. Resp: clear to auscultation bilaterally Back: symmetric, no curvature. ROM normal. No CVA tenderness. Cardio: regular rate and rhythm, S1, S2 normal, no murmur, click, rub or gallop GI: soft, non-tender; bowel sounds normal; no masses,  no organomegaly Extremities: extremities normal, atraumatic, no cyanosis or edema Neurologic: Alert and oriented X 3, normal strength and tone. Normal symmetric reflexes. Normal coordination and gait   ECOG PERFORMANCE STATUS: 2 - Symptomatic, <50% confined to bed  Blood pressure 126/62, pulse 79, temperature 98.1 F (36.7 C), temperature source Oral, resp. rate 18, height 5\' 7"  (1.702 m), weight 123 lb 8 oz (56.019 kg), SpO2 100.00%.  LABORATORY DATA: Lab Results  Component Value Date   WBC 4.3 01/19/2014   HGB 9.2* 01/19/2014   HCT  26.4* 01/19/2014   MCV 90.3 01/19/2014   PLT 135* 01/19/2014      Chemistry      Component Value Date/Time   NA 133* 01/26/2014 1316   NA 135 08/05/2013 1132   K 3.9 01/26/2014 1316   K 4.0 08/05/2013 1132   CL 101 08/05/2013 1132   CL 102 03/30/2013 0951   CO2 25 01/26/2014 1316   CO2 23 08/05/2013 1132   BUN 11.3 01/26/2014 1316   BUN 13 08/05/2013 1132   CREATININE 0.9 01/26/2014 1316   CREATININE 0.99 08/05/2013 1132      Component Value Date/Time   CALCIUM 9.8 01/26/2014 1316   CALCIUM 10.8* 08/05/2013 1132   ALKPHOS 127 01/26/2014 1316   ALKPHOS 65 11/21/2012 0445   AST 29 01/26/2014 1316   AST 15 11/21/2012 0445   ALT 20 01/26/2014 1316   ALT 16 11/21/2012 0445   BILITOT 0.56 01/26/2014 1316   BILITOT 0.4 11/21/2012 0445       RADIOGRAPHIC STUDIES: Ct Chest W Contrast  01/24/2014   CLINICAL DATA:  Lung cancer with weakness and cough. Chemotherapy in progress. Remote history of breast cancer.  EXAM: CT CHEST, ABDOMEN, AND PELVIS WITH CONTRAST  TECHNIQUE: Multidetector CT imaging of the chest, abdomen and pelvis was performed following the standard protocol during bolus administration of intravenous contrast.  CONTRAST:  144mL OMNIPAQUE IOHEXOL 300 MG/ML  SOLN  COMPARISON:  CT CHEST W/CM dated 10/08/2013; NM PET IMAGE RESTAG (PS) SKULL BASE TO THIGH dated 07/12/2013; CT CHEST W/CM dated 06/29/2013; CT ABD/PELVIS W CM dated 08/27/2013  FINDINGS:   CT CHEST FINDINGS  Right subclavian Port-A-Cath terminates in the high right atrium. There is mediastinal and subcarinal adenopathy. Index high right paratracheal lymph node measures 1.7 cm. Subcarinal lymph node measures 1.9 cm. Left hilar soft tissue density is seen in association with left upper lobectomy clips. Amorphous nature makes accurate measurement difficult. No axillary adenopathy. Surgical clips in the left axilla. Postoperative changes in the left breast. 8 mm short axis lymph node is seen adjacent to the descending thoracic aorta (image 43).   Extrapleural nodules and soft tissue lesion in the posteromedial aspect of the left hemi thorax measure up to 1.3 x 2.5 cm (image 32), previously 1.0 x 2.0 cm. Chronic appearing small left pleural effusion, stable. Suspect a spiculated nodule at the base of the left lower lobe, measuring approximately 1.2 x 2.1 cm (series 2, image 45), not well seen previously. Scarring and volume loss are seen in the surrounding left lower lobe. Right lung is clear. There is irregularity of the left lower lobe bronchus. Airway is otherwise  unremarkable.    CT ABDOMEN AND PELVIS FINDINGS  Hepatic metastases have progressed in the interval both in size and number. Index lesion in the right hepatic lobe measures 5.7 cm (Image 54), previously 2.7 cm. Gallbladder and right adrenal gland are unremarkable. Low-attenuation nodule in the medial limb left adrenal gland measure 6 x 9 mm, stable and characterize as an adenoma on 07/12/2013. Sub cm low-attenuation lesions in the kidneys are unchanged. Spleen, pancreas, stomach and bowel are unremarkable. Uterus and ovaries are visualized. Calcified lesions in the uterus likely represent fibroids.  Atherosclerotic calcification of the arterial vasculature without abdominal aortic aneurysm. Retroperitoneal lymph nodes measure up to 1.5 cm in the left periaortic station (image 63), previously 5 mm. Gastrohepatic ligament and porta hepatis lymph nodes measure up to 1.9 cm (image 64). There are several new lytic lesions seen predominantly in the pelvis with specific attention to involvement of the right acetabulum. Pathologic fractures of the right seventh and tenth ribs are noted.    IMPRESSION: 1. Interval progression of metastatic lung cancer as evidenced by new/enlarging mediastinal and left hilar adenopathy, left extrapleural nodal disease, left lower lobe nodule, new/enlarging hepatic lesions, abdominal adenopathy and progressive lytic osseous metastases. 2. Pathologic right rib fractures.  3. Chronic appearing small left pleural effusion, stable. 4. Left adrenal adenoma.   Electronically Signed   By: Lorin Picket M.D.   On: 01/24/2014 17:16   ASSESSMENT AND PLAN: This is a very pleasant 68 years old white female recently diagnosed with neuroendocrine carcinoma, small cell with multiple liver metastases as well as left AP window lymphadenopathy, extrapleural nodal metastasis and left iliac bone lesion.  She has a history of neuroendocrine carcinoma, large cell type diagnosed as stage IIA in February of 2014 status post left upper lobectomy followed by 4 cycles of adjuvant chemotherapy with cisplatin and Alimta.  She had evidence for disease recurrence and the patient was treated with a course of systemic chemotherapy with carboplatin and etoposide for 4 cycles discontinued secondary to disease progression. She was also treated with the second line chemotherapy with single agent topotecan status post 9 cycles discontinued today secondary to disease progression. Her recent scan showed significant disease progression in the lung as well as liver and bone. I discussed the scan results with the patient and her husband. I recommended for the patient to consider palliative care and hospice at this point. I do think the patient would be a good candidate for any further chemotherapy and most of the remaining options are not very effective and small cell lung cancer. The patient and her husband agreed to the current plan of hospice referral. I would see on as-needed basis at this point. She was advised to call immediately if she has any concerning symptoms. The patient voices understanding of current disease status and treatment options and is in agreement with the current care plan. I spent 15 minutes of face-to-face counseling with the patient and her husband how to the total visit time 25 minutes. All questions were answered. The patient knows to call the clinic with any problems, questions or  concerns. We can certainly see the patient much sooner if necessary.  Disclaimer: This note was dictated with voice recognition software. Similar sounding words can inadvertently be transcribed and may not be corrected upon review.    Curt Bears, MD 01/26/2014

## 2014-01-26 NOTE — Telephone Encounter (Signed)
Brief Outpatient Oncology Nutrition Note  Patient has been identified to be at risk on malnutrition screen.  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   Called patient due to weight loss.  Patient states that she has no appetite and is not eating well.  Got sick on Glucerna while on chemo and will not drink again.  Discussed need for regular meals and snacks and option of trying Boost Glucose Control.  Patient struggling with intake.    Will mail coupon for Boost and tips to increase calories and protein, tips for poor appetite along with the contact information for the [facility].    Antonieta Iba, RD, LDN

## 2014-01-26 NOTE — Patient Instructions (Signed)

## 2014-01-27 ENCOUNTER — Telehealth: Payer: Self-pay | Admitting: Medical Oncology

## 2014-01-27 NOTE — Telephone Encounter (Signed)
Faxed recent office notes

## 2014-01-28 ENCOUNTER — Telehealth: Payer: Self-pay | Admitting: Medical Oncology

## 2014-01-28 NOTE — Telephone Encounter (Signed)
Pt weak and indigestion . RN  asking CBC results and if it is okay for reglan to help with indigestion.Per Ad[facility] okay for reglan.10 mg po ac and hs I left t his message on Teresa's phone.

## 2014-02-22 ENCOUNTER — Telehealth: Payer: Self-pay | Admitting: *Deleted

## 2014-02-22 NOTE — Telephone Encounter (Signed)
Per teresa in hospice, Pt will be started on prednisone for appetite stimulation.  SLJ

## 2014-03-08 ENCOUNTER — Other Ambulatory Visit: Payer: Self-pay | Admitting: Internal Medicine

## 2014-03-08 DIAGNOSIS — R110 Nausea: Secondary | ICD-10-CM

## 2014-03-08 NOTE — Telephone Encounter (Signed)
reglan refilled per [facility] request.

## 2014-03-24 ENCOUNTER — Other Ambulatory Visit: Payer: Self-pay | Admitting: *Deleted

## 2014-04-13 DEATH — deceased

## 2014-06-10 ENCOUNTER — Other Ambulatory Visit: Payer: Self-pay | Admitting: Pharmacist

## 2014-09-22 ENCOUNTER — Encounter (HOSPITAL_COMMUNITY): Payer: Self-pay | Admitting: Cardiovascular Disease

## 2014-10-18 IMAGING — CR DG CHEST 1V PORT
1 series · 1 of 1 positions shown · non-contrast
Comparison: 11/19/2012.

CLINICAL DATA: Left VATS.  Chest tube.

PORTABLE CHEST - 1 VIEW

[AP]
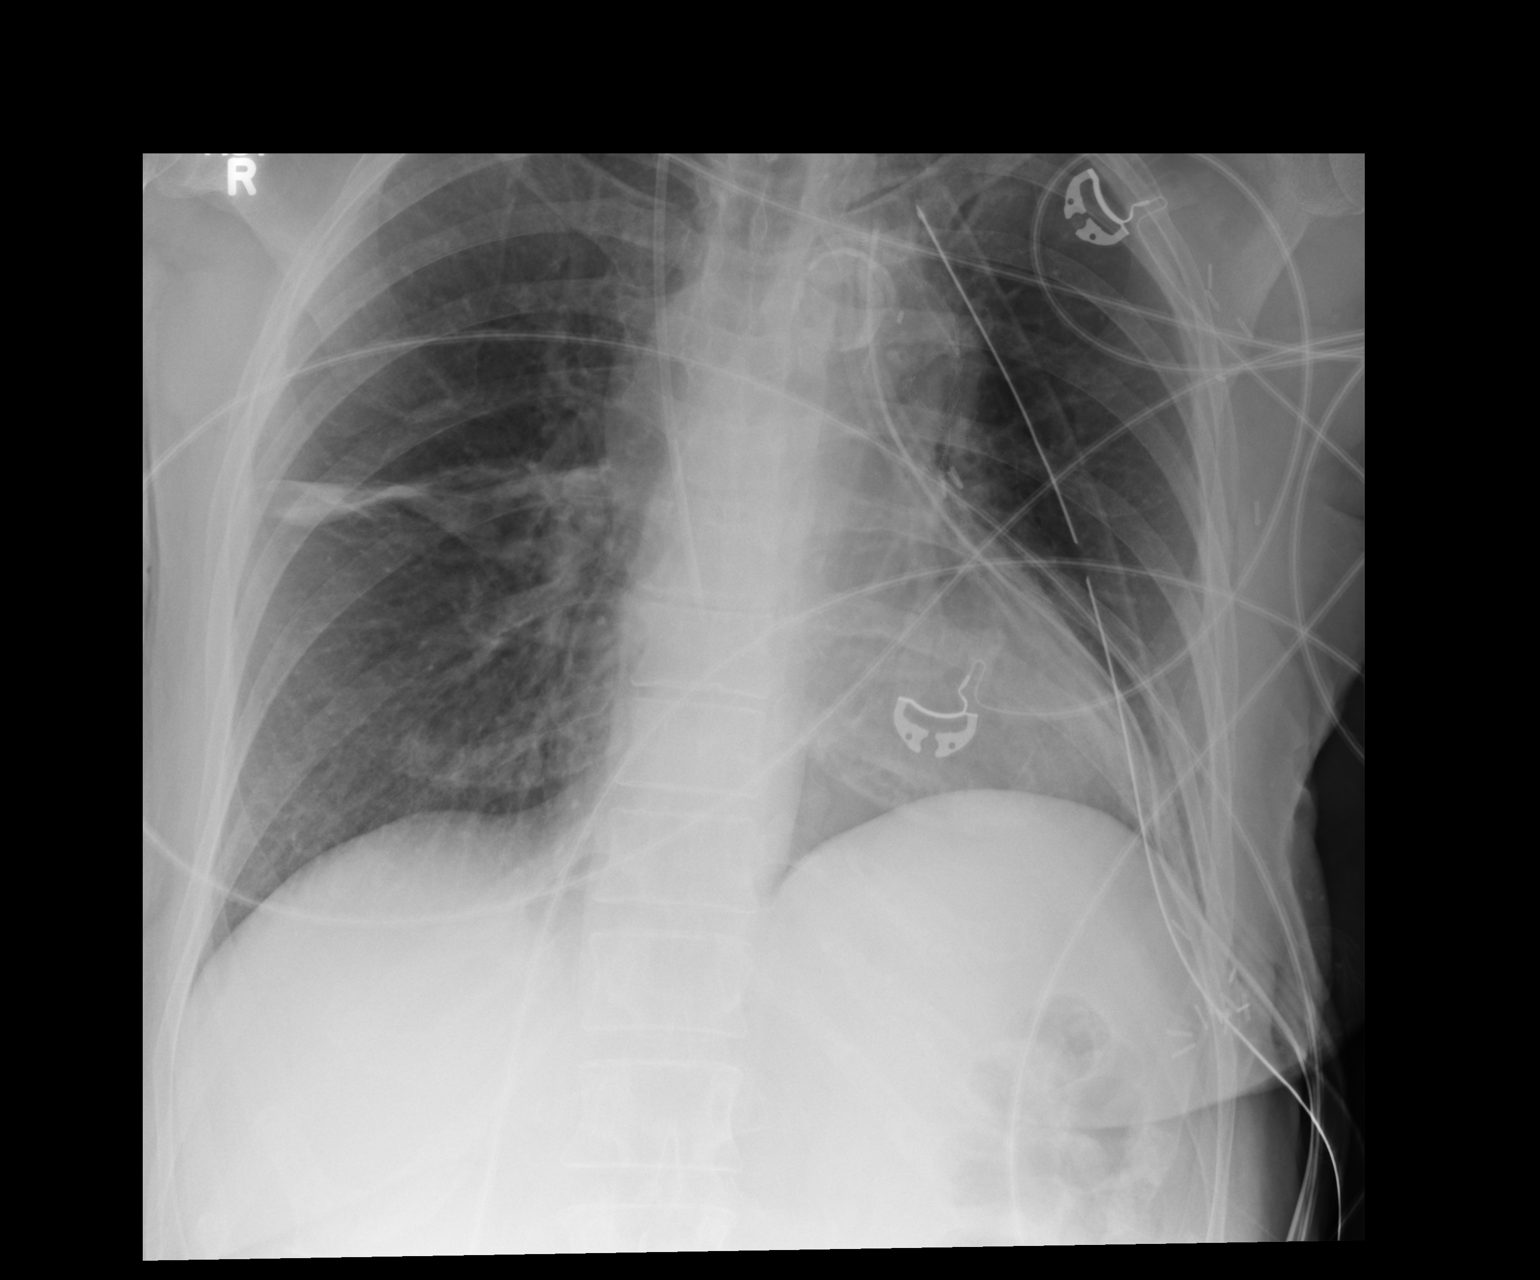

[1 of 1 positions shown; findings below may reference images not displayed]

FINDINGS: The left pneumothorax can no longer be appreciated.  Left
thoracostomy tubes are unchanged.  Left axillary dissection clips
and surgical clips in the left breast.

Subsegmental atelectasis in the right midlung.  The right IJ
central line is present with the tip at the cavoatrial junction.
Cardiopericardial silhouette appears within normal limits and
unchanged.  Aortic arch atherosclerosis. Monitoring leads are
projected over the chest.
IMPRESSION: 1.  The left pneumothorax is no longer visualized.  Unchanged dual
left thoracostomy tubes.
2.  Unchanged right IJ central line.
3.  No airspace disease.  Subsegmental atelectasis in the right
midlung.

## 2014-10-21 IMAGING — CR DG CHEST 1V PORT
1 series · 1 of 1 positions shown · non-contrast
Comparison: 11/22/2012 and CT chest 11/03/2012.

CLINICAL DATA: Pneumothorax.

PORTABLE CHEST - 1 VIEW

[AP]
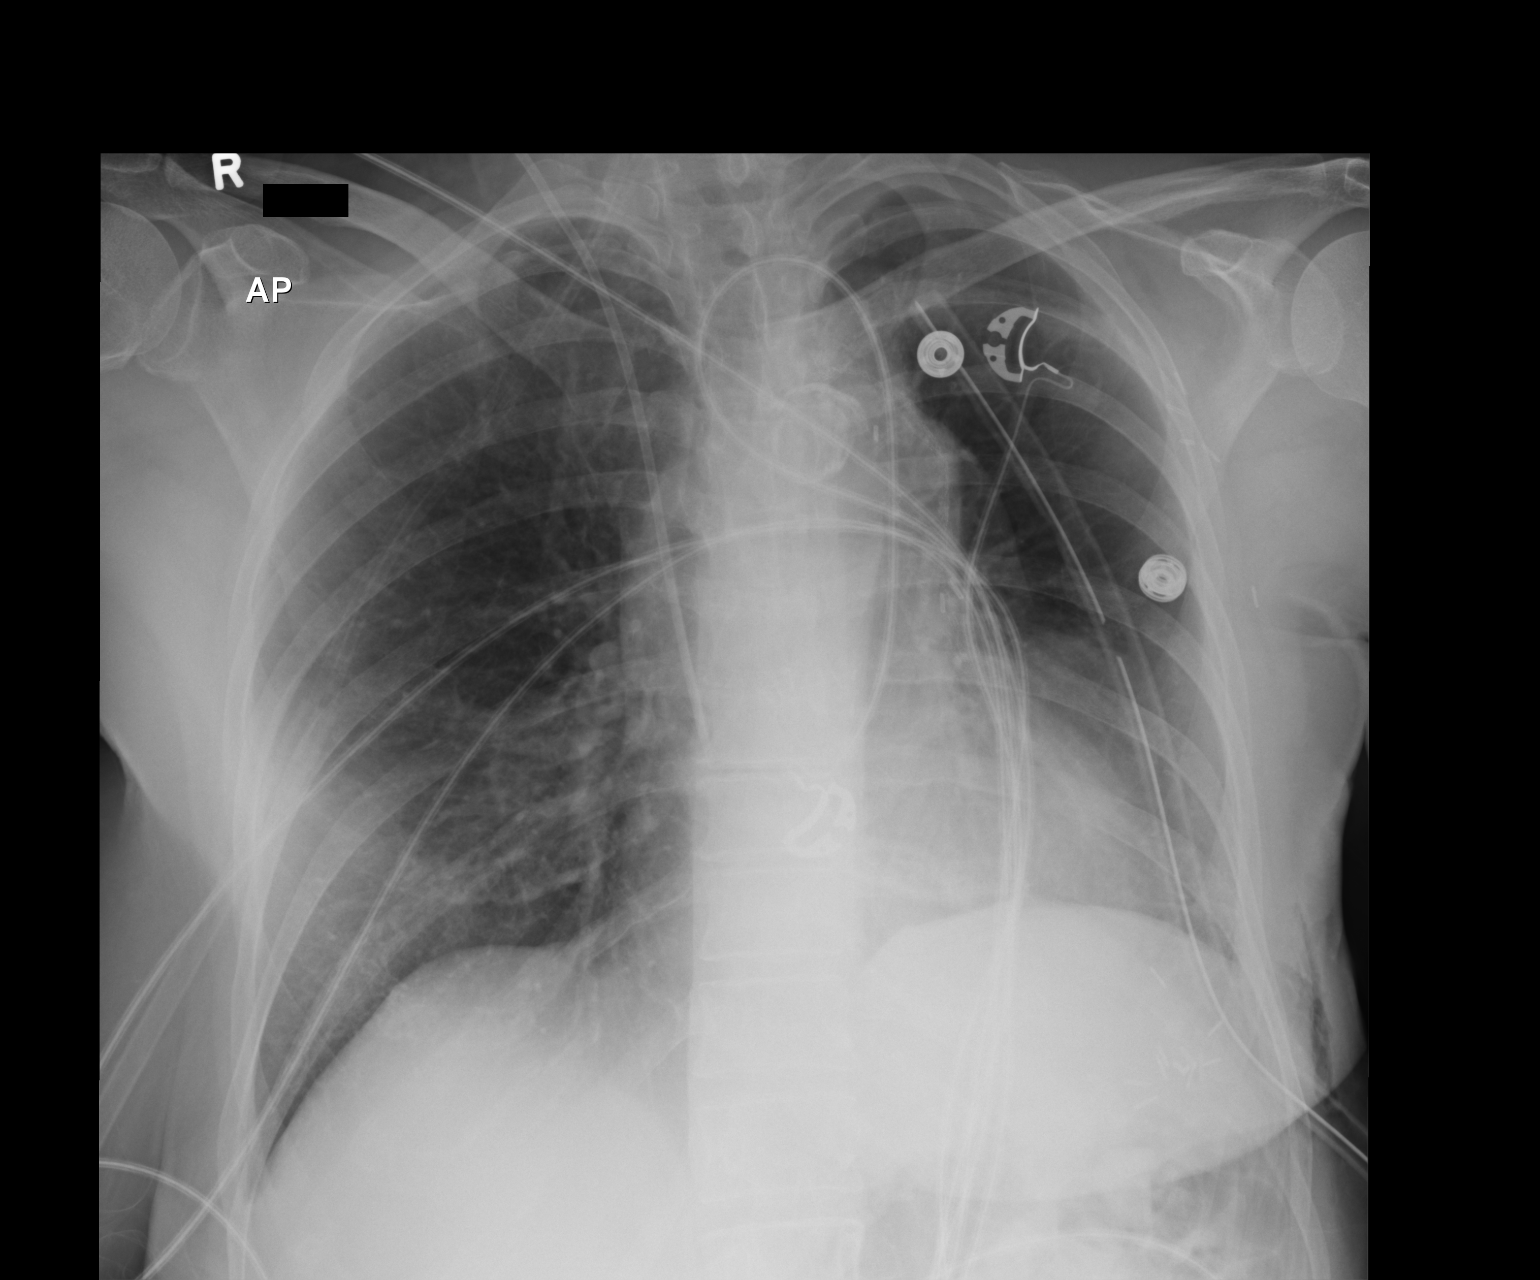

[1 of 1 positions shown; findings below may reference images not displayed]

FINDINGS: Trachea is midline.  Heart size normal.  Thoracic aorta
is calcified.  Right IJ central line tip projects over the SVC.
Left chest tube terminates at the apex of the left hemithorax.
Suspect small residual left apical pneumothorax, superimposed with
the left 3rd posterior rib.  Mild bibasilar air space disease,
unchanged.  Postoperative changes and volume loss in the left
hemithorax.  There may be a tiny right pleural effusion.  Small
amount of subcutaneous emphysema is seen along the lower left
lateral chest wall.  Surgical clips project over the left upper
quadrant.
IMPRESSION: 1.  Probable residual small left apical pneumothorax with left
chest tube in place.
2.  Postoperative changes in the left hemithorax, stable.
3.  Minimal bibasilar atelectasis and probable tiny right pleural
effusion.

## 2014-10-22 IMAGING — CR DG CHEST 1V PORT
1 series · 1 of 1 positions shown · non-contrast
Comparison: 11/24/2012 [DATE]

CLINICAL DATA: Left chest tube to water seal.

PORTABLE CHEST - 1 VIEW

[AP]
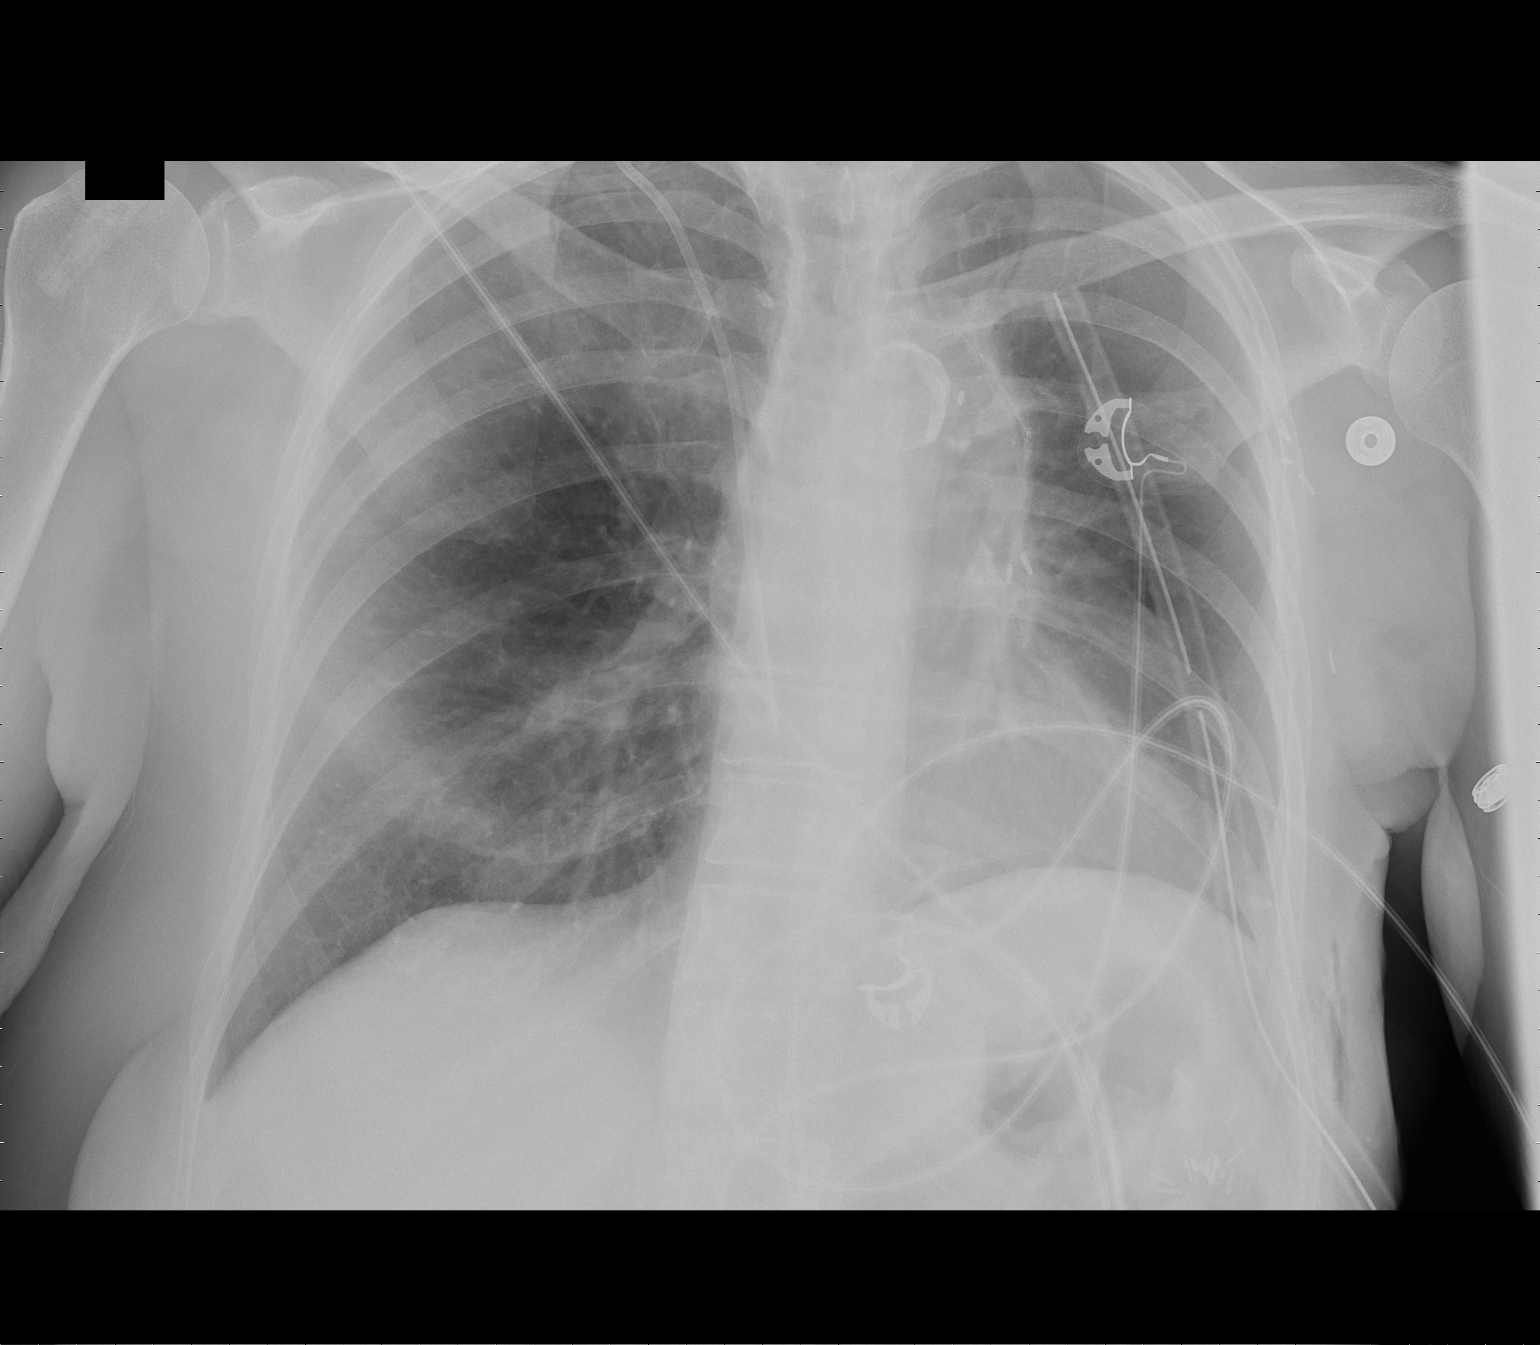

[1 of 1 positions shown; findings below may reference images not displayed]

FINDINGS: Left-sided chest tube is in place.  The apical aspect of
left pneumothorax is minimally larger than on the prior exam (5%).
Inferior aspect of left-sided pneumothorax is stable.

Postsurgical changes left chest.

Right central line tip distal superior vena cava/caval atrial
junction.

Mild mediastinal shift to the left.
IMPRESSION: Left-sided chest tube is in place.  The apical aspect of left
pneumothorax is minimally larger than on the prior exam (5%).
Inferior aspect of left-sided pneumothorax is stable.

This is a call report.

## 2014-10-23 IMAGING — CR DG CHEST 2V
2 series · 2 of 2 positions shown · non-contrast
Comparison: 11/24/2012.

CLINICAL DATA: Shortness of breath, chest tube removal.

CHEST - 2 VIEW

[w chest pa]
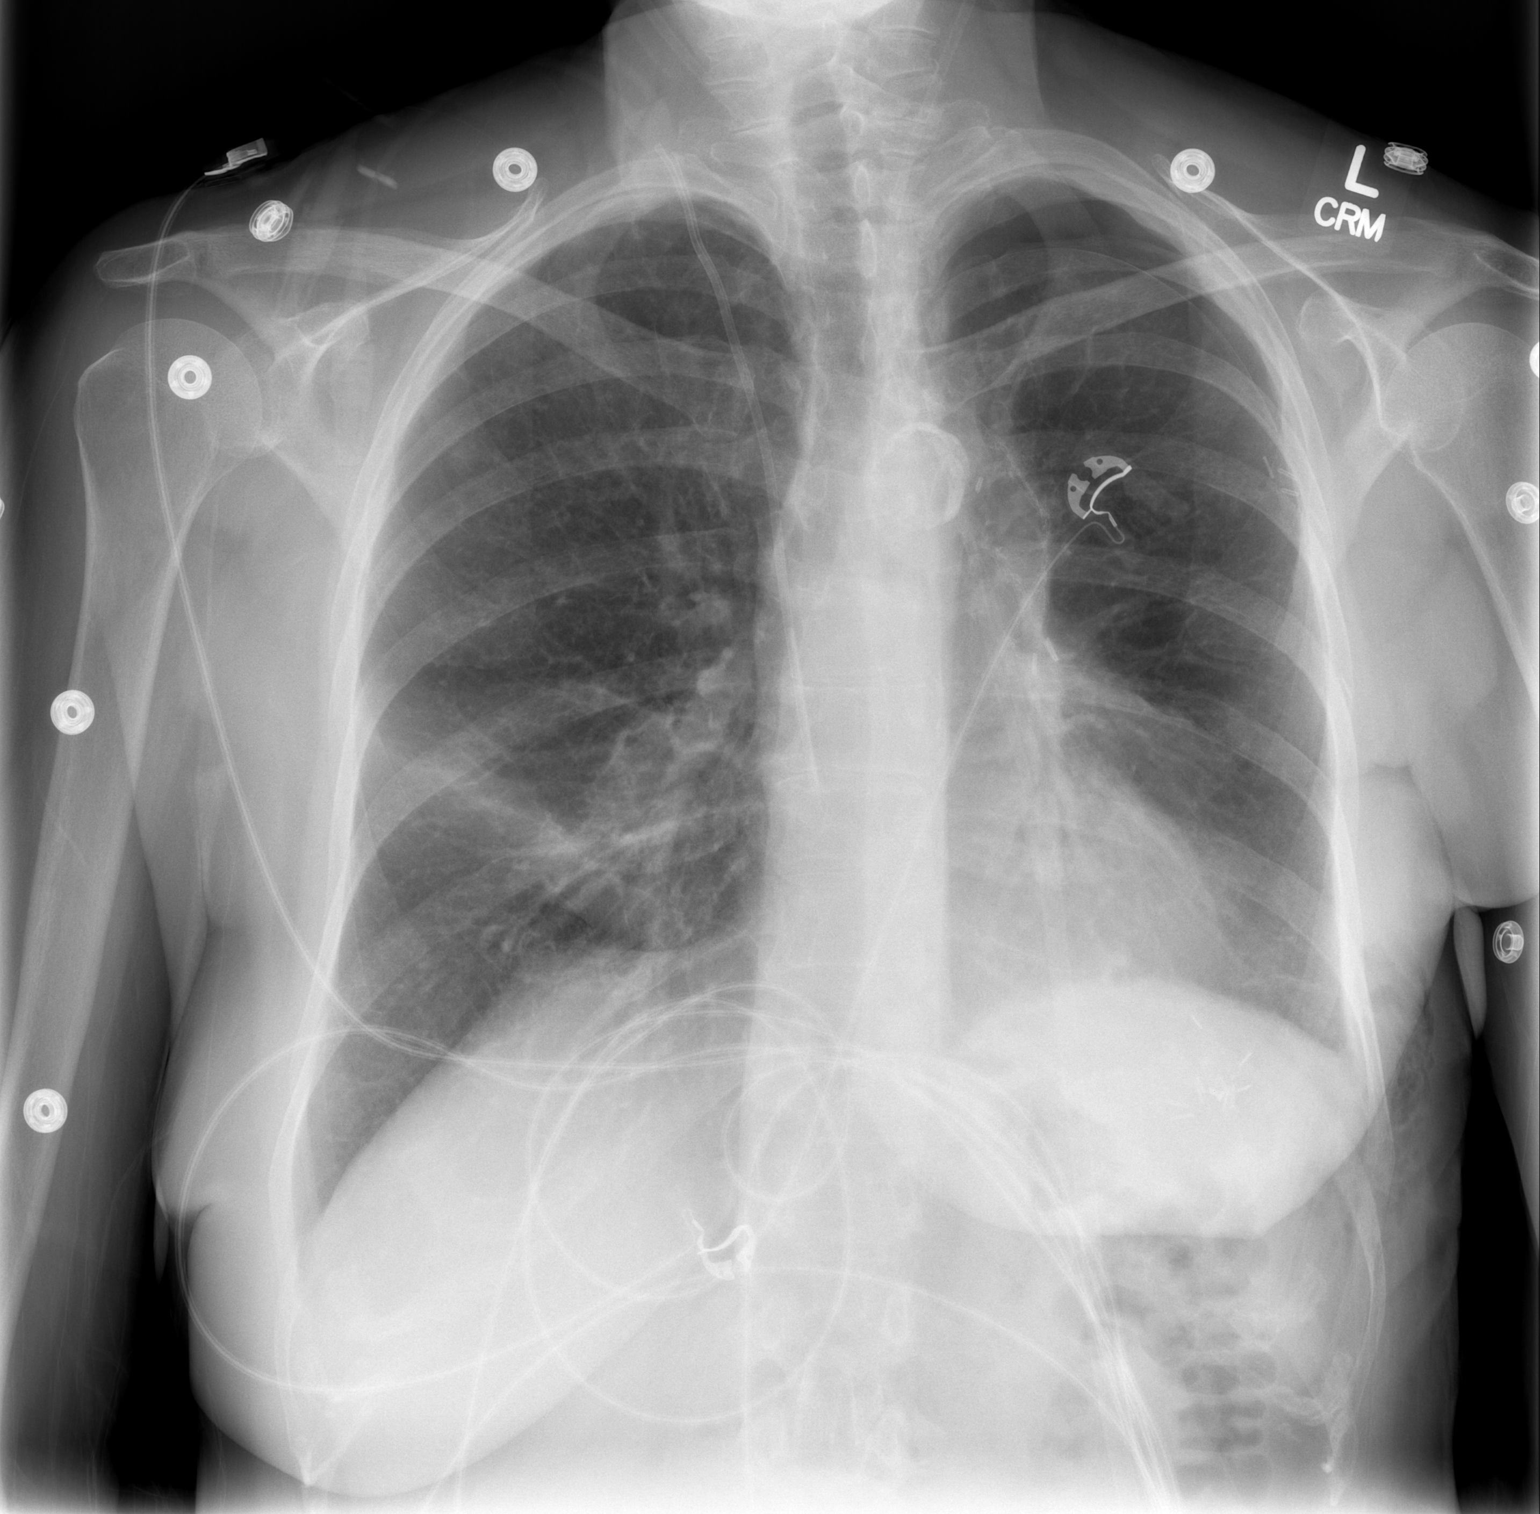

[w chest lat]
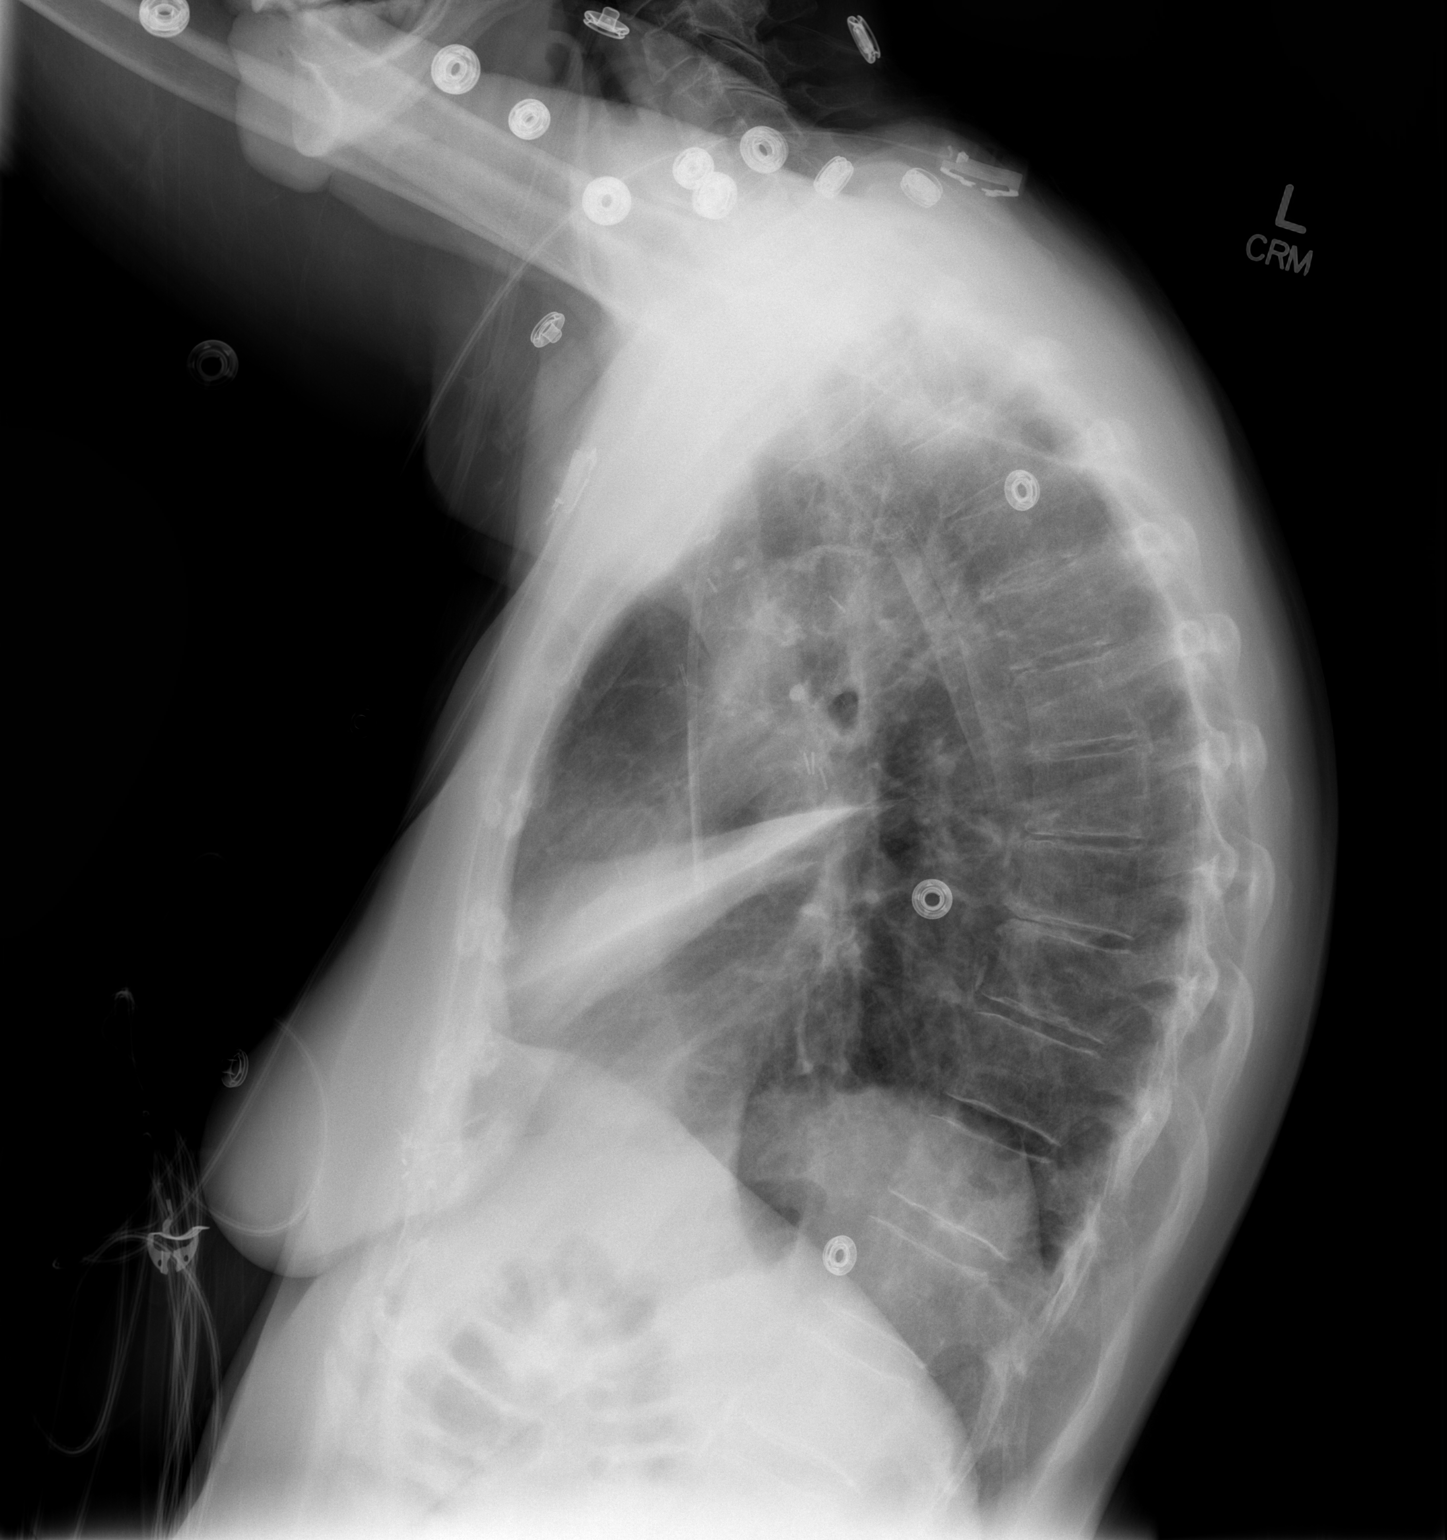

[2 of 2 positions shown; findings below may reference images not displayed]

FINDINGS: Trachea is midline.  Heart size normal.  Postoperative
changes on the left hemithorax with associated volume loss.  Small
left apical pneumothorax has increased in size after left chest
tube removal.  Right basilar atelectasis.  Right IJ central line
tip projects over the SVC.  There may be a tiny left pleural
effusion.  Subcutaneous emphysema along the lower left lateral
chest wall is again noted.
IMPRESSION: 1.  Small left apical pneumothorax, slightly increased in size
after left chest tube removal.
2.  Postoperative changes and volume loss in the left hemithorax.
3.  Right basilar atelectasis.
